# Patient Record
Sex: Female | Born: 1955 | Race: White | Hispanic: No | Marital: Single | Smoking: Never smoker
Health system: Southern US, Community
[De-identification: ages and names within clinical notes are randomized; demographics above are authoritative.]

## PROBLEM LIST (undated history)

## (undated) DIAGNOSIS — F329 Major depressive disorder, single episode, unspecified: Secondary | ICD-10-CM

## (undated) DIAGNOSIS — F32A Depression, unspecified: Secondary | ICD-10-CM

## (undated) DIAGNOSIS — F419 Anxiety disorder, unspecified: Secondary | ICD-10-CM

## (undated) DIAGNOSIS — I10 Essential (primary) hypertension: Secondary | ICD-10-CM

## (undated) DIAGNOSIS — E119 Type 2 diabetes mellitus without complications: Secondary | ICD-10-CM

## (undated) DIAGNOSIS — M1990 Unspecified osteoarthritis, unspecified site: Secondary | ICD-10-CM

## (undated) HISTORY — PX: TUBAL LIGATION: SHX77

## (undated) HISTORY — PX: NASAL SEPTUM SURGERY: SHX37

---

## 2005-12-04 ENCOUNTER — Emergency Department (HOSPITAL_COMMUNITY): Admission: EM | Admit: 2005-12-04 | Discharge: 2005-12-04 | Payer: Self-pay | Admitting: Emergency Medicine

## 2006-06-08 ENCOUNTER — Ambulatory Visit: Payer: Self-pay | Admitting: Family Medicine

## 2006-06-22 ENCOUNTER — Ambulatory Visit: Payer: Self-pay | Admitting: Family Medicine

## 2007-09-05 ENCOUNTER — Encounter: Admission: RE | Admit: 2007-09-05 | Discharge: 2007-12-04 | Payer: Self-pay

## 2008-08-27 ENCOUNTER — Ambulatory Visit: Payer: Self-pay | Admitting: Gastroenterology

## 2011-02-06 ENCOUNTER — Emergency Department (HOSPITAL_COMMUNITY)
Admission: EM | Admit: 2011-02-06 | Discharge: 2011-02-06 | Disposition: A | Discharge status: Home / Self Care | Payer: Managed Care, Other (non HMO) | Attending: Emergency Medicine | Admitting: Emergency Medicine

## 2011-02-06 ENCOUNTER — Emergency Department (HOSPITAL_COMMUNITY): Payer: Managed Care, Other (non HMO)

## 2011-02-06 DIAGNOSIS — M25569 Pain in unspecified knee: Secondary | ICD-10-CM | POA: Insufficient documentation

## 2011-02-06 DIAGNOSIS — M545 Low back pain, unspecified: Secondary | ICD-10-CM | POA: Insufficient documentation

## 2011-02-06 DIAGNOSIS — E119 Type 2 diabetes mellitus without complications: Secondary | ICD-10-CM | POA: Insufficient documentation

## 2011-02-06 DIAGNOSIS — R209 Unspecified disturbances of skin sensation: Secondary | ICD-10-CM | POA: Insufficient documentation

## 2011-02-06 DIAGNOSIS — Q762 Congenital spondylolisthesis: Secondary | ICD-10-CM | POA: Insufficient documentation

## 2011-02-06 DIAGNOSIS — R29898 Other symptoms and signs involving the musculoskeletal system: Secondary | ICD-10-CM | POA: Insufficient documentation

## 2011-10-08 ENCOUNTER — Emergency Department (HOSPITAL_COMMUNITY)
Admission: EM | Admit: 2011-10-08 | Discharge: 2011-10-09 | Disposition: A | Discharge status: Other Acute Inpatient Hospital | Payer: Self-pay | Attending: Emergency Medicine | Admitting: Emergency Medicine

## 2011-10-08 DIAGNOSIS — E119 Type 2 diabetes mellitus without complications: Secondary | ICD-10-CM | POA: Insufficient documentation

## 2011-10-08 DIAGNOSIS — R45851 Suicidal ideations: Secondary | ICD-10-CM | POA: Insufficient documentation

## 2011-10-08 DIAGNOSIS — F411 Generalized anxiety disorder: Secondary | ICD-10-CM | POA: Insufficient documentation

## 2011-10-08 LAB — RAPID URINE DRUG SCREEN, HOSP PERFORMED
Amphetamines: NOT DETECTED
Barbiturates: NOT DETECTED
Benzodiazepines: NOT DETECTED
Cocaine: NOT DETECTED
Opiates: NOT DETECTED
Tetrahydrocannabinol: NOT DETECTED

## 2011-10-08 LAB — BASIC METABOLIC PANEL
BUN: 11 mg/dL (ref 6–23)
Chloride: 96 mEq/L (ref 96–112)
Creatinine, Ser: <0.47 mg/dL — ABNORMAL LOW (ref 0.50–1.10)
Glucose, Bld: 186 mg/dL — ABNORMAL HIGH (ref 70–99)

## 2011-10-08 LAB — DIFFERENTIAL
Basophils Relative: 1 % (ref 0–1)
Eosinophils Absolute: 0 10*3/uL (ref 0.0–0.7)
Lymphs Abs: 1.4 10*3/uL (ref 0.7–4.0)
Monocytes Absolute: 0.3 10*3/uL (ref 0.1–1.0)
Monocytes Relative: 7 % (ref 3–12)

## 2011-10-08 LAB — ACETAMINOPHEN LEVEL: Acetaminophen (Tylenol), Serum: <15 ug/mL (ref 10–30)

## 2011-10-08 LAB — CBC
Hemoglobin: 13.3 g/dL (ref 12.0–15.0)
MCH: 29.6 pg (ref 26.0–34.0)
MCHC: 34.9 g/dL (ref 30.0–36.0)
MCV: 84.9 fL (ref 78.0–100.0)

## 2011-10-09 ENCOUNTER — Inpatient Hospital Stay (HOSPITAL_COMMUNITY)
Admission: AD | Admit: 2011-10-09 | Discharge: 2011-10-13 | DRG: 885 | Disposition: A | Discharge status: Home / Self Care | Payer: 59 | Source: Ambulatory Visit | Attending: Psychiatry | Admitting: Psychiatry

## 2011-10-09 DIAGNOSIS — F339 Major depressive disorder, recurrent, unspecified: Principal | ICD-10-CM

## 2011-10-09 DIAGNOSIS — Z560 Unemployment, unspecified: Secondary | ICD-10-CM

## 2011-10-09 DIAGNOSIS — F411 Generalized anxiety disorder: Secondary | ICD-10-CM

## 2011-10-09 DIAGNOSIS — M1990 Unspecified osteoarthritis, unspecified site: Secondary | ICD-10-CM

## 2011-10-09 DIAGNOSIS — I10 Essential (primary) hypertension: Secondary | ICD-10-CM

## 2011-10-09 DIAGNOSIS — E119 Type 2 diabetes mellitus without complications: Secondary | ICD-10-CM

## 2011-10-09 DIAGNOSIS — R45851 Suicidal ideations: Secondary | ICD-10-CM

## 2011-10-09 DIAGNOSIS — Z79899 Other long term (current) drug therapy: Secondary | ICD-10-CM

## 2011-10-09 LAB — GLUCOSE, CAPILLARY

## 2011-10-10 DIAGNOSIS — F339 Major depressive disorder, recurrent, unspecified: Secondary | ICD-10-CM

## 2011-10-10 DIAGNOSIS — F411 Generalized anxiety disorder: Secondary | ICD-10-CM

## 2011-10-10 LAB — GLUCOSE, CAPILLARY: Glucose-Capillary: 205 mg/dL — ABNORMAL HIGH (ref 70–99)

## 2011-10-11 LAB — GLUCOSE, CAPILLARY
Glucose-Capillary: 199 mg/dL — ABNORMAL HIGH (ref 70–99)
Glucose-Capillary: 266 mg/dL — ABNORMAL HIGH (ref 70–99)

## 2011-10-12 LAB — GLUCOSE, CAPILLARY
Glucose-Capillary: 203 mg/dL — ABNORMAL HIGH (ref 70–99)
Glucose-Capillary: 258 mg/dL — ABNORMAL HIGH (ref 70–99)

## 2011-10-12 NOTE — Assessment & Plan Note (Signed)
Jaclyn [name], [name] NO.:  [id number]  MEDICAL RECORD NO.:  [id number]  LOCATION:  0503                          FACILITY:  [facility]  PHYSICIAN:  Franchot Gallo, MD     DATE OF BIRTH:  11-09-1956  DATE OF ADMISSION:  10/09/2011 DATE OF DISCHARGE:                      PSYCHIATRIC ADMISSION ASSESSMENT   IDENTIFYING INFORMATION:  This is a voluntary admission to the services of Dr. Harvie Heck Jaclyn [name].  This is a 55 year old divorced white female. She presented to the ED at [facility].  She reported that she had started having an anxiety attack the day before.  She becomes very violent.  She tore up a phone yesterday.  She also feels like her heart is racing.  She stated, "I think of suicide a lot."  The patient took 3 tabs of her fluoxetine yesterday, but she is prescribed 60 mg a day, and 4 this morning.  Apparently, her father died on 09/24/23 from Huntington's disease, and she was laid off from her job June 28th.  She has been unable to secure any more employment and just feels cornered.  PAST PSYCHIATRIC HISTORY:  She has had no prior inpatient history.  Her primary care gives her the Prozac, and she does have an intake scheduled with [facility], but it is not until January.  SOCIAL HISTORY:  She has an associate's degree.  She has been married and divorced twice.  She has 1 daughter 78.  She just lost her employment June 25, 2011.  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  FAMILY HISTORY:  Psychiatrically, she denies.  She is still grieving her father's death on Sep 27, 2024from Huntington's.  ALCOHOL AND DRUG HISTORY:  She denies.  PRIMARY CARE PROVIDER:  Dr. Collins Scotland.  MEDICAL PROBLEMS:  She was diagnosed with type 2 diabetes about 2-3 years ago, and she also has hypertension.  CURRENTLY PRESCRIBED MEDICATIONS: 1. Metformin 500 mg p.o. daily. 2. Fluoxetine 60 mg p.o. daily. 3. Enalapril maleate 10/25 mg, 1 p.o. daily for  hypertension.  ALLERGIES:  No known drug allergies.  PHYSICAL EXAMINATION:  GENERAL:  She is a petite female who appears anxious. VITAL SIGNS/LABORATORIES:  Her vital signs showed that her temperature was afebrile, 97.3 to 98.6.  Her respirations were 16-20.  Her pulse was 69-95, and her blood pressure was 118/75 to 149/88.  She had no abnormalities of CBC.  Her B-met showed her glucose was elevated at 186, and her creatinine was a little bit low at 0.47.  She had no alcohol and no drugs in her urine.  MENTAL STATUS EXAM:  Tonight she is alert and oriented.  She is casually groomed and dressed.  Her speech was not pressured.  Her mood is anxiously depressed.  Her affect is congruent.  Her thought processes are clear, rational, goal oriented.  She realizes that it does not make any sense to be tearing up things when you cannot afford to replace them in the first place.  Concentration and memory are superficially intact. Intelligence is at least average.  She denies being suicidal or homicidal.  She denies auditory or visual hallucinations.  She is just very frustrated.  She  just feels out of control since she cannot find a job.  DIAGNOSES:  Axis I: 1. Major depressive disorder, single episode, severe, without     psychotic features. 2. Bereavement, father's death Sep 20, 2023. Axis II:  Deferred. Axis III:  Diabetes, hypertension, osteoarthritis. Axis IV:  Loss of employment and loss of father. Axis V:  GAF 30.  PLAN:  Admit for safety and stabilization.  Her medications will be adjusted as indicated, and we will have the case manager help identify community supports so her lights do not get turned off and things like this.  Estimated length of stay is 5 days.     Mickie Leonarda Salon, P.A.-C.   ______________________________ Franchot Gallo, MD    MD/MEDQ  D:  10/09/2011  T:  10/09/2011  Job:  [id number]  Electronically Signed by Jaci Lazier ADAMS P.A.-C. on 10/12/2011 11:50:54  AM Electronically Signed by Franchot Gallo MD on 10/12/2011 01:25:24 PM

## 2011-10-14 ENCOUNTER — Emergency Department (HOSPITAL_COMMUNITY)
Admission: EM | Admit: 2011-10-14 | Discharge: 2011-10-15 | Disposition: A | Discharge status: Home / Self Care | Payer: Managed Care, Other (non HMO) | Attending: Emergency Medicine | Admitting: Emergency Medicine

## 2011-10-14 DIAGNOSIS — R1031 Right lower quadrant pain: Secondary | ICD-10-CM | POA: Insufficient documentation

## 2011-10-14 DIAGNOSIS — F411 Generalized anxiety disorder: Secondary | ICD-10-CM | POA: Insufficient documentation

## 2011-10-14 DIAGNOSIS — M1990 Unspecified osteoarthritis, unspecified site: Secondary | ICD-10-CM | POA: Insufficient documentation

## 2011-10-14 DIAGNOSIS — N390 Urinary tract infection, site not specified: Secondary | ICD-10-CM | POA: Insufficient documentation

## 2011-10-14 DIAGNOSIS — K625 Hemorrhage of anus and rectum: Secondary | ICD-10-CM | POA: Insufficient documentation

## 2011-10-14 DIAGNOSIS — E119 Type 2 diabetes mellitus without complications: Secondary | ICD-10-CM | POA: Insufficient documentation

## 2011-10-14 DIAGNOSIS — R1032 Left lower quadrant pain: Secondary | ICD-10-CM | POA: Insufficient documentation

## 2011-10-15 LAB — DIFFERENTIAL
Basophils Absolute: 0 10*3/uL (ref 0.0–0.1)
Eosinophils Absolute: 0.1 10*3/uL (ref 0.0–0.7)
Eosinophils Relative: 1 % (ref 0–5)
Neutrophils Relative %: 66 % (ref 43–77)

## 2011-10-15 LAB — URINE MICROSCOPIC-ADD ON

## 2011-10-15 LAB — BASIC METABOLIC PANEL
Chloride: 95 mEq/L — ABNORMAL LOW (ref 96–112)
GFR calc Af Amer: >90 mL/min (ref >90)
Potassium: 3.4 mEq/L — ABNORMAL LOW (ref 3.5–5.1)
Sodium: 136 mEq/L (ref 135–145)

## 2011-10-15 LAB — URINALYSIS, ROUTINE W REFLEX MICROSCOPIC
Glucose, UA: 500 mg/dL — AB
Hgb urine dipstick: NEGATIVE
Protein, ur: NEGATIVE mg/dL
Specific Gravity, Urine: 1.016 (ref 1.005–1.030)
pH: 6 (ref 5.0–8.0)

## 2011-10-15 LAB — CBC
Platelets: 239 10*3/uL (ref 150–400)
RDW: 12.4 % (ref 11.5–15.5)
WBC: 7.9 10*3/uL (ref 4.0–10.5)

## 2011-10-15 LAB — OCCULT BLOOD, POC DEVICE: Fecal Occult Bld: POSITIVE

## 2011-10-15 LAB — GLUCOSE, CAPILLARY: Glucose-Capillary: 192 mg/dL — ABNORMAL HIGH (ref 70–99)

## 2011-10-15 NOTE — Discharge Summary (Signed)
  Jaclyn [name], Jaclyn [name] NO.:  [id number]  MEDICAL RECORD NO.:  [id number]  LOCATION:                                 FACILITY:  PHYSICIAN:  Jaclyn Gallo, MD     DATE OF BIRTH:  07-Mar-1956  DATE OF ADMISSION:  10/09/2011 DATE OF DISCHARGE:  10/13/2011                              DISCHARGE SUMMARY   REASON FOR ADMISSION:  This is a 55 year old female that was admitted for anxiety and thinking of suicide "a lot."  She recently lost her father and was laid off from her job this past summer.  FINAL IMPRESSION:  AXIS I.  Major depressive disorder, recurrent, generalized anxiety disorder. AXIS II.  Deferred. AXIS III.  History of diabetes, hypertension, osteoarthritis. AXIS IV.  Limited primary support, financial constraints, recent loss of father. AXIS V.  GAF at discharge is 70.  LABS:  CBC within normal limits.  Glucose elevated at 186, alcohol level negligible.  Urine drug screen negative.  SIGNIFICANT FINDINGS:  The patient was admitted to the adult milieu for safety and stabilization.  We will provide the patient with resources, monitor her blood sugars and continued her Prozac.  We started the patient on low-dose Klonopin to help her with anxiety.  She was feeling sedated on it, and we decreased the dose to 2.5 b.i.d.  We had contact with her daughter to address any safety issues and for [place] to provide information.  Her daughter had no concerns about her coming home. The patient was reporting her racing thoughts were improving.  She still endorsed problems with decreased energy, hopelessness was rated at 3 on a scale of 1-10.  Denied any suicidal or homicidal thoughts or psychotic symptoms.  She did report that her anxiety was worse over the last 3 months.  On day of discharge, the patient was seen in the interdisciplinary treatment team.  She was fully alert and cooperative, appreciative of the help she received.  She reported good sleep,  good appetite.  Her depression had resolved, rating it a 1 on a scale of 1- 10.  Denied any suicidal or homicidal thoughts or psychotic symptoms. Her anxiety was rated a 2 on a scale of 1-10, hopelessness as 0 on a scale of 1-10 and denied any racing thoughts.  HER DISCHARGE MEDICATIONS:  Included: 1. Klonopin 0.5 mg taking 1/2 a tablet twice a day as needed for     anxiety. 2. Metformin 500 mg 1 daily. 3. Quetiapine 25 mg at bedtime. 4. Enalapril/hydrochlorothiazide 10/25 one daily. 5. Fluoxetine 20 mg taking 3 daily. Her follow up appointment was with at [facility], phone #[phone]. There was a referral made to hospice in [facility], phone [phone].     Jaclyn [name], N.P.   ______________________________ Jaclyn Gallo, MD    JO/MEDQ  D:  10/13/2011  T:  10/14/2011  Job:  [id number]  Electronically Signed by Limmie PatriciaP. on 10/15/2011 09:25:39 AM Electronically Signed by Jaclyn Gallo MD on 10/15/2011 01:10:56 PM

## 2012-11-09 ENCOUNTER — Inpatient Hospital Stay (HOSPITAL_COMMUNITY)
Admission: EM | Admit: 2012-11-09 | Discharge: 2012-11-12 | DRG: 918 | Disposition: A | Discharge status: Other Acute Inpatient Hospital | Payer: MEDICAID | Attending: Internal Medicine | Admitting: Internal Medicine

## 2012-11-09 ENCOUNTER — Encounter (HOSPITAL_COMMUNITY): Payer: Self-pay | Admitting: *Deleted

## 2012-11-09 DIAGNOSIS — E876 Hypokalemia: Secondary | ICD-10-CM | POA: Diagnosis present

## 2012-11-09 DIAGNOSIS — T50901A Poisoning by unspecified drugs, medicaments and biological substances, accidental (unintentional), initial encounter: Secondary | ICD-10-CM

## 2012-11-09 DIAGNOSIS — R9431 Abnormal electrocardiogram [ECG] [EKG]: Secondary | ICD-10-CM

## 2012-11-09 DIAGNOSIS — F32A Depression, unspecified: Secondary | ICD-10-CM

## 2012-11-09 DIAGNOSIS — F3289 Other specified depressive episodes: Secondary | ICD-10-CM | POA: Diagnosis present

## 2012-11-09 DIAGNOSIS — T50911A Poisoning by multiple unspecified drugs, medicaments and biological substances, accidental (unintentional), initial encounter: Secondary | ICD-10-CM | POA: Diagnosis present

## 2012-11-09 DIAGNOSIS — R4589 Other symptoms and signs involving emotional state: Secondary | ICD-10-CM

## 2012-11-09 DIAGNOSIS — E1165 Type 2 diabetes mellitus with hyperglycemia: Secondary | ICD-10-CM

## 2012-11-09 DIAGNOSIS — G249 Dystonia, unspecified: Secondary | ICD-10-CM

## 2012-11-09 DIAGNOSIS — I10 Essential (primary) hypertension: Secondary | ICD-10-CM

## 2012-11-09 DIAGNOSIS — Z79899 Other long term (current) drug therapy: Secondary | ICD-10-CM

## 2012-11-09 DIAGNOSIS — E119 Type 2 diabetes mellitus without complications: Secondary | ICD-10-CM

## 2012-11-09 DIAGNOSIS — M129 Arthropathy, unspecified: Secondary | ICD-10-CM | POA: Diagnosis present

## 2012-11-09 DIAGNOSIS — T43502A Poisoning by unspecified antipsychotics and neuroleptics, intentional self-harm, initial encounter: Secondary | ICD-10-CM | POA: Diagnosis present

## 2012-11-09 DIAGNOSIS — T50904A Poisoning by unspecified drugs, medicaments and biological substances, undetermined, initial encounter: Secondary | ICD-10-CM

## 2012-11-09 DIAGNOSIS — Z23 Encounter for immunization: Secondary | ICD-10-CM

## 2012-11-09 DIAGNOSIS — IMO0001 Reserved for inherently not codable concepts without codable children: Secondary | ICD-10-CM | POA: Diagnosis present

## 2012-11-09 DIAGNOSIS — R259 Unspecified abnormal involuntary movements: Secondary | ICD-10-CM

## 2012-11-09 DIAGNOSIS — T43224A Poisoning by selective serotonin reuptake inhibitors, undetermined, initial encounter: Secondary | ICD-10-CM | POA: Diagnosis present

## 2012-11-09 DIAGNOSIS — F329 Major depressive disorder, single episode, unspecified: Secondary | ICD-10-CM | POA: Diagnosis present

## 2012-11-09 DIAGNOSIS — IMO0002 Reserved for concepts with insufficient information to code with codable children: Secondary | ICD-10-CM

## 2012-11-09 DIAGNOSIS — T50902A Poisoning by unspecified drugs, medicaments and biological substances, intentional self-harm, initial encounter: Secondary | ICD-10-CM

## 2012-11-09 DIAGNOSIS — G2402 Drug induced acute dystonia: Secondary | ICD-10-CM | POA: Diagnosis present

## 2012-11-09 DIAGNOSIS — T68XXXA Hypothermia, initial encounter: Secondary | ICD-10-CM | POA: Diagnosis present

## 2012-11-09 DIAGNOSIS — R410 Disorientation, unspecified: Secondary | ICD-10-CM

## 2012-11-09 DIAGNOSIS — X31XXXA Exposure to excessive natural cold, initial encounter: Secondary | ICD-10-CM | POA: Diagnosis present

## 2012-11-09 DIAGNOSIS — F19921 Other psychoactive substance use, unspecified with intoxication with delirium: Secondary | ICD-10-CM | POA: Diagnosis present

## 2012-11-09 DIAGNOSIS — T43501A Poisoning by unspecified antipsychotics and neuroleptics, accidental (unintentional), initial encounter: Principal | ICD-10-CM | POA: Diagnosis present

## 2012-11-09 HISTORY — DX: Other symptoms and signs involving emotional state: R45.89

## 2012-11-09 HISTORY — DX: Type 2 diabetes mellitus without complications: E11.9

## 2012-11-09 HISTORY — DX: Abnormal electrocardiogram (ECG) (EKG): R94.31

## 2012-11-09 HISTORY — DX: Unspecified osteoarthritis, unspecified site: M19.90

## 2012-11-09 HISTORY — DX: Essential (primary) hypertension: I10

## 2012-11-09 HISTORY — DX: Depression, unspecified: F32.A

## 2012-11-09 HISTORY — DX: Major depressive disorder, single episode, unspecified: F32.9

## 2012-11-09 LAB — RAPID URINE DRUG SCREEN, HOSP PERFORMED
Benzodiazepines: NOT DETECTED
Cocaine: NOT DETECTED
Opiates: NOT DETECTED

## 2012-11-09 LAB — BASIC METABOLIC PANEL
Chloride: 98 mEq/L (ref 96–112)
Creatinine, Ser: 0.72 mg/dL (ref 0.50–1.10)
GFR calc Af Amer: >90 mL/min (ref >90)
Potassium: 3.4 mEq/L — ABNORMAL LOW (ref 3.5–5.1)
Sodium: 137 mEq/L (ref 135–145)

## 2012-11-09 LAB — MAGNESIUM: Magnesium: 1.8 mg/dL (ref 1.5–2.5)

## 2012-11-09 LAB — CBC WITH DIFFERENTIAL/PLATELET
Basophils Absolute: 0 10*3/uL (ref 0.0–0.1)
HCT: 34 % — ABNORMAL LOW (ref 36.0–46.0)
Hemoglobin: 11.2 g/dL — ABNORMAL LOW (ref 12.0–15.0)
Lymphocytes Relative: 7 % — ABNORMAL LOW (ref 12–46)
Monocytes Absolute: 0.7 10*3/uL (ref 0.1–1.0)
Neutro Abs: 12.8 10*3/uL — ABNORMAL HIGH (ref 1.7–7.7)
RDW: 12.9 % (ref 11.5–15.5)
WBC: 14.5 10*3/uL — ABNORMAL HIGH (ref 4.0–10.5)

## 2012-11-09 LAB — URINALYSIS, ROUTINE W REFLEX MICROSCOPIC
Leukocytes, UA: NEGATIVE
Nitrite: NEGATIVE
Specific Gravity, Urine: >1.03 — ABNORMAL HIGH (ref 1.005–1.030)
pH: 5.5 (ref 5.0–8.0)

## 2012-11-09 LAB — GLUCOSE, CAPILLARY: Glucose-Capillary: 335 mg/dL — ABNORMAL HIGH (ref 70–99)

## 2012-11-09 MED ORDER — LORAZEPAM 2 MG/ML IJ SOLN
INTRAMUSCULAR | Status: AC
  Filled 2012-11-09: qty 1

## 2012-11-09 MED ORDER — SODIUM CHLORIDE 0.9 % IV SOLN
INTRAVENOUS | Status: DC
  Administered 2012-11-10: 01:00:00 via INTRAVENOUS
  Filled 2012-11-09 (×6): qty 1000

## 2012-11-09 MED ORDER — MAGNESIUM SULFATE 40 MG/ML IJ SOLN
2.0000 g | Freq: Once | INTRAMUSCULAR | Status: AC
  Administered 2012-11-10: 2 g via INTRAVENOUS
  Filled 2012-11-09: qty 50

## 2012-11-09 MED ORDER — POTASSIUM CHLORIDE IN NACL 20-0.9 MEQ/L-% IV SOLN
INTRAVENOUS | Status: AC
  Administered 2012-11-10: 1000 mL
  Filled 2012-11-09: qty 1000

## 2012-11-09 MED ORDER — LORAZEPAM 2 MG/ML IJ SOLN
1.0000 mg | Freq: Once | INTRAMUSCULAR | Status: AC
  Administered 2012-11-09: 1 mg via INTRAVENOUS

## 2012-11-09 MED ORDER — IBUPROFEN 800 MG PO TABS
800.0000 mg | ORAL_TABLET | Freq: Once | ORAL | Status: AC
  Administered 2012-11-09: 800 mg via ORAL
  Filled 2012-11-09: qty 1

## 2012-11-09 MED ORDER — SODIUM CHLORIDE 0.9 % IV SOLN
1000.0000 mL | INTRAVENOUS | Status: DC
  Administered 2012-11-09: 1000 mL via INTRAVENOUS

## 2012-11-09 MED ORDER — SODIUM CHLORIDE 0.9 % IV SOLN
1000.0000 mL | Freq: Once | INTRAVENOUS | Status: AC
  Administered 2012-11-09: 1000 mL via INTRAVENOUS

## 2012-11-09 MED ORDER — LORAZEPAM 2 MG/ML IJ SOLN
1.0000 mg | Freq: Once | INTRAMUSCULAR | Status: AC
  Administered 2012-11-09: 1 mg via INTRAVENOUS
  Filled 2012-11-09: qty 1

## 2012-11-09 MED ORDER — SODIUM CHLORIDE 0.9 % IV BOLUS (SEPSIS)
1000.0000 mL | Freq: Once | INTRAVENOUS | Status: AC
  Administered 2012-11-10: 1000 mL via INTRAVENOUS

## 2012-11-09 MED ORDER — POTASSIUM CHLORIDE 10 MEQ/100ML IV SOLN
10.0000 meq | INTRAVENOUS | Status: AC
  Administered 2012-11-09 – 2012-11-10 (×2): 10 meq via INTRAVENOUS
  Filled 2012-11-09 (×2): qty 100

## 2012-11-09 MED ORDER — LORAZEPAM 2 MG/ML IJ SOLN
2.0000 mg | Freq: Once | INTRAMUSCULAR | Status: AC
  Administered 2012-11-09: 2 mg via INTRAVENOUS

## 2012-11-09 MED ORDER — LORAZEPAM 2 MG/ML IJ SOLN: 2.0000 mg | Freq: Once | INTRAMUSCULAR | Status: DC

## 2012-11-09 NOTE — ED Notes (Signed)
Pt placed under bear hugger to increase body temp & warm IV fluids started.

## 2012-11-09 NOTE — ED Notes (Signed)
Reported pt took a handful of Prozac & Seroquel around 1600 this afternoon & then when into the woods. EMS states pt pale & cool when they got to pt. Pt warmed & color returned. Pt denies ETOH.

## 2012-11-09 NOTE — ED Notes (Signed)
Pt is restless & anxious. States something is wrong but does not know what.

## 2012-11-09 NOTE — ED Notes (Signed)
Pt remains on cardiac monitor w/ NIBP vital signs WNL. NAD noted. No needs voiced at this time. 

## 2012-11-09 NOTE — BH Assessment (Signed)
Assessment Note   Jaclyn [name] is an 56 y.o. female. ACT called to evaluate pt who presented with an intentional overdose with admitted suicidal intent.  When ACT arrived around 2215, pt was very restless and somewhat incoherent.  ACT unable to obtain information from pt.  Dr Patria Mane of [facility] came back to evaluate pt and said that she was much more coherent when she first came to ED earlier tonight. Pt's daughter, Charlynne Cousins was contacted and provided all the information on this assessment.  She reported that she lives with pt and they have been under pretty significant financial strain for quite some time now.  Tonight pt became upset about these financial issues and told Ciara that she was "done" and proceeded to take roughly 20 seroquel tablets and between 15 and 20 prozac.  Pt then left the home.  Daughter called 911 and pt was located 2 hours later in the woods and brought to [facility].  Daughter reports pt has seemed depressed for a few months lately.  Pt does go to [facility] for meds.  In august a med change was made: prozac was stopped and effexor was started due to pt feeling to drowsy during the day.  No reported use of drugs or alcohol.  UDS/BAC both negative.  Pt was hospitalized in 09/2011 at [facility] for depression/SI. Daughter reports this was first psych admit for pt.  Axis I: Major Depression, Recurrent severe Axis II: Deferred Axis III:  Past Medical History  Diagnosis Date  . Diabetes mellitus without complication   . Hypertension   . Arthritis   . Depression    Axis IV: economic problems Axis V: 21-30 behavior considerably influenced by delusions or hallucinations OR serious impairment in judgment, communication OR inability to function in almost all areas  Past Medical History:  Past Medical History  Diagnosis Date  . Diabetes mellitus without complication   . Hypertension   . Arthritis   . Depression     Past Surgical History  Procedure Date  . Tubal ligation   . Nasal  septum surgery     Family History: No family history on file.  Social History:  reports that she has never smoked. She does not have any smokeless tobacco history on file. She reports that she does not drink alcohol or use illicit drugs.  Additional Social History:  Alcohol / Drug Use Pain Medications: Daughter denies any drug or alcohol use by pt.  UDS/BAC both negative. History of alcohol / drug use?: No history of alcohol / drug abuse  CIWA: CIWA-Ar BP: 151/65 mmHg Pulse Rate: 139  COWS:    Allergies:  Allergies  Allergen Reactions  . Codeine Nausea And Vomiting    States that she cannot take in high doses  . Morphine And Related Nausea And Vomiting    States that she cannot take in high doses    Home Medications:  (Not in a hospital admission)  OB/GYN Status:  No LMP recorded. Patient is postmenopausal.  General Assessment Data Location of Assessment: [facility] ACT Assessment: Yes Living Arrangements: Children Can pt return to current living arrangement?: Yes     Risk to self Suicidal Ideation: Yes-Currently Present Suicidal Intent: Yes-Currently Present Is patient at risk for suicide?: Yes Suicidal Plan?: Yes-Currently Present Specify Current Suicidal Plan: overdose Access to Means: Yes Specify Access to Suicidal Means: own pills What has been your use of drugs/alcohol within the last 12 months?: no current use Previous Attempts/Gestures: No Intentional Self Injurious Behavior:  None Family Suicide History: No Recent stressful life event(s): Financial Problems Persecutory voices/beliefs?: No Depression: Yes Depression Symptoms:  (unable to assess at this time) Substance abuse history and/or treatment for substance abuse?: No Suicide prevention information given to non-admitted patients: Not applicable  Risk to Others Homicidal Ideation: No Thoughts of Harm to Others: No Current Homicidal Intent: No Current Homicidal Plan: No Access to Homicidal Means:  No History of harm to others?: No Assessment of Violence: None Noted Does patient have access to weapons?: No Criminal Charges Pending?: No Does patient have a court date: No  Psychosis Hallucinations: None noted Delusions: None noted  Mental Status Report Appear/Hygiene: Disheveled Eye Contact: Poor Motor Activity: Restlessness Speech: Soft;Incoherent Level of Consciousness: Restless Mood:  (unable to assess) Affect: Unable to Assess Anxiety Level: Moderate Thought Processes:  (unable to assess) Orientation: Unable to assess  Cognitive Functioning Appetite: Poor Weight Loss: 10  Weight Gain: 0  Sleep: No Change Vegetative Symptoms: None     Abuse/Neglect [facility]) Physical Abuse: Denies Verbal Abuse: Denies Sexual Abuse: Denies  Prior Inpatient Therapy Prior Inpatient Therapy: Yes Prior Therapy Dates: 09/2011 Prior Therapy Facilty/Provider(s): [facility] Reason for Treatment: psych  Prior Outpatient Therapy Prior Outpatient Therapy: Yes Prior Therapy Dates: current Prior Therapy Facilty/Provider(s): [facility] Reason for Treatment: meds/psych          Abuse/Neglect Assessment (Assessment to be complete while patient is alone) Physical Abuse: Denies Verbal Abuse: Denies Sexual Abuse: Denies Values / Beliefs Cultural Requests During Hospitalization: None Spiritual Requests During Hospitalization: None   Advance Directives (For Healthcare) Advance Directive: Patient does not have advance directive    Additional Information 1:1 In Past 12 Months?: No Does patient have medical clearance?: No     Disposition: Discussed pt with Dr Patria Mane of [facility].  Pt mental status has deteriorated since she first presented in the ED and Dr Patria Mane is going to further evaluate pt for medical clearance at this time.  Pt will need psych admit once clear medically.    On Site Evaluation by:   Reviewed with Physician:     Lorri Frederick 11/09/2012 10:53 PM

## 2012-11-09 NOTE — ED Notes (Signed)
Pt states she has been having a hard time at home. States she is unable to work, and may only get 1 meal a day. Pt states she is in pain from chronic conditions. States she thinks she did take medication in an attempt to harm self.

## 2012-11-09 NOTE — ED Notes (Signed)
Pt complaining of her back hurting. EDP notified.

## 2012-11-09 NOTE — ED Provider Notes (Signed)
History   This chart was scribed for Lyanne Co, MD by Sofie Rower, ED Scribe. The patient was seen in room [facility] and the patient's care was started at 7:43PM.     CSN: [id number]  Arrival date & time 11/09/12  4098   First MD Initiated Contact with Patient 11/09/12 1943      Chief Complaint  Patient presents with  . Drug Overdose  . Back Pain     The history is provided by the patient.    Jaclyn [name] is a 56 y.o. female , with a hx of anxiety and depression, who presents to the Emergency Department complaining of sudden, progressively worsening, suicidal ideation, onset today (11/09/12). The pt reports she has been going through some tough times lately and was trying to killer herself earlier this evening (11/09/12), by taking a handful of Prozac and Seroquel at 4:00PM. The pt has been placed within a behavioral health facility in the past, with regards to her anxiety related destructive behaviors.  The pt denies drinking any alcohol this evening.   The pt does not smoke or drink alcohol.   PCP is Dr. Collins Scotland.    Past Medical History  Diagnosis Date  . Diabetes mellitus without complication   . Hypertension   . Arthritis   . Depression     Past Surgical History  Procedure Date  . Tubal ligation   . Nasal septum surgery     No family history on file.  History  Substance Use Topics  . Smoking status: Never Smoker   . Smokeless tobacco: Not on file  . Alcohol Use: No    OB History    Grav Para Term Preterm Abortions TAB SAB Ect Mult Living                  Review of Systems  All other systems reviewed and are negative.    Allergies  Codeine and Morphine and related  Home Medications   Current Outpatient Rx  Name  Route  Sig  Dispense  Refill  . ENALAPRIL MALEATE 10 MG PO TABS   Oral   Take 10 mg by mouth daily.         Marland Kitchen FLUOXETINE HCL 20 MG PO CAPS   Oral   Take 20 mg by mouth daily.         . MELOXICAM 15 MG PO TABS   Oral  Take 15 mg by mouth daily. dosage not on bottle         . METFORMIN HCL 1000 MG PO TABS   Oral   Take 1,000 mg by mouth 2 (two) times daily with a meal.         . QUETIAPINE FUMARATE 25 MG PO TABS   Oral   Take 25 mg by mouth at bedtime.         . VENLAFAXINE HCL ER 75 MG PO CP24   Oral   Take 75 mg by mouth daily.           BP 109/67  Pulse 104  Temp 91.7 F (33.2 C) (Rectal)  Resp 19  Ht 5\' 3"  (1.6 m)  Wt 100 lb (45.36 kg)  BMI 17.71 kg/m2  SpO2 100%  Physical Exam  Nursing note and vitals reviewed. Constitutional: She is oriented to person, place, and time. She appears well-developed and well-nourished.  HENT:  Head: Normocephalic.  Eyes: EOM are normal.  Neck: Normal range of motion.  Pulmonary/Chest: Effort normal.  Abdominal: She exhibits no distension.  Musculoskeletal: Normal range of motion.  Neurological: She is alert and oriented to person, place, and time.    ED Course  Procedures (including critical care time)  DIAGNOSTIC STUDIES: Oxygen Saturation is 100% on room air, normal by my interpretation.    COORDINATION OF CARE:  8:19 PM- Treatment plan discussed with patient. Pt agrees with treatment.   9:06PM- Phone consultation with ACT team. Tammy Sours will becoming to evaluate patient.   10:27PM- Recheck. Pt is becoming more altered and tachycardic. Will contact Poison control at this time.   10:33PM- Phone consultation with Poison control. Advised that the symptoms are associated with overdose of Seroquel. Pt to be medically admitted.    10:53PM- Phone consultation with Dr. Vedia Coffer, Hospitalist. Pt hx and condition as well as hospital admission discussed. Dr. Orvan Falconer agrees to admit patient.   CRITICAL CARE Performed by: Lyanne Co Total critical care time: 32 Critical care time was exclusive of separately billable procedures and treating other patients. Critical care was necessary to treat or prevent imminent or life-threatening  deterioration. Critical care was time spent personally by me on the following activities: development of treatment plan with patient and/or surrogate as well as nursing, discussions with consultants, evaluation of patient's response to treatment, examination of patient, obtaining history from patient or surrogate, ordering and performing treatments and interventions, ordering and review of laboratory studies, ordering and review of radiographic studies, pulse oximetry and re-evaluation of patient's condition.   Results for orders placed during the hospital encounter of 11/09/12  BASIC METABOLIC PANEL      Component Value Range   Sodium 137  135 - 145 mEq/L   Potassium 3.4 (*) 3.5 - 5.1 mEq/L   Chloride 98  96 - 112 mEq/L   CO2 22  19 - 32 mEq/L   Glucose, Bld 276 (*) 70 - 99 mg/dL   BUN 24 (*) 6 - 23 mg/dL   Creatinine, Ser 9.52  0.50 - 1.10 mg/dL   Calcium 9.4  8.4 - 84.1 mg/dL   GFR calc non Af Amer >90  >90 mL/min   GFR calc Af Amer >90  >90 mL/min  CBC WITH DIFFERENTIAL      Component Value Range   WBC 14.5 (*) 4.0 - 10.5 K/uL   RBC 3.91  3.87 - 5.11 MIL/uL   Hemoglobin 11.2 (*) 12.0 - 15.0 g/dL   HCT 32.4 (*) 40.1 - 02.7 %   MCV 87.0  78.0 - 100.0 fL   MCH 28.6  26.0 - 34.0 pg   MCHC 32.9  30.0 - 36.0 g/dL   RDW 25.3  66.4 - 40.3 %   Platelets 395  150 - 400 K/uL   Neutrophils Relative 88 (*) 43 - 77 %   Neutro Abs 12.8 (*) 1.7 - 7.7 K/uL   Lymphocytes Relative 7 (*) 12 - 46 %   Lymphs Abs 1.1  0.7 - 4.0 K/uL   Monocytes Relative 5  3 - 12 %   Monocytes Absolute 0.7  0.1 - 1.0 K/uL   Eosinophils Relative 0  0 - 5 %   Eosinophils Absolute 0.0  0.0 - 0.7 K/uL   Basophils Relative 0  0 - 1 %   Basophils Absolute 0.0  0.0 - 0.1 K/uL  URINALYSIS, ROUTINE W REFLEX MICROSCOPIC      Component Value Range   Color, Urine YELLOW  YELLOW   APPearance CLEAR  CLEAR   Specific Gravity, Urine >1.030 (*)  1.005 - 1.030   pH 5.5  5.0 - 8.0   Glucose, UA 250 (*) NEGATIVE mg/dL   Hgb  urine dipstick NEGATIVE  NEGATIVE   Bilirubin Urine SMALL (*) NEGATIVE   Ketones, ur 40 (*) NEGATIVE mg/dL   Protein, ur NEGATIVE  NEGATIVE mg/dL   Urobilinogen, UA 0.2  0.0 - 1.0 mg/dL   Nitrite NEGATIVE  NEGATIVE   Leukocytes, UA NEGATIVE  NEGATIVE  URINE RAPID DRUG SCREEN (HOSP PERFORMED)      Component Value Range   Opiates NONE DETECTED  NONE DETECTED   Cocaine NONE DETECTED  NONE DETECTED   Benzodiazepines NONE DETECTED  NONE DETECTED   Amphetamines NONE DETECTED  NONE DETECTED   Tetrahydrocannabinol NONE DETECTED  NONE DETECTED   Barbiturates NONE DETECTED  NONE DETECTED  ETHANOL      Component Value Range   Alcohol, Ethyl (B) <11  0 - 11 mg/dL  SALICYLATE LEVEL      Component Value Range   Salicylate Lvl <2.0 (*) 2.8 - 20.0 mg/dL  ACETAMINOPHEN LEVEL      Component Value Range   Acetaminophen (Tylenol), Serum <15.0  10 - 30 ug/mL  GLUCOSE, CAPILLARY      Component Value Range   Glucose-Capillary 335 (*) 70 - 99 mg/dL     Date: 40/98/1191  Rate: 137  Rhythm: sinus tachycardia  QRS Axis: normal  Intervals: QTc 570  ST/T Wave abnormalities: normal  Conduction Disutrbances: none  Narrative Interpretation:   Old EKG Reviewed: changed from prior ecg      No results found.   1. Intentional drug overdose       MDM  10:48 PM The patient was to be seen and evaluated by the behavior health team.  However at this time the patient is becoming more agitated now she is tachycardic up in the 130s 140s.  I discussed her case with the [facility] and they recommend medical admission to a telemetry bed for this patient.  He reports that the majority of all of her Effexor secondary to cerebral itself.  They recommended treatment with IV fluids and benzodiazepines.  EKG pending.  10:58 PM QTC is 570.  Poison control's recommending replacement of her potassium at this point and to check her magnesium level.  I personally performed the services described in this  documentation, which was scribed in my presence. The recorded information has been reviewed and is accurate.      Lyanne Co, MD 11/09/12 [phone]

## 2012-11-09 NOTE — ED Notes (Signed)
Patient is not alert at this time. Screaming out, flaying her arms and legs and pulling covers off.

## 2012-11-10 DIAGNOSIS — T50905A Adverse effect of unspecified drugs, medicaments and biological substances, initial encounter: Secondary | ICD-10-CM | POA: Diagnosis present

## 2012-11-10 DIAGNOSIS — R410 Disorientation, unspecified: Secondary | ICD-10-CM | POA: Diagnosis present

## 2012-11-10 DIAGNOSIS — F329 Major depressive disorder, single episode, unspecified: Secondary | ICD-10-CM

## 2012-11-10 DIAGNOSIS — R9431 Abnormal electrocardiogram [ECG] [EKG]: Secondary | ICD-10-CM

## 2012-11-10 DIAGNOSIS — IMO0001 Reserved for inherently not codable concepts without codable children: Secondary | ICD-10-CM

## 2012-11-10 HISTORY — DX: Disorientation, unspecified: R41.0

## 2012-11-10 LAB — COMPREHENSIVE METABOLIC PANEL
Albumin: 2.8 g/dL — ABNORMAL LOW (ref 3.5–5.2)
Albumin: 3 g/dL — ABNORMAL LOW (ref 3.5–5.2)
BUN: 15 mg/dL (ref 6–23)
BUN: 10 mg/dL (ref 6–23)
Calcium: 7.9 mg/dL — ABNORMAL LOW (ref 8.4–10.5)
Chloride: 108 mEq/L (ref 96–112)
Creatinine, Ser: 0.55 mg/dL (ref 0.50–1.10)
GFR calc Af Amer: >90 mL/min (ref >90)
Glucose, Bld: 131 mg/dL — ABNORMAL HIGH (ref 70–99)
Potassium: 4.2 mEq/L (ref 3.5–5.1)
Sodium: 138 mEq/L (ref 135–145)
Total Bilirubin: 0.2 mg/dL — ABNORMAL LOW (ref 0.3–1.2)
Total Protein: 5.6 g/dL — ABNORMAL LOW (ref 6.0–8.3)
Total Protein: 5.8 g/dL — ABNORMAL LOW (ref 6.0–8.3)

## 2012-11-10 LAB — GLUCOSE, CAPILLARY
Glucose-Capillary: 90 mg/dL (ref 70–99)
Glucose-Capillary: 71 mg/dL (ref 70–99)
Glucose-Capillary: 117 mg/dL — ABNORMAL HIGH (ref 70–99)

## 2012-11-10 LAB — CBC
HCT: 26.4 % — ABNORMAL LOW (ref 36.0–46.0)
HCT: 29.9 % — ABNORMAL LOW (ref 36.0–46.0)
MCHC: 33 g/dL (ref 30.0–36.0)
MCHC: 32.4 g/dL (ref 30.0–36.0)
MCV: 86.3 fL (ref 78.0–100.0)
MCV: 86.9 fL (ref 78.0–100.0)
Platelets: 336 10*3/uL (ref 150–400)
RDW: 13.2 % (ref 11.5–15.5)
RDW: 13.2 % (ref 11.5–15.5)
WBC: 8.9 10*3/uL (ref 4.0–10.5)

## 2012-11-10 LAB — APTT: aPTT: 27 seconds (ref 24–37)

## 2012-11-10 LAB — HEMOGLOBIN A1C
Hgb A1c MFr Bld: 7.6 % — ABNORMAL HIGH (ref <5.7)
Mean Plasma Glucose: 171 mg/dL — ABNORMAL HIGH (ref <117)

## 2012-11-10 LAB — MRSA PCR SCREENING: MRSA by PCR: NEGATIVE

## 2012-11-10 LAB — PROTIME-INR: INR: 1.27 (ref 0.00–1.49)

## 2012-11-10 MED ORDER — METOPROLOL TARTRATE 1 MG/ML IV SOLN: 5.0000 mg | INTRAVENOUS | Status: DC | PRN

## 2012-11-10 MED ORDER — ONDANSETRON HCL 4 MG/2ML IJ SOLN: 4.0000 mg | Freq: Four times a day (QID) | INTRAMUSCULAR | Status: DC | PRN

## 2012-11-10 MED ORDER — LORAZEPAM 2 MG/ML IJ SOLN
1.0000 mg | INTRAMUSCULAR | Status: DC | PRN
  Administered 2012-11-10 – 2012-11-12 (×2): 1 mg via INTRAVENOUS
  Filled 2012-11-10 (×2): qty 1

## 2012-11-10 MED ORDER — ACETAMINOPHEN 325 MG PO TABS
650.0000 mg | ORAL_TABLET | ORAL | Status: DC | PRN
  Administered 2012-11-11 – 2012-11-12 (×2): 650 mg via ORAL
  Filled 2012-11-10 (×2): qty 2

## 2012-11-10 MED ORDER — POTASSIUM CHLORIDE IN NACL 20-0.9 MEQ/L-% IV SOLN
INTRAVENOUS | Status: DC
  Administered 2012-11-10: 08:00:00 via INTRAVENOUS
  Administered 2012-11-10: 1000 mL via INTRAVENOUS

## 2012-11-10 MED ORDER — INSULIN ASPART 100 UNIT/ML ~~LOC~~ SOLN
0.0000 [IU] | SUBCUTANEOUS | Status: DC
  Administered 2012-11-10: 1 [IU] via SUBCUTANEOUS
  Administered 2012-11-11 (×3): 2 [IU] via SUBCUTANEOUS
  Administered 2012-11-11 (×2): 1 [IU] via SUBCUTANEOUS
  Administered 2012-11-12: 2 [IU] via SUBCUTANEOUS
  Administered 2012-11-12: 1 [IU] via SUBCUTANEOUS

## 2012-11-10 MED ORDER — SODIUM CHLORIDE 0.9 % IJ SOLN
3.0000 mL | Freq: Two times a day (BID) | INTRAMUSCULAR | Status: DC
  Administered 2012-11-10 – 2012-11-11 (×2): 3 mL via INTRAVENOUS

## 2012-11-10 MED ORDER — ONDANSETRON HCL 4 MG PO TABS: 4.0000 mg | ORAL_TABLET | Freq: Four times a day (QID) | ORAL | Status: DC | PRN

## 2012-11-10 MED ORDER — CHLORHEXIDINE GLUCONATE 0.12 % MT SOLN
15.0000 mL | Freq: Two times a day (BID) | OROMUCOSAL | Status: DC
  Administered 2012-11-10 – 2012-11-12 (×4): 15 mL via OROMUCOSAL
  Filled 2012-11-10 (×4): qty 15

## 2012-11-10 MED ORDER — BISACODYL 10 MG RE SUPP: 10.0000 mg | Freq: Every day | RECTAL | Status: DC | PRN

## 2012-11-10 MED ORDER — ENOXAPARIN SODIUM 40 MG/0.4ML ~~LOC~~ SOLN
40.0000 mg | SUBCUTANEOUS | Status: DC
  Administered 2012-11-10 – 2012-11-12 (×3): 40 mg via SUBCUTANEOUS
  Filled 2012-11-10 (×4): qty 0.4

## 2012-11-10 MED ORDER — FLEET ENEMA 7-19 GM/118ML RE ENEM: 1.0000 | ENEMA | Freq: Once | RECTAL | Status: AC | PRN

## 2012-11-10 MED ORDER — BIOTENE DRY MOUTH MT LIQD
15.0000 mL | Freq: Two times a day (BID) | OROMUCOSAL | Status: DC
  Administered 2012-11-10 – 2012-11-11 (×3): 15 mL via OROMUCOSAL

## 2012-11-10 MED ORDER — ACETAMINOPHEN 650 MG RE SUPP: 650.0000 mg | RECTAL | Status: DC | PRN

## 2012-11-10 MED ORDER — PANTOPRAZOLE SODIUM 40 MG IV SOLR
40.0000 mg | Freq: Every day | INTRAVENOUS | Status: DC
  Administered 2012-11-10 (×2): 40 mg via INTRAVENOUS
  Filled 2012-11-10 (×2): qty 40

## 2012-11-10 MED ORDER — INFLUENZA VIRUS VACC SPLIT PF IM SUSP
0.5000 mL | INTRAMUSCULAR | Status: AC
  Administered 2012-11-11: 0.5 mL via INTRAMUSCULAR
  Filled 2012-11-10: qty 0.5

## 2012-11-10 MED ORDER — PNEUMOCOCCAL VAC POLYVALENT 25 MCG/0.5ML IJ INJ
0.5000 mL | INJECTION | INTRAMUSCULAR | Status: AC
  Administered 2012-11-11: 0.5 mL via INTRAMUSCULAR
  Filled 2012-11-10: qty 0.5

## 2012-11-10 NOTE — Progress Notes (Signed)
Patty called from Poison Control to check on patient status

## 2012-11-10 NOTE — Progress Notes (Signed)
     Subjective: This lady was admitted yesterday with polydrug overdose, mainly Seroquel and Prozac. She has become largely unresponsive with prolonged QTC intervals and sinus tachycardia. Fortunately, her this morning her heart rate is in the 80s and QTC is not as prolonged .She remains largely unresponsive.           Physical Exam: Blood pressure 127/77, pulse 80, temperature 97.6 F (36.4 C), temperature source Axillary, resp. rate 12, height 5\' 3"  (1.6 m), weight 50.3 kg (110 lb 14.3 oz), SpO2 95.00%. She is responsive to painful stimuli and did open the eyes for me. She moves all her limbs. She has hyperreflexia in her legs. Both plantars are upgoing. Heart sounds are present and normal. Lung fields are clear. Abdomen soft without any masses. There is no obvious tenderness.   Investigations:  Recent Results (from the past 240 hour(s))  MRSA PCR SCREENING     Status: Normal   Collection Time   11/10/12  2:41 AM      Component Value Range Status Comment   MRSA by PCR NEGATIVE  NEGATIVE Final      Basic Metabolic Panel:  Basename 11/10/12 0450 11/09/12 2243 11/09/12 2051  NA 138 -- 137  K 4.2 -- 3.4*  CL 109 -- 98  CO2 22 -- 22  GLUCOSE 131* -- 276*  BUN 15 -- 24*  CREATININE 0.49* -- 0.72  CALCIUM 7.9* -- 9.4  MG 2.3 1.8 --  PHOS -- -- --   Liver Function Tests:  [facility] 11/10/12 0450  AST 10  ALT <5  ALKPHOS 47  BILITOT 0.2*  PROT 5.6*  ALBUMIN 2.8*     CBC:  Basename 11/10/12 0450 11/09/12 2051  WBC 8.9 14.5*  NEUTROABS -- 12.8*  HGB 8.7* 11.2*  HCT 26.4* 34.0*  MCV 86.3 87.0  PLT 336 395        Medications: I have reviewed the patient's current medications.  Impression: 1. Multiple drug overdose, intentional suicide attempt. Seroquel and Prozac. 2. Altered mental status secondary to #1. 3. Type 2 diabetes mellitus. 4. Hypertension. 5. Anemia, probably dilutional. No evidence of bleeding.     Plan: 1. Reduce IV fluids. Repeat  CBC and complete metabolic panel. 2. Continue supportive measures. 3. If her mental status does not improve in the next 24 hours or so, consider MRI brain scan.     LOS: 1 day   Wilson Singer Pager [phone]  11/10/2012, 7:27 AM

## 2012-11-10 NOTE — Progress Notes (Signed)
UR Chart Review Completed  

## 2012-11-10 NOTE — ED Notes (Signed)
Patient is flopping all over bed. Picking at things in the air and moaning out as if in pain. Not able to verbalize when you call her name.

## 2012-11-10 NOTE — Plan of Care (Signed)
Problem: Consults Goal: General Medical Patient Education See Patient Education Module for specific education. Outcome: Progressing HR has lowered, no longer ST Goal: Skin Care Protocol Initiated - if Braden Score 18 or less If consults are not indicated, leave blank or document N/A Outcome: Progressing Will maintain good skin preventive care Goal: Nutrition Consult-if indicated Outcome: Progressing Will monitor labs and patient arousal for reinstatement of dietary needs

## 2012-11-10 NOTE — H&P (Signed)
[facility] History and Physical  Jaclyn [name]  WUJ:[id number]  DOB: 07-May-1956   DOA: 11/09/2012   PCP:   Herb Grays, MD   Chief Complaint:  Acute Confusion  HPI: Jaclyn [name] is an 56 y.o. female.   Caucasian lady with a history of diabetes hypertension and depression maintained on Prozac and quetiapine, and the past history of suicidal attempts, was brought in by family after she was found out in the woods. Patient was hypothermic with a temperature of 91.7 on arrival to the ER, but was alert and oriented and able to give a history of having overdosed on Seroquel and Prozac, exact amount of overdose unknown may be 15 x 20 mg tablets Prozac, and 20x25 mg Seroquel. The overdose occurred at about 4 PM and patient arrived at the emergency room about 7:30 PM.  Patient was resuscitated with warm IV fluids and was evaluated by the act team, then suddenly about 2-1/2 hours after arrival in the emergency room patient suddenly decompensated, became confused, tachycardic into the high 130s, and began thrashing around in this stretcher. Poison control was contacted and they felt this was manifestations of Seroquel toxicity, and recommend medical admission for symptomatic management.  Because of the patient's delirious state no further personal history can be obtained.  Rewiew of Systems:  Unable to obtain.   Past Medical History  Diagnosis Date  . Diabetes mellitus without complication   . Hypertension   . Arthritis   . Depression     Past Surgical History  Procedure Date  . Tubal ligation   . Nasal septum surgery     Medications:  HOME MEDS: Prior to Admission medications   Medication Sig Start Date End Date Taking? Authorizing Provider  enalapril (VASOTEC) 10 MG tablet Take 10 mg by mouth daily.   Yes Historical Provider, MD  FLUoxetine (PROZAC) 20 MG capsule Take 20 mg by mouth daily.   Yes Historical Provider, MD  meloxicam (MOBIC) 15 MG tablet Take 15 mg by mouth daily.  dosage not on bottle   Yes Historical Provider, MD  metFORMIN (GLUCOPHAGE) 1000 MG tablet Take 1,000 mg by mouth 2 (two) times daily with a meal.   Yes Historical Provider, MD  QUEtiapine (SEROQUEL) 25 MG tablet Take 25 mg by mouth at bedtime.   Yes Historical Provider, MD  venlafaxine XR (EFFEXOR-XR) 75 MG 24 hr capsule Take 75 mg by mouth daily.   Yes Historical Provider, MD     Allergies:  Allergies  Allergen Reactions  . Codeine Nausea And Vomiting    States that she cannot take in high doses  . Morphine And Related Nausea And Vomiting    States that she cannot take in high doses    Social History:   reports that she has never smoked. She does not have any smokeless tobacco history on file. She reports that she does not drink alcohol or use illicit drugs. Unable to confirm  Family History: No family history on file. Unable to obtain  Physical Exam: Filed Vitals:   11/09/12 2100 11/09/12 2211 11/09/12 2219 11/09/12 2332  BP: 120/61 133/107 151/65 169/83  Pulse: 103 124 139 129  Temp:  97.6 F (36.4 C)    TempSrc:  Oral    Resp: 15 19 22 15   Height:      Weight:      SpO2: 98% 98% 98% 97%   Blood pressure 169/83, pulse 129, temperature 97.6 F (36.4 C), temperature source Oral, resp. rate 15, height  5\' 3"  (1.6 m), weight 45.36 kg (100 lb), SpO2 97.00%.  GEN:  Delirious, small framed, middle-aged Caucasian lady lying in the stretcher; episodic choreoathetoid movements; none cooperative with exam;  PSYCH:  alert and oriented x0; responds purposefully to painful. HEENT: Mucous membranes pink, dry, and anicteric; PERRLA; EOM intact; no cervical lymphadenopathy nor thyromegaly or carotid bruit; no JVD; Breasts:: Not examined CHEST WALL: No tenderness CHEST: Normal respiration, clear to auscultation bilaterally HEART: Tachycardic regular rhythm; no murmurs rubs or gallops BACK: No kyphosis or scoliosis; no CVA tenderness ABDOMEN:  soft non-tender; no masses, no  organomegaly, ns; no pannus; no intertriginous candida. Rectal Exam: Not done EXTREMITIES: ; age-appropriate arthropathy of the hands and knees; no edema; no ulcerations. Genitalia: not examined PULSES: 2+ and symmetric SKIN: Normal hydration no rash or ulceration CNS: Cranial nerves 2-12 grossly intact no focal lateralizing neurologic deficit; moves all limbs   Labs on Admission:  Basic Metabolic Panel:  Lab 11/09/12 1610 11/09/12 2051  NA -- 137  K -- 3.4*  CL -- 98  CO2 -- 22  GLUCOSE -- 276*  BUN -- 24*  CREATININE -- 0.72  CALCIUM -- 9.4  MG 1.8 --  PHOS -- --   Liver Function Tests: No results found for this basename: AST:5,ALT:5,ALKPHOS:5,BILITOT:5,PROT:5,ALBUMIN:5 in the last 168 hours No results found for this basename: LIPASE:5,AMYLASE:5 in the last 168 hours No results found for this basename: AMMONIA:5 in the last 168 hours CBC:  Lab 11/09/12 2051  WBC 14.5*  NEUTROABS 12.8*  HGB 11.2*  HCT 34.0*  MCV 87.0  PLT 395   Cardiac Enzymes: No results found for this basename: CKTOTAL:5,CKMB:5,CKMBINDEX:5,TROPONINI:5 in the last 168 hours BNP: No components found with this basename: POCBNP:5 D-dimer: No components found with this basename: D-DIMER:5 CBG:  Lab 11/09/12 1952  GLUCAP 335*    Radiological Exams on Admission: No results found.  EKG: Independently reviewed. Sinus tachycardia with long QT   Assessment/Plan Present on Admission:  . Drug overdose, multiple drugs Delirium due to drug overdose   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 . Suicidal behavior . Diabetes type 2, uncontrolled  . Abnormal EKG Hypokalemia  . Marland Kitchen Hypertension . Depression   PLAN: We'll admit this lady to the intensive care unit for vigorous hydration and repletion of potassium; Although her magnesium is normal we'll go ahead and give her a bolus of magnesium to protect her heart in the presence of a prolonged QT Will give benzodiazepines for agitation and movement  disorder, and given Lopressor to help with her tachycardia and hypotension.  Will not give anticholinergic drugs for her movement disorder since she is or markedly tachycardic  Sliding scale insulin for diabetes  Other supportive care as necessary intensive care  Other plans as per orders.  Code Status: Full code  Disposition Plan: Reconsult behavioral health 1 patient becomes stable again; however recognize the patient's situation his critical  Critical care time: 60 minutes.  Monzerat Handler Nocturnist [facility] Pager [phone]   11/10/2012, 12:37 AM

## 2012-11-10 NOTE — Clinical Social Work Psychosocial (Signed)
    Clinical Social Work Department BRIEF PSYCHOSOCIAL ASSESSMENT 11/10/2012  Patient:  Jaclyn [name], Jaclyn [name]     Account Number:  [id number]     Admit date:  11/09/2012  Clinical Social Worker:  Santa Genera, CLINICAL SOCIAL WORKER  Date/Time:  11/10/2012 12:00 N  Referred by:  Physician  Date Referred:  11/10/2012 Referred for  Behavioral Health Issues   Other Referral:   Interview type:  Family Other interview type:    PSYCHOSOCIAL DATA Living Status:  FAMILY Admitted from facility:   Level of care:   Primary support name:  [employer] Primary support relationship to patient:  CHILD, ADULT Degree of support available:   Significant  CURRENT CONCERNS Current Concerns  Behavioral Health Issues   Other Concerns:    SOCIAL WORK ASSESSMENT / PLAN CSW attempted to meet w patient, patient groggy and sleepy. Patient gave permission to speak w daughter, Jaclyn [name]. CSW interviewed daughter, who currently lives w patient. Patient laid off in June 2012 from [employer] where she had worked for 7 years.  Collected unemployment until June 2013 when benefits expired.  Patient has not been able to find another job.  Daughter, who lives w patient, is also unemployed.  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  Patient's father died from Huntingtons disease 2011-08-22, today is his birthday.  Daughter feels mother was overwhelmed by financial stress, inability to find job, and continued struggle w grief over loss of father.    Daughter has contacted her paternal step grandmother who lives in [place].  Garndmother is willing for daughter and patient to move in w her so they will not have to pay for utilities or food.  Daughter hopes this will relieve some stress on patient.  Both mother and daughter have been looking for work and have accessed various community supports and resources.      Daughter indicated that mother cannot be left alone. Daughter concerned that mother has been persistently struggling w suicidal ideation due to financial and personal stress.  Mother has been followed by [facility] for medications management since her discharge from [facility] in October 2012.    Encouraged daughter to continue to access community resources and family and church support.    CSW will sign off anticipating that ACT team will be consulted for patient discharge disposition when medically cleared.   Assessment/plan status:  Referral to [facility] Other assessment/ plan:   Information/referral to community resources:   [employer]    PATIENT'S/FAMILY'S RESPONSE TO PLAN OF CARE: Adult nurse.   Santa Genera, LCSW Clinical Social Worker [phone])

## 2012-11-10 NOTE — ED Notes (Signed)
Patient seems to be settled down and is asleep and snoring at this time.

## 2012-11-11 ENCOUNTER — Encounter (HOSPITAL_COMMUNITY): Payer: Self-pay | Admitting: *Deleted

## 2012-11-11 LAB — COMPREHENSIVE METABOLIC PANEL
ALT: 6 U/L (ref 0–35)
Alkaline Phosphatase: 53 U/L (ref 39–117)
CO2: 20 mEq/L (ref 19–32)
Calcium: 8.4 mg/dL (ref 8.4–10.5)
Chloride: 105 mEq/L (ref 96–112)
GFR calc Af Amer: >90 mL/min (ref >90)
GFR calc non Af Amer: >90 mL/min (ref >90)
Glucose, Bld: 139 mg/dL — ABNORMAL HIGH (ref 70–99)
Sodium: 135 mEq/L (ref 135–145)
Total Bilirubin: 0.2 mg/dL — ABNORMAL LOW (ref 0.3–1.2)

## 2012-11-11 LAB — CBC
Hemoglobin: 9.7 g/dL — ABNORMAL LOW (ref 12.0–15.0)
MCHC: 33 g/dL (ref 30.0–36.0)
Platelets: 378 10*3/uL (ref 150–400)
RDW: 13.4 % (ref 11.5–15.5)

## 2012-11-11 LAB — GLUCOSE, CAPILLARY: Glucose-Capillary: 160 mg/dL — ABNORMAL HIGH (ref 70–99)

## 2012-11-11 MED ORDER — PANTOPRAZOLE SODIUM 40 MG PO TBEC
40.0000 mg | DELAYED_RELEASE_TABLET | Freq: Every day | ORAL | Status: DC
  Administered 2012-11-11 – 2012-11-12 (×2): 40 mg via ORAL
  Filled 2012-11-11 (×2): qty 1

## 2012-11-11 MED ORDER — ENALAPRIL MALEATE 5 MG PO TABS
10.0000 mg | ORAL_TABLET | Freq: Every day | ORAL | Status: DC
  Administered 2012-11-11 – 2012-11-12 (×2): 10 mg via ORAL
  Filled 2012-11-11 (×2): qty 2

## 2012-11-11 NOTE — Progress Notes (Signed)
Foley Catheter removed during bathtime this am as patient is ambulatory and able to perform ADL's without assist

## 2012-11-11 NOTE — Progress Notes (Signed)
     Subjective: This lady is much improved today, she is alert and orientated having breakfast. She is back to her usual self.          Physical Exam: Blood pressure 166/76, pulse 90, temperature 98.7 F (37.1 C), temperature source Oral, resp. rate 17, height 5\' 3"  (1.6 m), weight 51.1 kg (112 lb 10.5 oz), SpO2 98.00%. Alert and orientated. Looks depressed. Heart sounds are present and normal. Lung fields are clear. No focal neurological signs.   Investigations:  Recent Results (from the past 240 hour(s))  MRSA PCR SCREENING     Status: Normal   Collection Time   11/10/12  2:41 AM      Component Value Range Status Comment   MRSA by PCR NEGATIVE  NEGATIVE Final      Basic Metabolic Panel:  Basename 11/11/12 0436 11/10/12 1231 11/10/12 0450 11/09/12 2243  NA 135 140 -- --  K 3.7 3.7 -- --  CL 105 108 -- --  CO2 20 22 -- --  GLUCOSE 139* 88 -- --  BUN 5* 10 -- --  CREATININE 0.52 0.55 -- --  CALCIUM 8.4 8.4 -- --  MG -- -- 2.3 1.8  PHOS -- -- -- --   Liver Function Tests:  [facility] 11/11/12 0436 11/10/12 1231  AST 11 11  ALT 6 6  ALKPHOS 53 52  BILITOT 0.2* 0.2*  PROT 5.9* 5.8*  ALBUMIN 3.0* 3.0*     CBC:  Basename 11/11/12 0436 11/10/12 1231 11/09/12 2051  WBC 5.9 7.6 --  NEUTROABS -- -- 12.8*  HGB 9.7* 9.7* --  HCT 29.4* 29.9* --  MCV 86.2 86.9 --  PLT 378 357 --        Medications: I have reviewed the patient's current medications.  Impression: 1. Multiple drug overdose, intentional suicide attempt. Seroquel and Prozac. 2. Altered mental status secondary to #1. Improved now. 3. Type 2 diabetes mellitus. 4. Hypertension.      Plan: 1. Patient is now medically stable. Act team to evaluate. Disposition will depend on their recommendations.     LOS: 2 days   Wilson Singer Pager [phone]  11/11/2012, 7:29 AM

## 2012-11-11 NOTE — Progress Notes (Signed)
The patient is receiving Protonix by the intravenous route.  Based on criteria approved by the Pharmacy and Therapeutics Committee and the Medical Executive Committee, the medication is being converted to the equivalent oral dose form.  These criteria include: -No Active GI bleeding -Able to tolerate diet of full liquids (or better) or tube feeding OR able to tolerate other medications by the oral or enteral route  If you have any questions about this conversion, please contact the Pharmacy Department (ext [phone]).  Thank you.  Jaclyn [name], [facility] 11/11/2012 10:01 AM

## 2012-11-11 NOTE — Progress Notes (Signed)
TELEPSYC NOTIFIED AND FAXED CONSULT D/T ILLNESS OF ACT TEAM MEMBER. AWAITING CONTACT FROM [facility].

## 2012-11-12 DIAGNOSIS — I10 Essential (primary) hypertension: Secondary | ICD-10-CM

## 2012-11-12 DIAGNOSIS — E119 Type 2 diabetes mellitus without complications: Secondary | ICD-10-CM

## 2012-11-12 DIAGNOSIS — F489 Nonpsychotic mental disorder, unspecified: Secondary | ICD-10-CM

## 2012-11-12 LAB — GLUCOSE, CAPILLARY
Glucose-Capillary: 139 mg/dL — ABNORMAL HIGH (ref 70–99)
Glucose-Capillary: 150 mg/dL — ABNORMAL HIGH (ref 70–99)
Glucose-Capillary: 154 mg/dL — ABNORMAL HIGH (ref 70–99)

## 2012-11-12 MED ORDER — METFORMIN HCL 500 MG PO TABS
1000.0000 mg | ORAL_TABLET | Freq: Two times a day (BID) | ORAL | Status: DC
  Filled 2012-11-12: qty 2

## 2012-11-12 MED ORDER — BUPROPION HCL 75 MG PO TABS
75.0000 mg | ORAL_TABLET | Freq: Every day | ORAL | Status: DC
  Administered 2012-11-12: 75 mg via ORAL
  Filled 2012-11-12 (×3): qty 1

## 2012-11-12 MED ORDER — MELOXICAM 7.5 MG PO TABS
15.0000 mg | ORAL_TABLET | Freq: Every day | ORAL | Status: DC
  Administered 2012-11-12: 15 mg via ORAL
  Filled 2012-11-12 (×3): qty 1

## 2012-11-12 MED ORDER — LORAZEPAM 1 MG PO TABS
1.0000 mg | ORAL_TABLET | Freq: Four times a day (QID) | ORAL | Status: DC | PRN
  Administered 2012-11-12: 1 mg via ORAL
  Filled 2012-11-12: qty 1

## 2012-11-12 MED ORDER — DOCUSATE SODIUM 100 MG PO CAPS
100.0000 mg | ORAL_CAPSULE | Freq: Two times a day (BID) | ORAL | Status: DC
  Administered 2012-11-12: 100 mg via ORAL
  Filled 2012-11-12: qty 1

## 2012-11-12 NOTE — Discharge Summary (Signed)
Physician Discharge Summary  Patient ID: Jaclyn [name] MRN: [id number] DOB/AGE: 54-Mar-1957 54 y.o.  Admit date: 11/09/2012 Discharge date: 11/12/2012  Discharge Diagnoses:  Principal Problem:  *Drug overdose, multiple drugs Active Problems:  Suicidal behavior  Depression  DM type 2 (diabetes mellitus, type 2)  Hypertension  Abnormal EKG  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  Delirium, drug-induced     Medication List     As of 11/12/2012  1:52 PM    ASK your doctor about these medications         enalapril 10 MG tablet   Commonly known as: VASOTEC   Take 10 mg by mouth daily.      FLUoxetine 20 MG capsule   Commonly known as: PROZAC   Take 20 mg by mouth daily.      meloxicam 15 MG tablet   Commonly known as: MOBIC   Take 15 mg by mouth daily. dosage not on bottle      metFORMIN 1000 MG tablet   Commonly known as: GLUCOPHAGE   Take 1,000 mg by mouth 2 (two) times daily with a meal.      QUEtiapine 25 MG tablet   Commonly known as: SEROQUEL   Take 25 mg by mouth at bedtime.      venlafaxine XR 75 MG 24 hr capsule   Commonly known as: EFFEXOR-XR   Take 75 mg by mouth daily.         Disposition: inpatient psychiatric facility  Discharged Condition:  Medically stable  Consults:  telepsychiatry.  ACT team  Labs:    Sodium     138 135     Potassium     4.2 3.7     Chloride     109 105     CO2     22 20     Mean Plasma Glucose     171      BUN     15 5     Creatinine, Ser     0.49 0.52     Calcium     7.9 8.4     GFR calc non Af Amer     >90 >90     GFR calc Af Amer     >90 >90     Glucose, Bld     131 139     Magnesium     2.3      Alkaline Phosphatase     47 53     Albumin     2.8 3.0     AST     10 11     ALT     <5 6     Total Protein     5.6 5.9     Total Bilirubin     0.2 0.2     CARDIAC PROFILE   Total CK     141      CBC   WBC     8.9 5.9     RBC     3.06 3.41     Hemoglobin     8.7 9.7     HCT     26.4 29.4     MCV     86.3 86.2     MCH      28.4 28.4     MCHC     33.0 33.0     RDW     13.2 13.4     Platelets     336 378  DIFFERENTIAL   Neutrophils Relative     88      Lymphocytes Relative     7      Monocytes Relative     5      Eosinophils Relative     0      Basophils Relative     0      Neutro Abs     12.8      Lymphs Abs     1.1      Monocytes Absolute     0.7      Eosinophils Absolute     0.0      Basophils Absolute     0.0      PROTIME W/ INR   Prothrombin Time     15.6      INR     1.27      PTT   aPTT     27      OTHER DRUGS   Salicylate Lvl     <2.0      Acetaminophen (Tylenol), Serum     <15.0      DIABETES   Hemoglobin A1C     7.6      Glucose, Bld     131 139     URINALYSIS   Color, Urine     YELLOW      APPearance     CLEAR      Specific Gravity, Urine     >1.030      pH     5.5      Glucose, UA     250      Bilirubin Urine     SMALL      Ketones, ur     40      Protein, ur     NEGATIVE      Urobilinogen, UA     0.2      Nitrite     NEGATIVE      Leukocytes, UA     NEGATIVE      Hgb urine dipstick     NEGATIVE      TOX, BLOOD   Alcohol, Ethyl (B)     <11      TOX, URINE   Amphetamines     NONE DETECTED      Barbiturates     NONE DETECTED      Benzodiazepines     NONE DETECTED      Opiates     NONE DETECTED      Cocaine     NONE DETECTED      Tetrahydrocannabinol     NONE DETECTED   Procedures:  none  EKG: sinus tachycardia with prolonged QT interval  Full Code   Hospital Course: See H&P for complete admission details. The patient is a 56 year old white female with history of depression and previous suicide attempt. Family brought her to the emergency room. She took an overdose of Prozac and Seroquel. She was found in the woods. She was hypothermic with a temperature of 91.7 on arrival to the emergency room. Initially in the emergency room, she was alert and oriented. However after resuscitation, she became confused with dystonia and tachycardia. Poison control was contacted.  Medical admission was recommended. Patient took probably 15 tablets of 20 mg of Prozac. Probably about 20 tablets of Seroquel 25 mg. She was admitted to step down and monitored on telemetry. Her potassium initially was low and this was repleted. Her Seroquel and  Prozac were stopped. She was given benzodiazepines as needed. Her temperature normalized and her delirium cleared. She was evaluated remotely by psychiatry who recommended starting well. Trend. She was started on 75 mg today. They also recommend continued psychiatric care in an inpatient setting. She has been stabilized from a medical perspective. Her QT interval has normalized. Her tachycardia has resolved. Total time on the day of discharge greater than 30 minutes.  Discharge Exam: See progress note  Signed: Nethra [name] L 11/12/2012, 1:52 PM

## 2012-11-12 NOTE — Progress Notes (Addendum)
PT ALERT AND ORIENTED. BLUNT AFFECT. VSS. LT ARM NSL PATENT AND INTACT. PT HAS BEEN SEEN BY ACT TEAM MEMBER FELICA. WHO IS TRYING TO ARRANGE BEHAVIOR HEALTH  FACILITY TRANSFER SINCE PT IS MEDICALLY CLEAR NOW. PT BEING TRANSFERRED TO ROOM 322. TRANSFER REPORT GIVEN TO NADINE RN ON 300. NO SELF HARM ACTIVITY OBSEVERED IN THE PAST 48 HRS. PT HAS FAMILY SUPPORT AT THIS TIME.

## 2012-11-12 NOTE — BH Assessment (Addendum)
Assessment Note   Jaclyn [name] is an 56 y.o. female. Patient is alert and oriented; pleasant and cooperative. She has had multiple problems which lead her to attempting to take her life. She reports that she lost her job. She was able to draw unemployment until that ran out. 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 They report that their food stamps were cut off due to not qualifying under the changed federal rules. So, they were only eating one meal a day in an effort to conserve their resources. They have no income at this time. They are not sure how they are going to be able to live. She is tearful as she explains how they have cut back on everything and really don't have much except a place to live. Daughter is exploring the possibility of living with relatives when patient is d/c from inpatient program to help until they get back on their feet. Patient reports she goes to [facility], but they only see a PA-C and they just give her medications. She reports she really needs to talk to a therapist and deal with some of the frustrations and issues, but that isn't offered. She states that she is having frequent panic attacks, that sometimes, when she walks into [facility], she can barely get through the door because she starts having a panic attack. She continues to feel hopeless and helpless; overwhelmed with limited local resources to help her through this difficult time. She also continues to grieve from the loss of her father, he died one year ago.   Patient was seen by telepsychiatry on 11/11/12 and it was recommended that she be referred to an inpatient program. Patient will be referred back to [facility] and [facility].  Patient will also be given information on [facility] and [facility] for possible providers so she may have access to a therapist.  Also recommended they return to [facility]. DSS  and re-apply for emergency food stamps since their situation has changed since July and also ask to speak to the Navigator for possible information on access health insurance. Patient was agreeable to the plan.   Axis I: Major Depression, Recurrent severe; Panic Disorder without a Agoraphobia Axis II: Deferred Axis III: Serious drug overdose; hx of DM, HTN and Arthritis Axis IV: Significant financial and social stressors Axis V: GAF 18; Locus is 34  Past Medical History:  Past Medical History  Diagnosis Date  . Diabetes mellitus without complication   . Hypertension   . Arthritis   . Depression     Past Surgical History  Procedure Date  . Tubal ligation   . Nasal septum surgery     Family History: History reviewed. No pertinent family history.  Social History:  reports that she has never smoked. She does not have any smokeless tobacco history on file. She reports that she does not drink alcohol or use illicit drugs.  Additional Social History:  Alcohol / Drug Use Pain Medications: Daughter denies any drug or alcohol use by pt.  UDS/BAC both negative. History of alcohol / drug use?: No history of alcohol / drug abuse  CIWA: CIWA-Ar BP: 160/78 mmHg Pulse Rate: 99  COWS:    Allergies:  Allergies  Allergen Reactions  . Codeine Nausea And Vomiting    States that she cannot take in high doses  . Morphine And Related  Nausea And Vomiting    States that she cannot take in high doses    Home Medications:  Medications Prior to Admission  Medication Sig Dispense Refill  . enalapril (VASOTEC) 10 MG tablet Take 10 mg by mouth daily.      Marland Kitchen FLUoxetine (PROZAC) 20 MG capsule Take 20 mg by mouth daily.      . meloxicam (MOBIC) 15 MG tablet Take 15 mg by mouth daily. dosage not on bottle      . metFORMIN (GLUCOPHAGE) 1000 MG tablet Take 1,000 mg by mouth 2 (two) times daily with a meal.      . QUEtiapine (SEROQUEL) 25 MG tablet Take 25 mg by mouth at bedtime.      Marland Kitchen venlafaxine XR  (EFFEXOR-XR) 75 MG 24 hr capsule Take 75 mg by mouth daily.        OB/GYN Status:  No LMP recorded. Patient is postmenopausal.  General Assessment Data Location of Assessment: [facility] ACT Assessment: Yes (ICU Bed 4) Living Arrangements: Children Can pt return to current living arrangement?: Yes Admission Status: Involuntary Is patient capable of signing voluntary admission?: Yes Transfer from: Acute Hospital Referral Source: Medical Floor Inpatient     Risk to self Suicidal Ideation: Yes-Currently Present Suicidal Intent: Yes-Currently Present Is patient at risk for suicide?: Yes Suicidal Plan?: Yes-Currently Present Specify Current Suicidal Plan: patient attempted overdose Access to Means: Yes Specify Access to Suicidal Means: took overdose of seroquel and prozac What has been your use of drugs/alcohol within the last 12 months?: denies Previous Attempts/Gestures: No Intentional Self Injurious Behavior: None Family Suicide History: Unknown Recent stressful life event(s): Financial Problems Persecutory voices/beliefs?: No Depression: Yes Depression Symptoms: Isolating;Loss of interest in usual pleasures;Feeling worthless/self pity Substance abuse history and/or treatment for substance abuse?: No Suicide prevention information given to non-admitted patients: Not applicable  Risk to Others Homicidal Ideation: No Thoughts of Harm to Others: No Current Homicidal Intent: No Current Homicidal Plan: No Access to Homicidal Means: No History of harm to others?: No Assessment of Violence: None Noted Does patient have access to weapons?: No Criminal Charges Pending?: No Does patient have a court date: No  Psychosis Hallucinations: None noted Delusions: None noted  Mental Status Report Appear/Hygiene: Disheveled Eye Contact: Poor Motor Activity: Freedom of movement;Restlessness Speech: Soft Level of Consciousness: Restless Mood: Anxious Affect:  Anxious;Depressed;Preoccupied Anxiety Level: Panic Attacks Panic attack frequency:  (2 x per day) Most recent panic attack:  (5 days ago) Thought Processes: Coherent Judgement: Unimpaired Orientation: Person;Place;Time Obsessive Compulsive Thoughts/Behaviors: None  Cognitive Functioning Concentration: Decreased Memory: Recent Impaired;Remote Impaired IQ: Average Insight: Fair Impulse Control: Poor Appetite: Poor Weight Loss:  (10) Weight Gain: 0  Sleep: No Change Total Hours of Sleep:  (4) Vegetative Symptoms: None  ADLScreening [facility] Assessment Services) Patient's cognitive ability adequate to safely complete daily activities?: Yes Patient able to express need for assistance with ADLs?: Yes Independently performs ADLs?: Yes (appropriate for developmental age)  Abuse/Neglect [facility]) Physical Abuse: Denies Verbal Abuse: Denies Sexual Abuse: Denies  Prior Inpatient Therapy Prior Inpatient Therapy: Yes Prior Therapy Dates:  (09/2011) Prior Therapy Facilty/Provider(s):  ([facility]) Reason for Treatment:  (depression, si)  Prior Outpatient Therapy Prior Outpatient Therapy: Yes Prior Therapy Dates: current Prior Therapy Facilty/Provider(s): [facility] Reason for Treatment: meds only  ADL Screening (condition at time of admission) Patient's cognitive ability adequate to safely complete daily activities?: Yes Patient able to express need for assistance with ADLs?: Yes Independently performs ADLs?: Yes (appropriate for developmental age)  Communication:  (moans out only) Is this a change from baseline?: Change from baseline, expected to last <3 days Dressing (OT): Dependent Is this a change from baseline?: Change from baseline, expected to last <3days Grooming: Dependent Is this a change from baseline?: Change from baseline, expected to last <3 days Feeding: Dependent Is this a change from baseline?: Change from baseline, expected to last <3 days Bathing: Dependent Is this a  change from baseline?: Change from baseline, expected to last <3 days Toileting: Needs assistance Is this a change from baseline?: Change from baseline, expected to last <3 days In/Out Bed: Dependent Is this a change from baseline?: Change from baseline, expected to last <3 days Walks in Home: Independent Weakness of Legs: Both Weakness of Arms/Hands: Both  Home Assistive Devices/Equipment Home Assistive Devices/Equipment: None  Therapy Consults (therapy consults require a physician order) PT Evaluation Needed: No OT Evalulation Needed: No SLP Evaluation Needed: No Abuse/Neglect Assessment (Assessment to be complete while patient is alone) Physical Abuse: Denies Verbal Abuse: Denies Sexual Abuse: Denies Exploitation of patient/patient's resources: Denies Self-Neglect: Denies Values / Beliefs Cultural Requests During Hospitalization: None Spiritual Requests During Hospitalization: None Consults Spiritual Care Consult Needed: Yes (Comment) Social Work Consult Needed: Yes (Comment) Advance Directives (For Healthcare) Advance Directive: Patient does not have advance directive Pre-existing out of facility DNR order (yellow form or pink MOST form): No Nutrition Screen- [facility] Adult/[facility] Patient's home diet: Regular Have you recently lost weight without trying?: No Have you been eating poorly because of a decreased appetite?: No Malnutrition Screening Tool Score: 0   Additional Information 1:1 In Past 12 Months?: No CIRT Risk: No Elopement Risk: No Does patient have medical clearance?: Yes     Disposition:  Disposition Disposition of Patient: Inpatient treatment program Type of inpatient treatment program: Adult  On Site Evaluation by:  Dr. Lendell Caprice Reviewed with Physician:  Dr. Vivi Martens, [facility] H 11/12/2012 11:54 AM  11/12/2012 1:25 PM  Patient accepted by Dr. Mercy Riding, at [facility]. Authorization received by Sunny Schlein at [facility]; for three days,  authorization [id number].  Shon Baton, MSW, LCSW, LCASA, CSW-G

## 2012-11-12 NOTE — Progress Notes (Signed)
Chart reviewed. Including tele-psychiatry evaluation from 11/11/2012  Subjective: C/o knee pain. Feels anxious  Objective: Vital signs in last 24 hours: Filed Vitals:   11/12/12 0400 11/12/12 0500 11/12/12 0600 11/12/12 0700  BP: 163/97 147/70 131/90 157/83  Pulse: 83 84 85 91  Temp: 98.4 F (36.9 C)   99.8 F (37.7 C)  TempSrc: Oral     Resp: 23 13 16 22   Height:      Weight:  49.2 kg (108 lb 7.5 oz)    SpO2: 96% 96% 94% 95%   Weight change: -1.9 kg (-4 lb 3 oz)  Intake/Output Summary (Last 24 hours) at 11/12/12 0825 Last data filed at 11/12/12 0500  Gross per 24 hour  Intake   1083 ml  Output      0 ml  Net   1083 ml   Gen.: Alert. Cooperative. Answers questions. Looks to her daughter frequently prior to answering questions. Lungs clear to auscultation bilaterally without wheeze rhonchi or rales Cardiovascular regular rate rhythm without murmurs gallops rubs Abdomen soft nontender nondistended Extremities no clubbing cyanosis or edema. No deformities. Full range of motion. No tenderness about the knees. Psychiatric: Affect is sad. Voice quiet.  Lab Results: Basic Metabolic Panel:  Lab 11/11/12 7829 11/10/12 1231 11/10/12 0450 11/09/12 2243  NA 135 140 -- --  K 3.7 3.7 -- --  CL 105 108 -- --  CO2 20 22 -- --  GLUCOSE 139* 88 -- --  BUN 5* 10 -- --  CREATININE 0.52 0.55 -- --  CALCIUM 8.4 8.4 -- --  MG -- -- 2.3 1.8  PHOS -- -- -- --   Liver Function Tests:  Lab 11/11/12 0436 11/10/12 1231  AST 11 11  ALT 6 6  ALKPHOS 53 52  BILITOT 0.2* 0.2*  PROT 5.9* 5.8*  ALBUMIN 3.0* 3.0*   No results found for this basename: LIPASE:2,AMYLASE:2 in the last 168 hours No results found for this basename: AMMONIA:2 in the last 168 hours CBC:  Lab 11/11/12 0436 11/10/12 1231 11/09/12 2051  WBC 5.9 7.6 --  NEUTROABS -- -- 12.8*  HGB 9.7* 9.7* --  HCT 29.4* 29.9* --  MCV 86.2 86.9 --  PLT 378 357 --   Cardiac Enzymes:  Lab 11/10/12 0450  CKTOTAL 141  CKMB --   CKMBINDEX --  TROPONINI --   BNP: No results found for this basename: PROBNP:3 in the last 168 hours D-Dimer: No results found for this basename: DDIMER:2 in the last 168 hours CBG:  Lab 11/12/12 0439 11/12/12 0003 11/11/12 1959 11/11/12 1615 11/11/12 1151 11/11/12 0722  GLUCAP 150* 154* 139* 185* 91 151*   Hemoglobin A1C:  Lab 11/10/12 0238  HGBA1C 7.6*   Fasting Lipid Panel: No results found for this basename: CHOL,HDL,LDLCALC,TRIG,CHOLHDL,LDLDIRECT in the last 562 hours Thyroid Function Tests: No results found for this basename: TSH,T4TOTAL,FREET4,T3FREE,THYROIDAB in the last 168 hours Coagulation:  Lab 11/10/12 0450  LABPROT 15.6*  INR 1.27   Anemia Panel: No results found for this basename: VITAMINB12,FOLATE,FERRITIN,TIBC,IRON,RETICCTPCT in the last 168 hours Urine Drug Screen: Drugs of Abuse     Component Value Date/Time   LABOPIA NONE DETECTED 11/09/2012 2051   COCAINSCRNUR NONE DETECTED 11/09/2012 2051   LABBENZ NONE DETECTED 11/09/2012 2051   AMPHETMU NONE DETECTED 11/09/2012 2051   THCU NONE DETECTED 11/09/2012 2051   LABBARB NONE DETECTED 11/09/2012 2051    Alcohol Level:  Lab 11/09/12 2051  ETH <11   Urinalysis:  Lab 11/09/12 2051  COLORURINE YELLOW  LABSPEC >1.030*  PHURINE 5.5  GLUCOSEU 250*  HGBUR NEGATIVE  BILIRUBINUR SMALL*  KETONESUR 40*  PROTEINUR NEGATIVE  UROBILINOGEN 0.2  NITRITE NEGATIVE  LEUKOCYTESUR NEGATIVE    Micro Results: Recent Results (from the past 240 hour(s))  MRSA PCR SCREENING     Status: Normal   Collection Time   11/10/12  2:41 AM      Component Value Range Status Comment   MRSA by PCR NEGATIVE  NEGATIVE Final    Scheduled Meds:   . antiseptic oral rinse  15 mL Mouth Rinse q12n4p  . chlorhexidine  15 mL Mouth Rinse BID  . enalapril  10 mg Oral Daily  . enoxaparin (LOVENOX) injection  40 mg Subcutaneous Q24H  . [COMPLETED] influenza  inactive virus vaccine  0.5 mL Intramuscular Tomorrow-1000  . insulin  aspart  0-9 Units Subcutaneous Q4H  . pantoprazole  40 mg Oral Daily  . [COMPLETED] pneumococcal 23 valent vaccine  0.5 mL Intramuscular Tomorrow-1000  . sodium chloride  3 mL Intravenous Q12H  . [DISCONTINUED] pantoprazole (PROTONIX) IV  40 mg Intravenous QHS   Continuous Infusions:  PRN Meds:.acetaminophen, acetaminophen, bisacodyl, LORazepam, metoprolol, ondansetron (ZOFRAN) IV, ondansetron Assessment/Plan: Principal Problem:  *Drug overdose, multiple drugs Active Problems:  Suicidal behavior  Depression  Diabetes type 2, uncontrolled  Hypertension  Abnormal EKG  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  Delirium, drug-induced  Discontinue telemetry. According to psychiatry note, still with suicidal ideation. Await transfer to psych unit. Patient is medically stable. Continue one-to-one suicide precautions. May increase activity. Start Wellbutrin 75 mg every morning for now per psychiatry recommendations. Transfer to MedSurg. Resume metformin. Resume Mobic. Continue Ativan as needed.   LOS: 3 days   Jaclyn [name] L 11/12/2012, 8:25 AM

## 2012-11-12 NOTE — Progress Notes (Signed)
Report called to Lea,RN at [facility]. Patient transported by [facility]. Patient in stable condition at time of transfer.

## 2012-12-23 ENCOUNTER — Emergency Department (HOSPITAL_COMMUNITY)
Admission: EM | Admit: 2012-12-23 | Discharge: 2012-12-23 | Disposition: A | Discharge status: Home / Self Care | Payer: MEDICAID | Attending: Emergency Medicine | Admitting: Emergency Medicine

## 2012-12-23 ENCOUNTER — Encounter (HOSPITAL_COMMUNITY): Payer: Self-pay | Admitting: *Deleted

## 2012-12-23 DIAGNOSIS — Z79899 Other long term (current) drug therapy: Secondary | ICD-10-CM | POA: Insufficient documentation

## 2012-12-23 DIAGNOSIS — I10 Essential (primary) hypertension: Secondary | ICD-10-CM | POA: Insufficient documentation

## 2012-12-23 DIAGNOSIS — F329 Major depressive disorder, single episode, unspecified: Secondary | ICD-10-CM | POA: Insufficient documentation

## 2012-12-23 DIAGNOSIS — R45851 Suicidal ideations: Secondary | ICD-10-CM | POA: Insufficient documentation

## 2012-12-23 DIAGNOSIS — E119 Type 2 diabetes mellitus without complications: Secondary | ICD-10-CM | POA: Insufficient documentation

## 2012-12-23 DIAGNOSIS — F3289 Other specified depressive episodes: Secondary | ICD-10-CM | POA: Insufficient documentation

## 2012-12-23 LAB — RAPID URINE DRUG SCREEN, HOSP PERFORMED
Amphetamines: NOT DETECTED
Opiates: NOT DETECTED

## 2012-12-23 LAB — URINE MICROSCOPIC-ADD ON: Urine-Other: NONE SEEN

## 2012-12-23 LAB — CBC WITH DIFFERENTIAL/PLATELET
Eosinophils Absolute: 0.1 10*3/uL (ref 0.0–0.7)
Eosinophils Relative: 1 % (ref 0–5)
HCT: 36.6 % (ref 36.0–46.0)
Hemoglobin: 12.1 g/dL (ref 12.0–15.0)
Lymphs Abs: 2 10*3/uL (ref 0.7–4.0)
MCH: 28.5 pg (ref 26.0–34.0)
MCV: 86.3 fL (ref 78.0–100.0)
Monocytes Absolute: 0.3 10*3/uL (ref 0.1–1.0)
Monocytes Relative: 6 % (ref 3–12)
Neutrophils Relative %: 60 % (ref 43–77)
RBC: 4.24 MIL/uL (ref 3.87–5.11)

## 2012-12-23 LAB — COMPREHENSIVE METABOLIC PANEL
Alkaline Phosphatase: 53 U/L (ref 39–117)
BUN: 17 mg/dL (ref 6–23)
Creatinine, Ser: 0.69 mg/dL (ref 0.50–1.10)
GFR calc Af Amer: >90 mL/min (ref >90)
Glucose, Bld: 222 mg/dL — ABNORMAL HIGH (ref 70–99)
Potassium: 3.6 mEq/L (ref 3.5–5.1)
Total Bilirubin: 0.2 mg/dL — ABNORMAL LOW (ref 0.3–1.2)
Total Protein: 7.4 g/dL (ref 6.0–8.3)

## 2012-12-23 LAB — URINALYSIS, ROUTINE W REFLEX MICROSCOPIC
Glucose, UA: >1000 mg/dL — AB
Hgb urine dipstick: NEGATIVE
Specific Gravity, Urine: 1.025 (ref 1.005–1.030)

## 2012-12-23 LAB — ETHANOL: Alcohol, Ethyl (B): <11 mg/dL (ref 0–11)

## 2012-12-23 MED ORDER — CITALOPRAM HYDROBROMIDE 40 MG PO TABS: 40.0000 mg | ORAL_TABLET | Freq: Every day | ORAL | Status: DC

## 2012-12-23 MED ORDER — LORAZEPAM 1 MG PO TABS
1.0000 mg | ORAL_TABLET | Freq: Once | ORAL | Status: AC
  Administered 2012-12-23: 1 mg via ORAL
  Filled 2012-12-23: qty 1

## 2012-12-23 MED ORDER — BUSPIRONE HCL 5 MG PO TABS: 10.0000 mg | ORAL_TABLET | Freq: Three times a day (TID) | ORAL | Status: DC

## 2012-12-23 MED ORDER — HYDROXYZINE HCL 25 MG PO TABS: 50.0000 mg | ORAL_TABLET | Freq: Three times a day (TID) | ORAL | Status: DC | PRN

## 2012-12-23 NOTE — ED Notes (Addendum)
Pt has been off medication since November. PT states she is suicidal. Pt has Rx of Buspar, Celexa, and Vistaril. No plan in place to harm herself at this time. Never had Rx filled from [facility] because she was unable to afford it and can't get into [facility] until Jan 6.

## 2012-12-23 NOTE — ED Provider Notes (Signed)
History     CSN: [id number]  Arrival date & time 12/23/12  1325   First MD Initiated Contact with Patient 12/23/12 1335      Chief Complaint  Patient presents with  . V70.1    (Consider location/radiation/quality/duration/timing/severity/associated sxs/prior treatment) HPI  Past Medical History  Diagnosis Date  . Diabetes mellitus without complication   . Hypertension   . Arthritis   . Depression     Past Surgical History  Procedure Date  . Tubal ligation   . Nasal septum surgery     No family history on file.  History  Substance Use Topics  . Smoking status: Never Smoker   . Smokeless tobacco: Not on file  . Alcohol Use: No    OB History    Grav Para Term Preterm Abortions TAB SAB Ect Mult Living                  Review of Systems  Allergies  Codeine and Morphine and related  Home Medications   Current Outpatient Rx  Name  Route  Sig  Dispense  Refill  . BUSPIRONE HCL 10 MG PO TABS   Oral   Take 10 mg by mouth 3 (three) times daily.         Marland Kitchen CITALOPRAM HYDROBROMIDE 40 MG PO TABS   Oral   Take 40 mg by mouth daily.         . ENALAPRIL MALEATE 10 MG PO TABS   Oral   Take 10 mg by mouth daily.         Marland Kitchen HYDROXYZINE HCL 50 MG PO TABS   Oral   Take 50 mg by mouth 3 (three) times daily as needed. For anxiety         . MELOXICAM 15 MG PO TABS   Oral   Take 15 mg by mouth daily.         Marland Kitchen METFORMIN HCL 500 MG PO TABS   Oral   Take 500 mg by mouth 2 (two) times daily with a meal.         . BUSPIRONE HCL 5 MG PO TABS   Oral   Take 2 tablets (10 mg total) by mouth 3 (three) times daily.   42 tablet   1   . CITALOPRAM HYDROBROMIDE 40 MG PO TABS   Oral   Take 1 tablet (40 mg total) by mouth daily.   14 tablet   1   . HYDROXYZINE HCL 25 MG PO TABS   Oral   Take 2 tablets (50 mg total) by mouth 3 (three) times daily as needed for anxiety.   20 tablet   1     BP 217/110  Pulse 88  Temp 97.9 F (36.6 C) (Oral)  Resp  18  Ht 5\' 5"  (1.651 m)  Wt 107 lb 6 oz (48.705 kg)  BMI 17.87 kg/m2  SpO2 97%  Physical Exam  ED Course  Procedures (including critical care time)  Labs Reviewed  COMPREHENSIVE METABOLIC PANEL - Abnormal; Notable for the following:    Glucose, Bld 222 (*)     Total Bilirubin 0.2 (*)     All other components within normal limits  URINALYSIS, ROUTINE W REFLEX MICROSCOPIC - Abnormal; Notable for the following:    Glucose, UA >1000 (*)     Ketones, ur TRACE (*)     All other components within normal limits  CBC WITH DIFFERENTIAL  URINE RAPID DRUG SCREEN (HOSP PERFORMED)  ETHANOL  URINE MICROSCOPIC-ADD ON   No results found.   1. Depression       MDM  Reassessment by behavioral health. Patient was able to contract for safety.  Rx for BuSpar 10 mg 3 times a day #42/1;  Celexa 40 mg daily #14/1;  Vistaril 50 mg 3 times a day when necessary anxiety #20/1.   Will followup with mental health        Donnetta Hutching, MD 12/23/12 2010

## 2012-12-23 NOTE — ED Provider Notes (Signed)
History     CSN: [id number]  Arrival date & time 12/23/12  1325   First MD Initiated Contact with Patient 12/23/12 1335      Chief Complaint  Patient presents with  . V70.1    (Consider location/radiation/quality/duration/timing/severity/associated sxs/prior treatment) HPI Comments: Patient with history of depression, recent hospitalization at [facility] following a suicide attempt by overdose.  She presents today with her daughter for eval of emotional lability, suicidal ideation.  She says that she was to be on medication upon discharge from [facility] however was unable to fill this due financial restraints.  She denies any substance abuse, including alcohol or drugs.    The history is provided by the patient.    Past Medical History  Diagnosis Date  . Diabetes mellitus without complication   . Hypertension   . Arthritis   . Depression     Past Surgical History  Procedure Date  . Tubal ligation   . Nasal septum surgery     No family history on file.  History  Substance Use Topics  . Smoking status: Never Smoker   . Smokeless tobacco: Not on file  . Alcohol Use: No    OB History    Grav Para Term Preterm Abortions TAB SAB Ect Mult Living                  Review of Systems  All other systems reviewed and are negative.    Allergies  Codeine and Morphine and related  Home Medications  No current outpatient prescriptions on file.  BP 217/110  Pulse 88  Temp 97.9 F (36.6 C) (Oral)  Resp 18  Ht 5\' 5"  (1.651 m)  Wt 107 lb 6 oz (48.705 kg)  BMI 17.87 kg/m2  SpO2 97%  Physical Exam  Nursing note and vitals reviewed. Constitutional: She is oriented to person, place, and time. She appears well-developed and well-nourished. No distress.  HENT:  Head: Normocephalic and atraumatic.  Eyes: EOM are normal. Pupils are equal, round, and reactive to light.  Neck: Normal range of motion. Neck supple.  Cardiovascular: Normal rate and regular rhythm.     Pulmonary/Chest: Effort normal and breath sounds normal.  Abdominal: Soft. Bowel sounds are normal.  Musculoskeletal: Normal range of motion. She exhibits no edema.  Neurological: She is alert and oriented to person, place, and time.  Skin: Skin is warm and dry. She is not diaphoretic.    ED Course  Procedures (including critical care time)   Labs Reviewed  CBC WITH DIFFERENTIAL  COMPREHENSIVE METABOLIC PANEL  URINE RAPID DRUG SCREEN (HOSP PERFORMED)  URINALYSIS, ROUTINE W REFLEX MICROSCOPIC  ETHANOL   No results found.   No diagnosis found.    MDM  The patient presents here for depression with suicidal thoughts, and emotional lability.  She has not been on any meds since she left [facility].  I have consulted the Act team who come to speak with the patient.          Geoffery Lyons, MD 12/23/12 [phone]

## 2013-06-09 ENCOUNTER — Emergency Department (HOSPITAL_COMMUNITY)
Admission: EM | Admit: 2013-06-09 | Discharge: 2013-06-10 | Disposition: A | Discharge status: Home / Self Care | Payer: MEDICAID | Attending: Emergency Medicine | Admitting: Emergency Medicine

## 2013-06-09 ENCOUNTER — Encounter (HOSPITAL_COMMUNITY): Payer: Self-pay | Admitting: Emergency Medicine

## 2013-06-09 DIAGNOSIS — F411 Generalized anxiety disorder: Secondary | ICD-10-CM | POA: Insufficient documentation

## 2013-06-09 DIAGNOSIS — IMO0002 Reserved for concepts with insufficient information to code with codable children: Secondary | ICD-10-CM | POA: Insufficient documentation

## 2013-06-09 DIAGNOSIS — F329 Major depressive disorder, single episode, unspecified: Secondary | ICD-10-CM | POA: Insufficient documentation

## 2013-06-09 DIAGNOSIS — Z79899 Other long term (current) drug therapy: Secondary | ICD-10-CM | POA: Insufficient documentation

## 2013-06-09 DIAGNOSIS — M129 Arthropathy, unspecified: Secondary | ICD-10-CM | POA: Insufficient documentation

## 2013-06-09 DIAGNOSIS — R45851 Suicidal ideations: Secondary | ICD-10-CM | POA: Insufficient documentation

## 2013-06-09 DIAGNOSIS — Z3202 Encounter for pregnancy test, result negative: Secondary | ICD-10-CM | POA: Insufficient documentation

## 2013-06-09 DIAGNOSIS — I10 Essential (primary) hypertension: Secondary | ICD-10-CM | POA: Insufficient documentation

## 2013-06-09 DIAGNOSIS — F3289 Other specified depressive episodes: Secondary | ICD-10-CM | POA: Insufficient documentation

## 2013-06-09 DIAGNOSIS — E1169 Type 2 diabetes mellitus with other specified complication: Secondary | ICD-10-CM | POA: Insufficient documentation

## 2013-06-09 LAB — COMPREHENSIVE METABOLIC PANEL
AST: 12 U/L (ref 0–37)
Albumin: 4.1 g/dL (ref 3.5–5.2)
Alkaline Phosphatase: 69 U/L (ref 39–117)
Chloride: 97 mEq/L (ref 96–112)
Potassium: 3.9 mEq/L (ref 3.5–5.1)
Sodium: 135 mEq/L (ref 135–145)
Total Bilirubin: 0.2 mg/dL — ABNORMAL LOW (ref 0.3–1.2)
Total Protein: 7.3 g/dL (ref 6.0–8.3)

## 2013-06-09 LAB — RAPID URINE DRUG SCREEN, HOSP PERFORMED
Amphetamines: NOT DETECTED
Barbiturates: NOT DETECTED
Benzodiazepines: NOT DETECTED
Cocaine: NOT DETECTED

## 2013-06-09 LAB — POCT PREGNANCY, URINE: Preg Test, Ur: NEGATIVE

## 2013-06-09 LAB — CBC
MCHC: 34.1 g/dL (ref 30.0–36.0)
Platelets: 247 10*3/uL (ref 150–400)
RDW: 12.4 % (ref 11.5–15.5)
WBC: 6.4 10*3/uL (ref 4.0–10.5)

## 2013-06-09 LAB — ACETAMINOPHEN LEVEL: Acetaminophen (Tylenol), Serum: <15 ug/mL (ref 10–30)

## 2013-06-09 MED ORDER — HYDROXYZINE HCL 25 MG PO TABS
100.0000 mg | ORAL_TABLET | Freq: Two times a day (BID) | ORAL | Status: DC
  Administered 2013-06-09: 100 mg via ORAL
  Filled 2013-06-09: qty 4

## 2013-06-09 MED ORDER — ACETAMINOPHEN 325 MG PO TABS: 650.0000 mg | ORAL_TABLET | ORAL | Status: DC | PRN

## 2013-06-09 MED ORDER — NICOTINE 21 MG/24HR TD PT24
21.0000 mg | MEDICATED_PATCH | Freq: Every day | TRANSDERMAL | Status: DC
  Filled 2013-06-09: qty 1

## 2013-06-09 MED ORDER — ENALAPRIL MALEATE 10 MG PO TABS
10.0000 mg | ORAL_TABLET | Freq: Every day | ORAL | Status: DC
  Administered 2013-06-09: 10 mg via ORAL
  Filled 2013-06-09 (×2): qty 1

## 2013-06-09 MED ORDER — ZOLPIDEM TARTRATE 5 MG PO TABS
5.0000 mg | ORAL_TABLET | Freq: Every evening | ORAL | Status: DC | PRN
  Administered 2013-06-09: 5 mg via ORAL
  Filled 2013-06-09: qty 1

## 2013-06-09 MED ORDER — MELOXICAM 15 MG PO TABS
15.0000 mg | ORAL_TABLET | Freq: Every day | ORAL | Status: DC
  Administered 2013-06-09: 15 mg via ORAL
  Filled 2013-06-09 (×2): qty 1

## 2013-06-09 MED ORDER — SODIUM CHLORIDE 0.9 % IV BOLUS (SEPSIS)
1000.0000 mL | Freq: Once | INTRAVENOUS | Status: AC
  Administered 2013-06-09: 1000 mL via INTRAVENOUS

## 2013-06-09 MED ORDER — ONDANSETRON HCL 4 MG PO TABS: 4.0000 mg | ORAL_TABLET | Freq: Three times a day (TID) | ORAL | Status: DC | PRN

## 2013-06-09 MED ORDER — METFORMIN HCL 500 MG PO TABS
500.0000 mg | ORAL_TABLET | Freq: Two times a day (BID) | ORAL | Status: DC
  Administered 2013-06-10: 500 mg via ORAL
  Filled 2013-06-09: qty 1

## 2013-06-09 MED ORDER — ALUM & MAG HYDROXIDE-SIMETH 200-200-20 MG/5ML PO SUSP: 30.0000 mL | ORAL | Status: DC | PRN

## 2013-06-09 MED ORDER — DULOXETINE HCL 60 MG PO CPEP
60.0000 mg | ORAL_CAPSULE | Freq: Every day | ORAL | Status: DC
  Administered 2013-06-09: 60 mg via ORAL
  Filled 2013-06-09 (×2): qty 1

## 2013-06-09 MED ORDER — IBUPROFEN 400 MG PO TABS: 600.0000 mg | ORAL_TABLET | Freq: Three times a day (TID) | ORAL | Status: DC | PRN

## 2013-06-09 NOTE — ED Notes (Signed)
Personal belongings bag at nurses station

## 2013-06-09 NOTE — ED Notes (Addendum)
Pt reports getting more depressed in last couple of months. States that she was put on Remeron but that she had a "bad reaction" to it. States that med made her extremely restless and agitated. Stopped taking med but did not follow up with [facility] for a change of medication. Pt has history of bipolar disorder and thinks that she is in the depression cycle of the disorder. Pt states that she feel hopeless and feels suicidal but with no active plan. Pt changed into blue scrubs, wanded by security, and personal belongings given to her daughter to take home.

## 2013-06-09 NOTE — ED Notes (Signed)
Charlynn Grimes RN and Chrisandra Netters. Charge RN informed of need for sitter, there is no sitter available at this time.

## 2013-06-09 NOTE — ED Provider Notes (Signed)
History     CSN: [id number]  Arrival date & time 06/09/13  4098   First MD Initiated Contact with Patient 06/09/13 1918      Chief Complaint  Patient presents with  . Anxiety    (Consider location/radiation/quality/duration/timing/severity/associated sxs/prior treatment) HPI Comments: Patient presents with a chief complaint of anxiety, depression, and agitation.  She reports that she has a history of Bipolar Disorder and is currently on Cymbalta and Vistaril.  She is followed by [facility].  She reports that she is taking her medications, but does not feel that her medications are working.  She reports that she has been having suicidal thoughts, but no plan.  She has attempted suicide by overdosing on Seroquel in the past.  She denies HI.  Denies alcohol or recreational drug use.   She also has a history of DM and is currently on Metformin.  She has not been taking her Metformin.  Patient is a 57 y.o. female presenting with anxiety. The history is provided by the patient.  Anxiety    Past Medical History  Diagnosis Date  . Diabetes mellitus without complication   . Hypertension   . Arthritis   . Depression     Past Surgical History  Procedure Laterality Date  . Tubal ligation    . Nasal septum surgery      History reviewed. No pertinent family history.  History  Substance Use Topics  . Smoking status: Never Smoker   . Smokeless tobacco: Not on file  . Alcohol Use: No    OB History   Grav Para Term Preterm Abortions TAB SAB Ect Mult Living                  Review of Systems  Psychiatric/Behavioral: Positive for dysphoric mood and agitation. Negative for suicidal ideas, hallucinations and self-injury.  All other systems reviewed and are negative.    Allergies  Codeine; Flagyl; Morphine and related; and Remeron  Home Medications   Current Outpatient Rx  Name  Route  Sig  Dispense  Refill  . DULoxetine (CYMBALTA) 60 MG capsule   Oral   Take 60 mg by mouth  daily.         . enalapril (VASOTEC) 10 MG tablet   Oral   Take 10 mg by mouth daily.         . hydrOXYzine (ATARAX/VISTARIL) 25 MG tablet   Oral   Take 100 mg by mouth 2 (two) times daily. Prescribed as once daily.         . meloxicam (MOBIC) 15 MG tablet   Oral   Take 15 mg by mouth daily.         . metFORMIN (GLUCOPHAGE) 500 MG tablet   Oral   Take 500 mg by mouth 2 (two) times daily with a meal.           BP 146/93  Pulse 73  Temp(Src) 97.7 F (36.5 C) (Oral)  Resp 14  SpO2 100%  Physical Exam  Nursing note and vitals reviewed. Constitutional: She appears well-developed and well-nourished.  HENT:  Head: Normocephalic and atraumatic.  Eyes: EOM are normal. Pupils are equal, round, and reactive to light.  Neck: Normal range of motion. Neck supple.  Cardiovascular: Normal rate, regular rhythm and normal heart sounds.   Pulmonary/Chest: Effort normal and breath sounds normal.  Neurological: She is alert.  Skin: Skin is warm and dry.  Psychiatric: Her speech is normal and behavior is normal. Her mood appears  anxious. She exhibits a depressed mood. She expresses suicidal ideation. She expresses no homicidal ideation. She expresses no suicidal plans and no homicidal plans.    ED Course  Procedures (including critical care time)  Labs Reviewed  COMPREHENSIVE METABOLIC PANEL - Abnormal; Notable for the following:    Glucose, Bld 349 (*)    Creatinine, Ser 0.44 (*)    Total Bilirubin 0.2 (*)    All other components within normal limits  SALICYLATE LEVEL - Abnormal; Notable for the following:    Salicylate Lvl <2.0 (*)    All other components within normal limits  ACETAMINOPHEN LEVEL  CBC  ETHANOL  URINE RAPID DRUG SCREEN (HOSP PERFORMED)  POCT PREGNANCY, URINE   No results found.   No diagnosis found.  Patient discussed with Dr. Bernette Mayers and also with ACT team.  MDM  Patient presents with a chief complaint of anxiety, depression, and agitation.   Also with vague suicidal thoughts.  No suicidal plan.  Telepsych ordered.  Psych holding orders have been placed.  Patient also found to have an elevated blood sugar of 354.  Anion gap=8.  Patient given 1 Liter IVF and started back on Metformin, which she has not been taking as prescribed.        Pascal Lux Buford, PA-C 06/09/13 2351

## 2013-06-09 NOTE — ED Notes (Addendum)
Pt presents to ED for anxiety. Pt states shse has thoughts of harming herself but no plan and does not trust herself.

## 2013-06-10 MED ORDER — ENALAPRIL MALEATE 10 MG PO TABS: 10.0000 mg | ORAL_TABLET | Freq: Every day | ORAL | Status: DC

## 2013-06-10 MED ORDER — MELOXICAM 15 MG PO TABS: 15.0000 mg | ORAL_TABLET | Freq: Every day | ORAL | Status: DC

## 2013-06-10 MED ORDER — METFORMIN HCL 500 MG PO TABS: 500.0000 mg | ORAL_TABLET | Freq: Two times a day (BID) | ORAL | Status: DC

## 2013-06-10 NOTE — BH Assessment (Signed)
Assessment Note   Patient is a 57 year old female. Patient reports frequent mood swings, depression and anxiety.  Patient reports that the medication that she is currently taking is no longer working.  Patient reports decreased sleep, irritability and poor judgement.  Patient reports a prior history psychiatric hospitalizations, medication managements  and outpatient therapy.  Patient reports that she is able to contract for safety.  Patient denies substance abuse issues.  Patient denies HI.  Patient denies psychosis.    Axis I: Bipolar, Depressed Axis II: Deferred Axis III:  Past Medical History  Diagnosis Date  . Diabetes mellitus without complication   . Hypertension   . Arthritis   . Depression    Axis IV: economic problems, other psychosocial or environmental problems, problems related to social environment, problems with access to health care services and problems with primary support group Axis V: 31-40 impairment in reality testing  Past Medical History:  Past Medical History  Diagnosis Date  . Diabetes mellitus without complication   . Hypertension   . Arthritis   . Depression     Past Surgical History  Procedure Laterality Date  . Tubal ligation    . Nasal septum surgery      Family History: History reviewed. No pertinent family history.  Social History:  reports that she has never smoked. She does not have any smokeless tobacco history on file. She reports that she does not drink alcohol or use illicit drugs.  Additional Social History:     CIWA: CIWA-Ar BP: 142/60 mmHg Pulse Rate: 80 COWS:    Allergies:  Allergies  Allergen Reactions  . Codeine Nausea And Vomiting    States that she cannot take in high doses  . Flagyl (Metronidazole) Other (See Comments)    Fast heartbeat,tingly  . Morphine And Related Nausea And Vomiting    States that she cannot take in high doses  . Remeron (Mirtazapine) Other (See Comments)    Severe  restlessness,irritable,hyperactivity,agitation    Home Medications:  (Not in a hospital admission)  OB/GYN Status:  No LMP recorded. Patient is postmenopausal.  General Assessment Data Location of Assessment: [facility] ED ACT Assessment: Yes Living Arrangements: Spouse/significant other Can pt return to current living arrangement?: Yes Admission Status: Voluntary Is patient capable of signing voluntary admission?: Yes Transfer from: Acute Hospital Referral Source: Self/Family/Friend  Education Status Is patient currently in school?: No  Risk to self Suicidal Ideation: No Suicidal Intent: No Is patient at risk for suicide?: No Suicidal Plan?: No Access to Means: No What has been your use of drugs/alcohol within the last 12 months?: None Previous Attempts/Gestures: Yes How many times?: 2 Other Self Harm Risks: None Triggers for Past Attempts: Unpredictable Intentional Self Injurious Behavior: None Family Suicide History: No Recent stressful life event(s): Financial Problems Persecutory voices/beliefs?: No Depression: Yes Depression Symptoms: Despondent;Insomnia;Isolating;Loss of interest in usual pleasures;Feeling worthless/self pity Substance abuse history and/or treatment for substance abuse?: No Suicide prevention information given to non-admitted patients: Not applicable  Risk to Others Homicidal Ideation: No Thoughts of Harm to Others: No Current Homicidal Intent: No Current Homicidal Plan: No Access to Homicidal Means: No Identified Victim: None History of harm to others?: No Assessment of Violence: None Noted Violent Behavior Description: None Does patient have access to weapons?: No Criminal Charges Pending?: No Does patient have a court date: No  Psychosis Hallucinations: None noted Delusions: None noted  Mental Status Report Appear/Hygiene: Disheveled Eye Contact: Fair Motor Activity: Freedom of movement Speech: Logical/coherent Level  of Consciousness:  Alert Mood: Depressed Affect: Depressed Anxiety Level: Minimal Thought Processes: Coherent;Relevant Judgement: Unimpaired Orientation: Person;Place;Time;Situation Obsessive Compulsive Thoughts/Behaviors: None  Cognitive Functioning Concentration: Decreased Memory: Recent Intact;Remote Intact IQ: Average Insight: Poor Impulse Control: Poor Appetite: Fair Weight Loss: 0 Weight Gain: 0 Sleep: Decreased Total Hours of Sleep: 7 Vegetative Symptoms: None  ADLScreening [facility] Assessment Services) Patient's cognitive ability adequate to safely complete daily activities?: Yes Patient able to express need for assistance with ADLs?: Yes Independently performs ADLs?: Yes (appropriate for developmental age)  Abuse/Neglect [facility]) Physical Abuse: Denies Verbal Abuse: Denies Sexual Abuse: Denies  Prior Inpatient Therapy Prior Inpatient Therapy: Yes Prior Therapy Dates: 2014,2013,2012 Prior Therapy Facilty/Provider(s): [facility], [facility] and [facility]  Reason for Treatment: Depression   Prior Outpatient Therapy Prior Outpatient Therapy: Yes Prior Therapy Dates: ongoing,current  Prior Therapy Facilty/Provider(s): [facility] Reason for Treatment: Depression   ADL Screening (condition at time of admission) Patient's cognitive ability adequate to safely complete daily activities?: Yes Patient able to express need for assistance with ADLs?: Yes Independently performs ADLs?: Yes (appropriate for developmental age)       Abuse/Neglect Assessment (Assessment to be complete while patient is alone) Physical Abuse: Denies Verbal Abuse: Denies Sexual Abuse: Denies          Additional Information 1:1 In Past 12 Months?: No CIRT Risk: No Elopement Risk: No Does patient have medical clearance?: No     Disposition:   Pending Tele Psych.  Disposition Initial Assessment Completed for this Encounter: Yes Disposition of Patient: Other dispositions Other  disposition(s): Other (Comment)  On Site Evaluation by:   Reviewed with Physician:     Phillip Heal LaVerne 06/10/2013 2:32 AM

## 2013-06-10 NOTE — ED Notes (Signed)
Telepsych complete

## 2013-06-10 NOTE — ED Provider Notes (Signed)
Pt  Feels out of control . Not slleeping for prolonged periods . Angry sometimes. Denies si or HI  Doug Sou, MD 06/10/13 8119

## 2013-06-10 NOTE — ED Provider Notes (Signed)
Medical screening examination/treatment/procedure(s) were performed by non-physician practitioner and as supervising physician I was immediately available for consultation/collaboration.   Charles B. Bernette Mayers, MD 06/10/13 1539

## 2013-06-10 NOTE — ED Notes (Signed)
Pt"s CBG was 257  when I checked it.7:08 am JG.

## 2013-06-10 NOTE — Progress Notes (Signed)
Patient provided with written resources for [facility].Patients daughter also present.Patient and daughter educated Re- support groups for anxiety in [place].  Patient verbalized her Understanding of  Written resources, patient and daughter receptive of  Written  Resources and had no questions at this time.

## 2013-06-10 NOTE — ED Provider Notes (Signed)
PSY consult - DR Penalver recommends [place] for discharge and [facility] follow up. ACT to provide referrals and resources  Sunnie Nielsen, MD 06/10/13 [phone]

## 2013-07-06 ENCOUNTER — Encounter (HOSPITAL_COMMUNITY): Payer: Self-pay

## 2013-07-06 ENCOUNTER — Emergency Department (HOSPITAL_COMMUNITY)
Admission: EM | Admit: 2013-07-06 | Discharge: 2013-07-06 | Disposition: A | Discharge status: Other Acute Inpatient Hospital | Payer: MEDICAID | Attending: Emergency Medicine | Admitting: Emergency Medicine

## 2013-07-06 ENCOUNTER — Inpatient Hospital Stay (HOSPITAL_COMMUNITY)
Admission: EM | Admit: 2013-07-06 | Discharge: 2013-07-12 | DRG: 430 | Disposition: A | Discharge status: Home / Self Care | Payer: MEDICAID | Source: Intra-hospital | Attending: Psychiatry | Admitting: Psychiatry

## 2013-07-06 ENCOUNTER — Encounter (HOSPITAL_COMMUNITY): Payer: Self-pay | Admitting: Emergency Medicine

## 2013-07-06 DIAGNOSIS — R112 Nausea with vomiting, unspecified: Secondary | ICD-10-CM | POA: Insufficient documentation

## 2013-07-06 DIAGNOSIS — Z9851 Tubal ligation status: Secondary | ICD-10-CM | POA: Insufficient documentation

## 2013-07-06 DIAGNOSIS — F329 Major depressive disorder, single episode, unspecified: Secondary | ICD-10-CM

## 2013-07-06 DIAGNOSIS — M62838 Other muscle spasm: Secondary | ICD-10-CM | POA: Insufficient documentation

## 2013-07-06 DIAGNOSIS — I10 Essential (primary) hypertension: Secondary | ICD-10-CM | POA: Diagnosis present

## 2013-07-06 DIAGNOSIS — M129 Arthropathy, unspecified: Secondary | ICD-10-CM | POA: Diagnosis present

## 2013-07-06 DIAGNOSIS — R45851 Suicidal ideations: Secondary | ICD-10-CM

## 2013-07-06 DIAGNOSIS — Z79899 Other long term (current) drug therapy: Secondary | ICD-10-CM

## 2013-07-06 DIAGNOSIS — G4710 Hypersomnia, unspecified: Secondary | ICD-10-CM | POA: Diagnosis present

## 2013-07-06 DIAGNOSIS — G479 Sleep disorder, unspecified: Secondary | ICD-10-CM | POA: Insufficient documentation

## 2013-07-06 DIAGNOSIS — R630 Anorexia: Secondary | ICD-10-CM | POA: Insufficient documentation

## 2013-07-06 DIAGNOSIS — R0682 Tachypnea, not elsewhere classified: Secondary | ICD-10-CM | POA: Insufficient documentation

## 2013-07-06 DIAGNOSIS — E119 Type 2 diabetes mellitus without complications: Secondary | ICD-10-CM | POA: Insufficient documentation

## 2013-07-06 DIAGNOSIS — F32A Depression, unspecified: Secondary | ICD-10-CM

## 2013-07-06 DIAGNOSIS — F319 Bipolar disorder, unspecified: Secondary | ICD-10-CM

## 2013-07-06 DIAGNOSIS — F3289 Other specified depressive episodes: Secondary | ICD-10-CM | POA: Insufficient documentation

## 2013-07-06 DIAGNOSIS — R634 Abnormal weight loss: Secondary | ICD-10-CM | POA: Insufficient documentation

## 2013-07-06 DIAGNOSIS — R000 Tachycardia, unspecified: Secondary | ICD-10-CM | POA: Insufficient documentation

## 2013-07-06 DIAGNOSIS — R259 Unspecified abnormal involuntary movements: Secondary | ICD-10-CM | POA: Insufficient documentation

## 2013-07-06 DIAGNOSIS — F411 Generalized anxiety disorder: Secondary | ICD-10-CM | POA: Diagnosis present

## 2013-07-06 DIAGNOSIS — R109 Unspecified abdominal pain: Secondary | ICD-10-CM | POA: Insufficient documentation

## 2013-07-06 DIAGNOSIS — R413 Other amnesia: Secondary | ICD-10-CM | POA: Diagnosis present

## 2013-07-06 DIAGNOSIS — F3130 Bipolar disorder, current episode depressed, mild or moderate severity, unspecified: Principal | ICD-10-CM | POA: Diagnosis present

## 2013-07-06 DIAGNOSIS — F39 Unspecified mood [affective] disorder: Secondary | ICD-10-CM | POA: Insufficient documentation

## 2013-07-06 DIAGNOSIS — Z8739 Personal history of other diseases of the musculoskeletal system and connective tissue: Secondary | ICD-10-CM | POA: Insufficient documentation

## 2013-07-06 LAB — RAPID URINE DRUG SCREEN, HOSP PERFORMED
Benzodiazepines: NOT DETECTED
Cocaine: NOT DETECTED
Opiates: NOT DETECTED

## 2013-07-06 LAB — CBC
Hemoglobin: 12.5 g/dL (ref 12.0–15.0)
MCH: 28.2 pg (ref 26.0–34.0)
MCV: 84.9 fL (ref 78.0–100.0)
Platelets: 269 10*3/uL (ref 150–400)
RBC: 4.44 MIL/uL (ref 3.87–5.11)

## 2013-07-06 LAB — COMPREHENSIVE METABOLIC PANEL
BUN: 10 mg/dL (ref 6–23)
CO2: 29 mEq/L (ref 19–32)
Calcium: 9.5 mg/dL (ref 8.4–10.5)
Creatinine, Ser: 0.42 mg/dL — ABNORMAL LOW (ref 0.50–1.10)
GFR calc Af Amer: >90 mL/min (ref >90)
GFR calc non Af Amer: >90 mL/min (ref >90)
Glucose, Bld: 231 mg/dL — ABNORMAL HIGH (ref 70–99)

## 2013-07-06 LAB — GLUCOSE, CAPILLARY: Glucose-Capillary: 200 mg/dL — ABNORMAL HIGH (ref 70–99)

## 2013-07-06 MED ORDER — IBUPROFEN 200 MG PO TABS: 600.0000 mg | ORAL_TABLET | Freq: Three times a day (TID) | ORAL | Status: DC | PRN

## 2013-07-06 MED ORDER — ALUM & MAG HYDROXIDE-SIMETH 200-200-20 MG/5ML PO SUSP: 30.0000 mL | ORAL | Status: DC | PRN

## 2013-07-06 MED ORDER — TRAZODONE HCL 50 MG PO TABS: 50.0000 mg | ORAL_TABLET | Freq: Every evening | ORAL | Status: DC | PRN

## 2013-07-06 MED ORDER — LORAZEPAM 1 MG PO TABS: 1.0000 mg | ORAL_TABLET | Freq: Three times a day (TID) | ORAL | Status: DC | PRN

## 2013-07-06 MED ORDER — TRAZODONE HCL 50 MG PO TABS
50.0000 mg | ORAL_TABLET | Freq: Every evening | ORAL | Status: DC | PRN
  Administered 2013-07-06 – 2013-07-10 (×5): 50 mg via ORAL
  Filled 2013-07-06 (×8): qty 1
  Filled 2013-07-06: qty 10
  Filled 2013-07-06 (×2): qty 1
  Filled 2013-07-06: qty 10
  Filled 2013-07-06 (×3): qty 1

## 2013-07-06 MED ORDER — MAGNESIUM HYDROXIDE 400 MG/5ML PO SUSP: 30.0000 mL | Freq: Every day | ORAL | Status: DC | PRN

## 2013-07-06 MED ORDER — MELOXICAM 15 MG PO TABS
15.0000 mg | ORAL_TABLET | Freq: Every day | ORAL | Status: DC
  Administered 2013-07-07 – 2013-07-12 (×6): 15 mg via ORAL
  Filled 2013-07-06 (×8): qty 1
  Filled 2013-07-06: qty 2

## 2013-07-06 MED ORDER — ZOLPIDEM TARTRATE 5 MG PO TABS: 5.0000 mg | ORAL_TABLET | Freq: Every evening | ORAL | Status: DC | PRN

## 2013-07-06 MED ORDER — ENALAPRIL MALEATE 10 MG PO TABS
10.0000 mg | ORAL_TABLET | Freq: Every day | ORAL | Status: DC
  Administered 2013-07-07 – 2013-07-12 (×6): 10 mg via ORAL
  Filled 2013-07-06 (×8): qty 1

## 2013-07-06 MED ORDER — ENALAPRIL MALEATE 10 MG PO TABS
10.0000 mg | ORAL_TABLET | Freq: Every day | ORAL | Status: DC
  Administered 2013-07-06: 10 mg via ORAL
  Filled 2013-07-06: qty 1

## 2013-07-06 MED ORDER — METFORMIN HCL 500 MG PO TABS
500.0000 mg | ORAL_TABLET | Freq: Two times a day (BID) | ORAL | Status: DC
  Administered 2013-07-06: 500 mg via ORAL
  Filled 2013-07-06 (×2): qty 1

## 2013-07-06 MED ORDER — METFORMIN HCL 500 MG PO TABS
500.0000 mg | ORAL_TABLET | Freq: Two times a day (BID) | ORAL | Status: DC
  Administered 2013-07-07 – 2013-07-12 (×12): 500 mg via ORAL
  Filled 2013-07-06 (×16): qty 1

## 2013-07-06 MED ORDER — HYDRALAZINE HCL 10 MG PO TABS
10.0000 mg | ORAL_TABLET | Freq: Once | ORAL | Status: AC
  Administered 2013-07-06: 10 mg via ORAL
  Filled 2013-07-06: qty 1

## 2013-07-06 MED ORDER — ACETAMINOPHEN 325 MG PO TABS
650.0000 mg | ORAL_TABLET | Freq: Four times a day (QID) | ORAL | Status: DC | PRN
  Administered 2013-07-11: 650 mg via ORAL

## 2013-07-06 MED ORDER — SODIUM CHLORIDE 0.9 % IV BOLUS (SEPSIS)
1000.0000 mL | Freq: Once | INTRAVENOUS | Status: AC
  Administered 2013-07-06: 1000 mL via INTRAVENOUS

## 2013-07-06 MED ORDER — ONDANSETRON HCL 4 MG PO TABS: 4.0000 mg | ORAL_TABLET | Freq: Three times a day (TID) | ORAL | Status: DC | PRN

## 2013-07-06 NOTE — BHH Counselor (Signed)
Patient accepted to [facility] by Donell Sievert, PA to Dr. Elsie Saas. [phone]. The nursing report # is [id number].

## 2013-07-06 NOTE — Tx Team (Signed)
Initial Interdisciplinary Treatment Plan  PATIENT STRENGTHS: (choose at least two) Ability for insight Active sense of humor Average or above average intelligence Capable of independent living Communication skills Motivation for treatment/growth Physical Health Religious Affiliation Supportive family/friends  PATIENT STRESSORS: Financial difficulties Loss of father in 2012 Medication change or noncompliance   PROBLEM LIST: Problem List/Patient Goals Date to be addressed Date deferred Reason deferred Estimated date of resolution  " I need a medication adjustment" 7/10     Depression 7/10                                                DISCHARGE CRITERIA:  Adequate post-discharge living arrangements Improved stabilization in mood, thinking, and/or behavior Reduction of life-threatening or endangering symptoms to within safe limits  PRELIMINARY DISCHARGE PLAN: Outpatient therapy Return to previous living arrangement  PATIENT/FAMIILY INVOLVEMENT: This treatment plan has been presented to and reviewed with the patient, Jaclyn [name], and/or family member, .  The patient and family have been given the opportunity to ask questions and make suggestions.  Jaclyn [name] [facility] 07/06/2013, 10:39 PM

## 2013-07-06 NOTE — ED Notes (Signed)
Pt states that she has stopped her medication of cymbalta on 7/3 due to she asked her doc told her not to come off the medication. Pt is not eating, shaking, n/v. Pt also has not had her bp medication since sat due to she ran out bp 213/108. Pt denies any si/hi states that she wants to change her medications and stop the cymbalta.

## 2013-07-06 NOTE — ED Notes (Signed)
Patient in blue scrubs and red socks.  

## 2013-07-06 NOTE — ED Provider Notes (Signed)
History    CSN: [id number] Arrival date & time 07/06/13  1046  First MD Initiated Contact with Patient 07/06/13 1051     Chief Complaint  Patient presents with  . Withdrawal   (Consider location/radiation/quality/duration/timing/severity/associated sxs/prior Treatment) HPI Comments: 57 y/o female with a PMHx of depression, anxiety and HTN presents to the ED with her daughter complaining of worsening depression "for a while now". She feels as if her cymbalta is no longer working, saw her psychiatrist on 7/3 and was told not to stop it which she did anyways. Despite triage summary, patient does feel suicidal without a plan and is scared because she has overdosed in the past. Denies HI. States she has not been sleeping well. Since stopping the medication, she has been feeling nauseated and shaky. Has decreased appetite and is losing weight. No diarrhea. Ran out of her blood pressure medication 5 days ago and has not had it refilled.   The history is provided by the patient and a relative.   Past Medical History  Diagnosis Date  . Diabetes mellitus without complication   . Hypertension   . Arthritis   . Depression    Past Surgical History  Procedure Laterality Date  . Tubal ligation    . Nasal septum surgery     No family history on file. History  Substance Use Topics  . Smoking status: Never Smoker   . Smokeless tobacco: Not on file  . Alcohol Use: No   OB History   Grav Para Term Preterm Abortions TAB SAB Ect Mult Living                 Review of Systems  Constitutional: Positive for activity change.  Respiratory: Negative for shortness of breath.   Cardiovascular: Negative for chest pain.  Gastrointestinal: Positive for nausea, vomiting and abdominal pain. Negative for diarrhea and constipation.  Psychiatric/Behavioral: Positive for suicidal ideas, sleep disturbance and dysphoric mood. Negative for hallucinations and self-injury. The patient is nervous/anxious.   All  other systems reviewed and are negative.    Allergies  Codeine; Flagyl; Morphine and related; and Remeron  Home Medications   Current Outpatient Rx  Name  Route  Sig  Dispense  Refill  . DULoxetine (CYMBALTA) 60 MG capsule   Oral   Take 60 mg by mouth daily.         . enalapril (VASOTEC) 10 MG tablet   Oral   Take 10 mg by mouth daily.         . hydrOXYzine (ATARAX/VISTARIL) 25 MG tablet   Oral   Take 100 mg by mouth 2 (two) times daily. Prescribed as once daily.         . meloxicam (MOBIC) 15 MG tablet   Oral   Take 15 mg by mouth daily.         . meloxicam (MOBIC) 15 MG tablet   Oral   Take 1 tablet (15 mg total) by mouth daily.   10 tablet   0   . metFORMIN (GLUCOPHAGE) 500 MG tablet   Oral   Take 500 mg by mouth 2 (two) times daily with a meal.         . metFORMIN (GLUCOPHAGE) 500 MG tablet   Oral   Take 1 tablet (500 mg total) by mouth 2 (two) times daily with a meal.   14 tablet   0    BP 213/108  Pulse 108  Temp(Src) 98 F (36.7 C) (Oral)  Resp  26  SpO2 100% Physical Exam  Nursing note and vitals reviewed. Constitutional: She is oriented to person, place, and time. She appears well-developed and well-nourished. No distress.  Tearful, crying  Cardiovascular: Regular rhythm, normal heart sounds and normal pulses.  Tachycardia present.   Pulmonary/Chest: Breath sounds normal. Tachypnea (mild) noted. No respiratory distress.  Abdominal: Soft. Normal appearance and bowel sounds are normal. She exhibits no distension and no mass. There is no tenderness.  Musculoskeletal: Normal range of motion. She exhibits no edema.  Neurological: She is alert and oriented to person, place, and time.  Skin: Skin is warm and dry. She is not diaphoretic. No erythema.  Psychiatric: Her speech is normal and behavior is normal. Her mood appears anxious. She exhibits a depressed mood. She expresses suicidal ideation. She expresses no homicidal ideation. She expresses  no suicidal plans.  Poor eye contact.    ED Course  Procedures (including critical care time) Labs Reviewed  CBC  COMPREHENSIVE METABOLIC PANEL  URINE RAPID DRUG SCREEN (HOSP PERFORMED)  ETHANOL  SALICYLATE LEVEL  ACETAMINOPHEN LEVEL   Date: 07/06/2013  Rate: 73  Rhythm: normal sinus rhythm  QRS Axis: normal  Intervals: normal  ST/T Wave abnormalities: normal  Conduction Disutrbances:none  Narrative Interpretation: no stemi, normal EKG  Old EKG Reviewed: unchanged   No results found. No diagnosis found.  MDM  Patient with worsening depression, SI, withdrawal from Cymbalta x 1 week. Psych labs pending. BP 170/118 on my initial examination. Home BP meds will be given. EKG. Will watch patient in ED, fluids, psych holding meds. Plan to have patient evaluated by psych pending medical clearance.  12:41 PM Patient reports she is feeling much better. BP 156/78. She is medically cleared to move to psych ED.   Trevor Mace, PA-C 07/06/13 [phone]

## 2013-07-06 NOTE — BH Assessment (Signed)
Assessment Note   Jaclyn [name] is an 57 y.o. female with history off depression and anxiety. She presents here to [facility] with her daughter complaining of worsening depression "for a while now". She feels as if her cymbalta is no longer working, saw her psychiatrist on 7/3 at [facility] and was told not to stop it which she did anyways. Patient sts that she terminated using her medication due to the following side effects: not sleeping week, poor appetite, loosing wt., muscle spasms, increased anxiety, [place], and crying spells. Pt afraid that she will not be normal or get on the right medications. She reports this medication issue has triggered suicidal thoughts. She denies plan or prior attempts. She denies HI and AVH's. Pt reports previous hosptializations for mental health issues. No alcohol or drug use.      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 Diabetes mellitus without complication   . Hypertension   . Arthritis   . Depression    Axis IV: other psychosocial or environmental problems, problems related to social environment, problems with access to health care services and problems with primary support group Axis V: 31-40 impairment in reality testing  Past Medical History:  Past Medical History  Diagnosis Date  . Diabetes mellitus without complication   . Hypertension   . Arthritis   . Depression     Past Surgical History  Procedure Laterality Date  . Tubal ligation    . Nasal septum surgery      Family History: History reviewed. No pertinent family history.  Social History:  reports that she has never smoked. She does not have any smokeless tobacco history on file. She reports that she does not drink alcohol or use illicit drugs.  Additional Social History:     CIWA: CIWA-Ar BP: 164/95 mmHg Pulse Rate: 74 COWS:    Allergies:  Allergies  Allergen Reactions  . Codeine  Nausea And Vomiting    States that she cannot take in high doses  . Flagyl (Metronidazole) Other (See Comments)    Fast heartbeat,tingly  . Morphine And Related Nausea And Vomiting    States that she cannot take in high doses  . Remeron (Mirtazapine) Other (See Comments)    Severe restlessness,irritable,hyperactivity,agitation    Home Medications:  (Not in a hospital admission)  OB/GYN Status:  No LMP recorded. Patient is postmenopausal.  General Assessment Data Location of Assessment: [facility] ED Living Arrangements: Other (Comment) (daughter) Can pt return to current living arrangement?: Yes Admission Status: Voluntary Transfer from: Acute Hospital Referral Source: Self/Family/Friend     Risk to self Suicidal Ideation: Yes-Currently Present Suicidal Intent: No Is patient at risk for suicide?: No Suicidal Plan?: No Access to Means: No What has been your use of drugs/alcohol within the last 12 months?:  (n/a) Previous Attempts/Gestures: No How many times?:  (n/a) Other Self Harm Risks:  (n/a) Triggers for Past Attempts: Unpredictable Intentional Self Injurious Behavior: None Family Suicide History: No Recent stressful life event(s): Financial Problems Persecutory voices/beliefs?: No Depression: Yes Depression Symptoms: Despondent Substance abuse history and/or treatment for substance abuse?: No Suicide prevention information given to non-admitted patients: Not applicable  Risk to Others Homicidal Ideation: No Thoughts of Harm to Others: No Current Homicidal Intent: No Current Homicidal Plan: No Access to Homicidal Means: No Identified Victim:  (n/a) History of harm to others?: No Assessment of Violence: None Noted Violent Behavior Description:  (patient  is calm and cooperative) Does patient have access to weapons?: No Criminal Charges Pending?: No Does patient have a court date: No  Psychosis Hallucinations: None noted Delusions: None noted  Mental Status  Report Appear/Hygiene: Disheveled Eye Contact: Fair Motor Activity: Freedom of movement Speech: Logical/coherent Level of Consciousness: Alert Mood: Depressed Affect: Depressed Anxiety Level: Minimal Thought Processes: Coherent Judgement: Unimpaired Orientation: Person;Place;Time;Situation Obsessive Compulsive Thoughts/Behaviors: None  Cognitive Functioning Concentration: Decreased Memory: Remote Intact;Recent Intact IQ: Average Insight: Poor Impulse Control: Poor Appetite: Fair Weight Loss:  (0) Weight Gain:  (0) Sleep: Decreased Total Hours of Sleep:  (7 hours of sleep of night) Vegetative Symptoms: None  ADLScreening [facility] Assessment Services) Patient's cognitive ability adequate to safely complete daily activities?: Yes Patient able to express need for assistance with ADLs?: Yes Independently performs ADLs?: Yes (appropriate for developmental age)  Abuse/Neglect [facility]) Physical Abuse: Denies Verbal Abuse: Denies Sexual Abuse: Denies  Prior Inpatient Therapy Prior Inpatient Therapy: Yes Prior Therapy Dates: 2014,2013,2012 Prior Therapy Facilty/Provider(s): [facility], [facility] and [facility]  Reason for Treatment: Depression   Prior Outpatient Therapy Prior Outpatient Therapy: Yes Prior Therapy Dates: ongoing,current  Prior Therapy Facilty/Provider(s): [facility] Reason for Treatment: Depression   ADL Screening (condition at time of admission) Patient's cognitive ability adequate to safely complete daily activities?: Yes Patient able to express need for assistance with ADLs?: Yes Independently performs ADLs?: Yes (appropriate for developmental age)       Abuse/Neglect Assessment (Assessment to be complete while patient is alone) Physical Abuse: Denies Verbal Abuse: Denies Sexual Abuse: Denies Values / Beliefs Cultural Requests During Hospitalization: None Spiritual Requests During Hospitalization: None        Additional  Information 1:1 In Past 12 Months?: No CIRT Risk: No Elopement Risk: No Does patient have medical clearance?: No     Disposition:  Disposition Initial Assessment Completed for this Encounter: Yes Disposition of Patient: Other dispositions Other disposition(s): Other (Comment) (Pt accepted to [facility] by Donell Sievert, PA )  On Site Evaluation by:   Reviewed with Physician:     Melynda Ripple [facility] 07/06/2013 8:55 PM

## 2013-07-06 NOTE — Consult Note (Signed)
Reason for Consult:Eval for IP psychiatric mgmt Referring Physician: [facility] EDP  Jaclyn [name] is an 57 y.o. female.  HPI: Pt is a 57 y/o WF, with Hx of mood d/o and prior SA via drug O/D 10 /2013. Pt endorses continued depressive sx including mood swings, irritability, crying spells, decreased sleep and concentration. Patient endorses passive SI but denies HI or AVH, but cannot contract for safety at this time. Pt has recently been seen at [facility] psych ED 05/2013 for similar concerns , was tele psyched and D/c home with plan for OP mgmt at [facility]. Patient has not been able to tolerate prior Rx psychotropics i.e. Cymbalta, Remeron and Vistaril in addition to having difficulty getting OP therapy within a reasonable time frame.  Past Medical History  Diagnosis Date  . Diabetes mellitus without complication   . Hypertension   . Arthritis   . Depression     Past Surgical History  Procedure Laterality Date  . Tubal ligation    . Nasal septum surgery      History reviewed. No pertinent family history.  Social History:  reports that she has never smoked. She does not have any smokeless tobacco history on file. She reports that she does not drink alcohol or use illicit drugs.  Allergies:  Allergies  Allergen Reactions  . Codeine Nausea And Vomiting    States that she cannot take in high doses  . Flagyl (Metronidazole) Other (See Comments)    Fast heartbeat,tingly  . Morphine And Related Nausea And Vomiting    States that she cannot take in high doses  . Remeron (Mirtazapine) Other (See Comments)    Severe restlessness,irritable,hyperactivity,agitation    Medications: I have reviewed the patient's current medications.  Results for orders placed during the hospital encounter of 07/06/13 (from the past 48 hour(s))  CBC     Status: None   Collection Time    07/06/13 11:20 AM      Result Value Range   WBC 8.8  4.0 - 10.5 K/uL   RBC 4.44  3.87 - 5.11 MIL/uL   Hemoglobin 12.5  12.0 - 15.0 g/dL    HCT 21.3  08.6 - 57.8 %   MCV 84.9  78.0 - 100.0 fL   MCH 28.2  26.0 - 34.0 pg   MCHC 33.2  30.0 - 36.0 g/dL   RDW 46.9  62.9 - 52.8 %   Platelets 269  150 - 400 K/uL  COMPREHENSIVE METABOLIC PANEL     Status: Abnormal   Collection Time    07/06/13 11:20 AM      Result Value Range   Sodium 134 (*) 135 - 145 mEq/L   Potassium 3.8  3.5 - 5.1 mEq/L   Chloride 96  96 - 112 mEq/L   CO2 29  19 - 32 mEq/L   Glucose, Bld 231 (*) 70 - 99 mg/dL   BUN 10  6 - 23 mg/dL   Creatinine, Ser 4.13 (*) 0.50 - 1.10 mg/dL   Calcium 9.5  8.4 - 24.4 mg/dL   Total Protein 7.3  6.0 - 8.3 g/dL   Albumin 3.9  3.5 - 5.2 g/dL   AST 11  0 - 37 U/L   ALT 10  0 - 35 U/L   Alkaline Phosphatase 68  39 - 117 U/L   Total Bilirubin 0.3  0.3 - 1.2 mg/dL   GFR calc non Af Amer >90  >90 mL/min   GFR calc Af Amer >90  >90 mL/min  Comment:            The eGFR has been calculated     using the CKD EPI equation.     This calculation has not been     validated in all clinical     situations.     eGFR's persistently     <90 mL/min signify     possible Chronic Kidney Disease.  ETHANOL     Status: None   Collection Time    07/06/13 11:20 AM      Result Value Range   Alcohol, Ethyl (B) <11  0 - 11 mg/dL   Comment:            LOWEST DETECTABLE LIMIT FOR     SERUM ALCOHOL IS 11 mg/dL     FOR MEDICAL PURPOSES ONLY  SALICYLATE LEVEL     Status: Abnormal   Collection Time    07/06/13 11:20 AM      Result Value Range   Salicylate Lvl <2.0 (*) 2.8 - 20.0 mg/dL  ACETAMINOPHEN LEVEL     Status: None   Collection Time    07/06/13 11:20 AM      Result Value Range   Acetaminophen (Tylenol), Serum <15.0  10 - 30 ug/mL   Comment:            THERAPEUTIC CONCENTRATIONS VARY     SIGNIFICANTLY. A RANGE OF 10-30     ug/mL MAY BE AN EFFECTIVE     CONCENTRATION FOR MANY PATIENTS.     HOWEVER, SOME ARE BEST TREATED     AT CONCENTRATIONS OUTSIDE THIS     RANGE.     ACETAMINOPHEN CONCENTRATIONS     >150 ug/mL AT 4 HOURS  AFTER     INGESTION AND >50 ug/mL AT 12     HOURS AFTER INGESTION ARE     OFTEN ASSOCIATED WITH TOXIC     REACTIONS.  URINE RAPID DRUG SCREEN (HOSP PERFORMED)     Status: None   Collection Time    07/06/13 11:42 AM      Result Value Range   Opiates NONE DETECTED  NONE DETECTED   Cocaine NONE DETECTED  NONE DETECTED   Benzodiazepines NONE DETECTED  NONE DETECTED   Amphetamines NONE DETECTED  NONE DETECTED   Tetrahydrocannabinol NONE DETECTED  NONE DETECTED   Barbiturates NONE DETECTED  NONE DETECTED   Comment:            DRUG SCREEN FOR MEDICAL PURPOSES     ONLY.  IF CONFIRMATION IS NEEDED     FOR ANY PURPOSE, NOTIFY LAB     WITHIN 5 DAYS.                LOWEST DETECTABLE LIMITS     FOR URINE DRUG SCREEN     Drug Class       Cutoff (ng/mL)     Amphetamine      1000     Barbiturate      200     Benzodiazepine   200     Tricyclics       300     Opiates          300     Cocaine          300     THC              50  GLUCOSE, CAPILLARY     Status: Abnormal   Collection Time  07/06/13  5:45 PM      Result Value Range   Glucose-Capillary 200 (*) 70 - 99 mg/dL    No results found.  Review of Systems  Constitutional: Positive for weight loss and malaise/fatigue.  Neurological: Positive for weakness.  Psychiatric/Behavioral: Positive for depression, suicidal ideas and memory loss. Negative for hallucinations and substance abuse. The patient is nervous/anxious and has insomnia.        Patient endorses passive SI and cannot contract for safety at this present time. Denies HI, AVH  All other systems reviewed and are negative.   Blood pressure 164/95, pulse 74, temperature 97.7 F (36.5 C), temperature source Oral, resp. rate 20, SpO2 97.00%. Physical Exam  Constitutional: She is oriented to person, place, and time.  Cachetic , malnourished appearing  HENT:  Head: Normocephalic.  Eyes: Pupils are equal, round, and reactive to light.  Neck: Neck supple. No thyromegaly  present.  Cardiovascular: Normal rate and regular rhythm.   Respiratory: Breath sounds normal.  GI: Soft. Bowel sounds are normal.  Neurological: She is alert and oriented to person, place, and time.  Skin: Skin is warm and dry.  Psychiatric:  Anxious with depressed affect, fair eye contact with good insight    Assessment/Plan: 1) Admit to 500 hall @ [facility] for crises mgmt, safety and stabilization of mood d/o with  SI 2) Social work to aid in OP psychiatric mgmt to decrease risk of readmissions 3) mgmt of applicable co-morbid conditions 4) Intensive IP psychotropic medications and psychotherapy  Jaclyn [name] E 07/06/2013, 8:28 PM

## 2013-07-06 NOTE — Progress Notes (Signed)
Patient ID: Jaclyn [name], female   DOB: Jul 23, 1956, 57 y.o.   MRN: [id number]  Patient is a 57yr old voluntary admission from [facility] ED. Pastient was admitted after stopping her cymbalta and vistaril this past week. States that the medication was making her vomit and she needs a med adjustment. Reports depression and anxiety. States that she does have some SI thoughts on and off but no active plan at this time. Reports difficulty sleeping and poor appetite. Has a hx of DM, HTN,  And Arthritis. BP elevated at ED and on admission. Reports ED gave her something earlier. Patient pleasant and cooperative with admission process. Negative urine drug screen. Non-smoker.

## 2013-07-07 ENCOUNTER — Encounter (HOSPITAL_COMMUNITY): Payer: Self-pay | Admitting: Psychiatry

## 2013-07-07 DIAGNOSIS — F319 Bipolar disorder, unspecified: Secondary | ICD-10-CM | POA: Diagnosis present

## 2013-07-07 LAB — GLUCOSE, CAPILLARY: Glucose-Capillary: 197 mg/dL — ABNORMAL HIGH (ref 70–99)

## 2013-07-07 LAB — TSH: TSH: 1.587 u[IU]/mL (ref 0.350–4.500)

## 2013-07-07 LAB — T4, FREE: Free T4: 1.34 ng/dL (ref 0.80–1.80)

## 2013-07-07 MED ORDER — GLUCERNA SHAKE PO LIQD
237.0000 mL | Freq: Every day | ORAL | Status: DC
  Administered 2013-07-07 – 2013-07-10 (×4): 237 mL via ORAL

## 2013-07-07 MED ORDER — ADULT MULTIVITAMIN W/MINERALS CH
1.0000 | ORAL_TABLET | Freq: Every day | ORAL | Status: DC
  Administered 2013-07-07 – 2013-07-12 (×6): 1 via ORAL
  Filled 2013-07-07 (×8): qty 1

## 2013-07-07 NOTE — Progress Notes (Addendum)
Jaclyn [name] is seen  OOB UAL on the 500 hall today...tolerated fair. SHe remains sad, blunted and has a flat affect. SHe talks slowly, she remains dressed in hospital-provided blue scrubs .    A SHe completes her AM self inventory on it she writes she cont to be pos for SI within the past 24 hrs ( but she contracts with this nurse for safety) and rates her depression and hopelessness "10/8" and says her DC plan is to " take her meds".    R Safety is in place and POC moves forward.
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
## 2013-07-07 NOTE — [facility] Group Notes (Signed)
[facility] LCSW Aftercare Discharge Planning Group Note   07/07/2013 8:45 AM  Participation Quality:  Alert and Appropriate   Mood/Affect:  Appropriate, Flat and Depressed  Depression Rating:  10  Anxiety Rating:  5  Thoughts of Suicide:  Pt denies SI/HI  Will you contract for safety?   Yes  Current AVH:  Pt denies  Plan for Discharge/Comments:  Pt attended discharge planning group and actively participated in group.  CSW provided pt with today's workbook.  Pt states that she was having problem with meds, resulting in this hospitalization.  Pt states that she lives in [place] with daughter and can return home there.  Pt states that she was going to [facility] in [place] but doesn't want to go back there for outpatient services.  CSW referred pt to [facility] for medication management and therapy.  No further needs voiced by pt at this time.    Transportation Means: Pt reports access to transportation  Supports: Pt states that her daughter is her main support  Reyes Ivan, LCSWA 07/07/2013 10:02 AM
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
## 2013-07-07 NOTE — Progress Notes (Signed)
Adult Psychoeducational Group Note  Date:  07/07/2013 Time:  12:46 PM  Group Topic/Focus:  Relapse Prevention Planning:   The focus of this group is to define relapse and discuss the need for planning to combat relapse.  Participation Level:  Minimal  Participation Quality:  Appropriate and Attentive  Affect:  Depressed and Flat  Cognitive:  Alert and Appropriate  Insight: Lacking  Engagement in Group:  Engaged  Modes of Intervention:  Activity, Discussion, Socialization and Support  Additional Comments:  Pt did come to group, but had to leave several times to speak with the doctor. Pt did ask at the end of group about how to not "feel like things will never get better" and received support from the group. The purpose of this group was for the patient to identify triggers, early warning signs and old behaviors that lead to relapse. Pt made a personalized relapse prevention plan that included different coping skills to prevent reverting to unhealthy behaviors.  Cathlean Cower 07/07/2013, 12:46 PM

## 2013-07-07 NOTE — Tx Team (Signed)
Interdisciplinary Treatment Plan Update (Adult)  Date: 07/07/2013  Time Reviewed:  9:45 AM  Progress in Treatment: Attending groups: Yes Participating in groups:  Yes Taking medication as prescribed:  Yes Tolerating medication:  Yes Family/Significant othe contact made: CSW assessing  Patient understands diagnosis:  Yes Discussing patient identified problems/goals with staff:  Yes Medical problems stabilized or resolved:  Yes Denies suicidal/homicidal ideation: Yes Issues/concerns per patient self-inventory:  Yes Other:  New problem(s) identified: N/A  Discharge Plan or Barriers: CSW assessing for appropriate referrals.  Reason for Continuation of Hospitalization: Anxiety Depression Medication Stabilization  Comments: N/A  Estimated length of stay: 3-5 days  For review of initial/current patient goals, please see plan of care.  Attendees: Patient:     Family:     Physician:  07/07/2013 10:15 AM   Nursing:   Lamount Cranker, RN 07/07/2013 10:15 AM   Clinical Social Worker:  Reyes Ivan, LCSWA 07/07/2013 10:15 AM   Other: Fransisca Kaufmann, NP 07/07/2013 10:15 AM   Other:  Frankey Shown, MA care coordination 07/07/2013 10:15 AM   Other:  Juline Patch, LCSW 07/07/2013 10:15 AM   Other:     Other:    Other:    Other:    Other:    Other:    Other:     Scribe for Treatment Team:   Carmina Miller, 07/07/2013 10:15 AM

## 2013-07-07 NOTE — [facility] Group Notes (Signed)
  [facility] LCSW Group Therapy  07/07/2013  1:15 PM   Type of Therapy:  Group Therapy  Participation Level:  Active  Participation Quality:  Appropriate and Attentive  Affect:  Appropriate, Flat and Depressed  Cognitive:  Alert and Appropriate  Insight:  Developing/Improving and Engaged  Engagement in Therapy:  Developing/Improving and Engaged  Modes of Intervention:  Clarification, Confrontation, Discussion, Education, Exploration, Limit-setting, Orientation, Problem-solving, Rapport Building, Dance movement psychotherapist, Socialization and Support  Summary of Progress/Problems: The topic for today was feelings about relapse.  Pt discussed what relapse prevention is to them and identified triggers that they are on the path to relapse.  Pt processed their feeling towards relapse and was able to relate to peers.  Pt discussed coping skills that can be used for relapse prevention.   Pt shared that she no longer has confidence in herself and low self worth.  Pt was able to process feeling scared of future relapses and feeling disowned by her family.  Pt discussed how this feeling of disownment from her family led to rage and self harm, leading to this hospitalization.  Pt was praised for sharing in group, as this was difficult for pt to do.  Pt actively participated and was engaged in group discussion.    Reyes Ivan, LCSWA 07/07/2013 2:42 PM

## 2013-07-07 NOTE — Progress Notes (Signed)
NUTRITION ASSESSMENT  Patient is underweight and meets criteria for severe malnutrition related to social/environmental causes AEB intake <50% for > 1 month and decreased muscle mass and body fat.    Pt identified as at risk on the Malnutrition Screen Tool  INTERVENTION: 1. Educated patient on the importance of nutrition and encouraged intake of food and beverages. 2. Discussed weight goals. 3. Supplements: Glucerna q HS 4.  MVI daily  NUTRITION DIAGNOSIS: Unintentional weight loss related to sub-optimal intake as evidenced by pt report.   Goal: Pt to meet >/= 90% of their estimated nutrition needs.  Monitor:  PO intake  Assessment:  Patient stated that she has had no appetite or thirst for the last 6 months which worsened over the past 2-3 months.  Patient would eat only a bowl of cereal.  Patient stated that this was due to medication side effects.  Patient reports UBW of 140 lbs but per chart, this does not appear to be recently.    Patient is underweight and meets criteria for severe malnutrition related to social/environmental causes AEB intake <50% for > 1 month and decreased muscle mass and body fat.     57 y.o. female  Height: Ht Readings from Last 1 Encounters:  07/06/13 5' 4.57" (1.64 m)    Weight: Wt Readings from Last 1 Encounters:  07/06/13 104 lb 8 oz (47.401 kg)    Weight Hx: Wt Readings from Last 10 Encounters:  07/06/13 104 lb 8 oz (47.401 kg)  12/23/12 107 lb 6 oz (48.705 kg)  11/12/12 108 lb 7.5 oz (49.2 kg)  08/27/08 121 lb 8 oz (55.112 kg)    BMI:  Body mass index is 17.62 kg/(m^2). Pt meets criteria for underweight based on current BMI.  Estimated Nutritional Needs: Kcal: 25-30 kcal/kg Protein: > 1 gram protein/kg Fluid: 1 ml/kcal  Diet Order: General Pt is also offered choice of unit snacks mid-morning and mid-afternoon.  Pt is eating as desired.   Lab results and medications reviewed.   Oran Rein, RD, LDN Clinical Inpatient  Dietitian Pager:  [phone] Weekend and after hours pager:  [phone]

## 2013-07-08 DIAGNOSIS — F332 Major depressive disorder, recurrent severe without psychotic features: Secondary | ICD-10-CM

## 2013-07-08 LAB — GLUCOSE, CAPILLARY
Glucose-Capillary: 201 mg/dL — ABNORMAL HIGH (ref 70–99)
Glucose-Capillary: 293 mg/dL — ABNORMAL HIGH (ref 70–99)
Glucose-Capillary: 202 mg/dL — ABNORMAL HIGH (ref 70–99)
Glucose-Capillary: 198 mg/dL — ABNORMAL HIGH (ref 70–99)
Glucose-Capillary: 184 mg/dL — ABNORMAL HIGH (ref 70–99)

## 2013-07-08 MED ORDER — SERTRALINE HCL 25 MG PO TABS
25.0000 mg | ORAL_TABLET | Freq: Every day | ORAL | Status: DC
  Administered 2013-07-08 – 2013-07-11 (×4): 25 mg via ORAL
  Filled 2013-07-08 (×6): qty 1

## 2013-07-08 MED ORDER — RISPERIDONE 0.25 MG PO TABS
0.2500 mg | ORAL_TABLET | Freq: Two times a day (BID) | ORAL | Status: DC
  Administered 2013-07-08 – 2013-07-10 (×4): 0.25 mg via ORAL
  Filled 2013-07-08 (×9): qty 1

## 2013-07-08 NOTE — Progress Notes (Signed)
Adult Psychoeducational Group Note  Date:  07/08/2013 Time:  3:28 PM  Group Topic/Focus:  Identifying Needs:   The focus of this group is to help patients identify their personal needs that have been historically problematic and identify healthy behaviors to address their needs.  Participation Level:  Minimal  Participation Quality:  Appropriate, Sharing and Supportive  Affect:  Appropriate  Cognitive:  Appropriate  Insight: Appropriate  Engagement in Group:  Engaged and Supportive  Modes of Intervention:  Discussion, Education, Role-play, Socialization and Support  Additional Comments:  Patients will increase their understanding of their needs by listing at least 2 personal needs that they see as an issue that brought them into the hospital. Patient will identify at least one benefit and obstacle in continuing to use the same unhealthy behaviors Pt. will identify one healthy behavior to begin using within the first 24 hours of group. Pt attended group. Pt stated her energy level was a 5 out of 10. 1 being the lowest and 10 being the highest. Pt stated her needs are to get on the right meds. Pt stated her unhealthy behavior is avoidance. Pt stated she would work on not letting the words or actions of others effect her feelings.   Caswell Corwin 07/08/2013, 3:28 PM

## 2013-07-08 NOTE — [facility] Group Notes (Signed)
[facility] Group Notes:  (Clinical Social Work)  07/08/2013   3:00-4:00PM  Summary of Progress/Problems:   The main focus of today's process group was for the patient to identify something in their life that led to their hospitalization that they would like to change, then to discuss their motivation to change.  The Stages of Change were explained to the group, then each patient identified where they are in that process.  A scaling question was used with motivation interviewing to determine the patient's current motivation to change the identified behavior (1-10, low to high). The patient expressed that she isolates herself as self-protection, but she understands that it is undermines her mental health in the long run.  Her motivation to change is 10 out of 10, and she added that the last time she was hospitalized she was completely focused on going home, whereas now she only wants to work to get better.  Type of Therapy:  Process Group  Participation Level:  Active  Participation Quality:  Attentive  Affect:  Blunted and Depressed  Cognitive:  Oriented  Insight:  Engaged  Engagement in Therapy:  Engaged  Modes of Intervention:  Education, Motivational Interviewing   Ambrose Mantle, LCSW 07/08/2013, 4:49 PM

## 2013-07-08 NOTE — Progress Notes (Signed)
Patient ID: Jaclyn [name], female   DOB: [birth year], 57 y.o.   MRN: [id number] D: Patient rated anxiety as 8 and depression as 8 out of 0-10 scale. Pt mood/affect is depressed and flat. Pt stated she is surprised how much better she has felt since been at [facility]. Pt endorses on and off suicidal ideation but contracts for safety. Pt denies HI/AVH and pain. Pt attended evening wrap up group and was appropriate and engaged.  Pt denies any needs or concerns.  Cooperative with assessment. No acute distressed noted at this time.   A: Met with pt 1:1. Medications administered as prescribed. Writer encouraged pt to discuss feelings. Pt encouraged to come to staff with any question or concerns. 15 minutes checks for safety.  R: Patient remains safe. She is complaint with medications and denies any adverse reaction. Continue current POC.

## 2013-07-08 NOTE — Progress Notes (Signed)
[facility] MD Progress Note  07/08/2013 3:24 PM Jaclyn [name]  MRN:  [id number] Subjective:   Patient reports that her daughter was not able to find her records of medication from [facility] and patient has no memory of what they were. She admits to some memory impairment from a past overdose. Patient has heard voices in the recent past and continues to talk about "Flying into rages and cussing at people. My family does not want to be around me that much anymore." She becomes tearful when talking about her fluctuating moods and depression. Patient does not feel her prior medications were working and that she is at high risk for another suicide attempt.   Diagnosis:   Axis I: Major Depression, Recurrent severe Axis II: Deferred Axis III:  Past Medical History  Diagnosis Date  . Diabetes mellitus without complication   . Hypertension   . Arthritis   . Depression    Axis IV: other psychosocial or environmental problems, problems related to social environment, problems with access to health care services and problems with primary support group Axis V: 41-50 serious symptoms  ADL's:  Intact  Sleep: Fair  Appetite:  Fair  Suicidal Ideation:  Passive SI with plan to overdose Homicidal Ideation:  Denies AEB (as evidenced by):  Psychiatric Specialty Exam: Review of Systems  Constitutional: Negative.   HENT: Negative.   Eyes: Negative.   Respiratory: Negative.   Cardiovascular: Negative.   Gastrointestinal: Negative.   Musculoskeletal: Negative.   Skin: Negative.   Neurological: Negative.   Endo/Heme/Allergies: Negative.   Psychiatric/Behavioral: Positive for depression, suicidal ideas and memory loss. Negative for hallucinations and substance abuse. The patient is nervous/anxious and has insomnia.     Blood pressure 159/92, pulse 82, temperature 97.9 F (36.6 C), temperature source Oral, resp. rate 18, height 5' 4.57" (1.64 m), weight 47.401 kg (104 lb 8 oz).Body mass index is 17.62  kg/(m^2).  General Appearance: Casual and Fairly Groomed  Patent attorney::  Fair  Speech:  Clear and Coherent  Volume:  Normal  Mood:  Anxious and Depressed  Affect:  Flat  Thought Process:  Circumstantial  Orientation:  Full (Time, Place, and Person)  Thought Content:  Rumination  Suicidal Thoughts:  Yes.  with intent/plan  Homicidal Thoughts:  No  Memory:  Immediate;   Fair Recent;   Fair Remote;   Fair  Judgement:  Impaired  Insight:  Shallow  Psychomotor Activity:  Normal  Concentration:  Fair  Recall:  Fair  Akathisia:  No  Handed:  Right  AIMS (if indicated):     Assets:  Communication Skills Desire for Improvement Intimacy Leisure Time Resilience Social Support  Sleep:  Number of Hours: 5.25   Current Medications: Current Facility-Administered Medications  Medication Dose Route Frequency Provider Last Rate Last Dose  . acetaminophen (TYLENOL) tablet 650 mg  650 mg Oral Q6H PRN Kerry Hough, PA-C      . alum & mag hydroxide-simeth (MAALOX/MYLANTA) 200-200-20 MG/5ML suspension 30 mL  30 mL Oral Q4H PRN Kerry Hough, PA-C      . enalapril (VASOTEC) tablet 10 mg  10 mg Oral Daily Kerry Hough, PA-C   10 mg at 07/08/13 0753  . feeding supplement (GLUCERNA SHAKE) liquid 237 mL  237 mL Oral QHS Jeoffrey Massed, RD   237 mL at 07/07/13 2027  . magnesium hydroxide (MILK OF MAGNESIA) suspension 30 mL  30 mL Oral Daily PRN Kerry Hough, PA-C      .  meloxicam (MOBIC) tablet 15 mg  15 mg Oral Daily Kerry Hough, PA-C   15 mg at 07/08/13 0753  . metFORMIN (GLUCOPHAGE) tablet 500 mg  500 mg Oral BID WC Kerry Hough, PA-C   500 mg at 07/08/13 0753  . multivitamin with minerals tablet 1 tablet  1 tablet Oral Daily Jeoffrey Massed, RD   1 tablet at 07/07/13 1857  . traZODone (DESYREL) tablet 50 mg  50 mg Oral QHS,MR X 1 Kerry Hough, PA-C   50 mg at 07/08/13 9604    Lab Results:  Results for orders placed during the hospital encounter of 07/06/13 (from the past 48  hour(s))  GLUCOSE, CAPILLARY     Status: Abnormal   Collection Time    07/07/13  6:25 AM      Result Value Range   Glucose-Capillary 187 (*) 70 - 99 mg/dL  GLUCOSE, CAPILLARY     Status: Abnormal   Collection Time    07/07/13 11:37 AM      Result Value Range   Glucose-Capillary 234 (*) 70 - 99 mg/dL  GLUCOSE, CAPILLARY     Status: Abnormal   Collection Time    07/07/13  5:03 PM      Result Value Range   Glucose-Capillary 197 (*) 70 - 99 mg/dL  GLUCOSE, CAPILLARY     Status: Abnormal   Collection Time    07/07/13  9:17 PM      Result Value Range   Glucose-Capillary 201 (*) 70 - 99 mg/dL  GLUCOSE, CAPILLARY     Status: Abnormal   Collection Time    07/08/13  6:20 AM      Result Value Range   Glucose-Capillary 293 (*) 70 - 99 mg/dL  GLUCOSE, CAPILLARY     Status: Abnormal   Collection Time    07/08/13 11:59 AM      Result Value Range   Glucose-Capillary 202 (*) 70 - 99 mg/dL    Physical Findings: AIMS: Facial and Oral Movements Muscles of Facial Expression: None, normal Lips and Perioral Area: None, normal Jaw: None, normal Tongue: None, normal,Extremity Movements Upper (arms, wrists, hands, fingers): None, normal Lower (legs, knees, ankles, toes): None, normal, Trunk Movements Neck, shoulders, hips: None, normal, Overall Severity Severity of abnormal movements (highest score from questions above): None, normal Incapacitation due to abnormal movements: None, normal Patient's awareness of abnormal movements (rate only patient's report): No Awareness, Dental Status Current problems with teeth and/or dentures?: No Does patient usually wear dentures?: No  CIWA:    COWS:     Treatment Plan Summary: Daily contact with patient to assess and evaluate symptoms and progress in treatment Medication management  Plan: Continue crisis management and stabilization.  Medication management: Begin Zoloft 25 mg for depression. Start Risperdal 0.25 mg BID to help with rage and mood  instability.  Encouraged patient to attend groups and participate in group counseling sessions and activities.  Discharge plan in progress.  Address health issues: Vitals reviewed and stable.  Continue current treatment plan.   Medical Decision Making Problem Points:  Established problem, stable/improving (1) and Review of psycho-social stressors (1) Data Points:  Review of medication regiment & side effects (2)  I certify that inpatient services furnished can reasonably be expected to improve the patient's condition.   Dustie Brittle NP-C 07/08/2013, 3:24 PM

## 2013-07-08 NOTE — Progress Notes (Signed)
The focus of this group is to help patients establish daily goals to achieve during treatment and discuss how the patient can incorporate goal setting into their daily lives to aide in recovery.  The theme for today is healthy coping skills. Pt stated that her goal for today is to be more confident in herself by thinking or saying give yourself credit for things that you do. Pt was encouraged to have positive reinforcement.

## 2013-07-08 NOTE — Progress Notes (Signed)
Writer spoke with patient at medication window and she reports her day as being better. She reports that it started off pretty rough but as the day went on it got better. Patient reports that her appetite was not the best at breakfast and her appetite increased at lunch but did not want her dinner. Patient reports her daughter visited on today and she has been a positive support for her with everything she has gone through. Patient reports passive si and verbally contracts for safety. Patient denies hi/a/v hallucination. Support and encouragement offered, safety maintained on unit, will continue to monitor.

## 2013-07-08 NOTE — Progress Notes (Signed)
[facility] Group Notes:  (Nursing/MHT/Case Management/Adjunct)  Date:  07/08/2013  Time:  2000  Type of Therapy:  Psychoeducational Skills  Participation Level:  Active  Participation Quality:  Attentive  Affect:  Appropriate  Cognitive:  Appropriate  Insight:  Good  Engagement in Group:  Engaged  Modes of Intervention:  Education  Summary of Progress/Problems: The patient mentioned in group that she had an "ok" day. She states that the groups were positive and educational for her. In addition, she stated that she learned from the groups that she can stay away from people and be by herself is she chooses to do so. Her goal for tomorrow is to stay more positive.   Hazle Coca S 07/08/2013, 9:55 PM

## 2013-07-09 LAB — GLUCOSE, CAPILLARY
Glucose-Capillary: 185 mg/dL — ABNORMAL HIGH (ref 70–99)
Glucose-Capillary: 247 mg/dL — ABNORMAL HIGH (ref 70–99)

## 2013-07-09 NOTE — Progress Notes (Signed)
[facility] Group Notes:  (Nursing/MHT/Case Management/Adjunct)  Date:  07/09/2013  Time:  2000  Type of Therapy:  Psychoeducational Skills  Participation Level:  Active  Participation Quality:  Attentive  Affect:  Appropriate  Cognitive:  Appropriate  Insight:  Improving  Engagement in Group:  Improving  Modes of Intervention:  Education  Summary of Progress/Problems: The patient expressed to the group this evening that she had a better day. She feels that her condition is improving and that she is pleased to be on a new medication regimen. Her goal for tomorrow is to see if she is experiencing any medication side effects.   Hazle Coca S 07/09/2013, 11:51 PM

## 2013-07-09 NOTE — Progress Notes (Signed)
D Jaclyn [name] remains acutely sad, distant and flat. SHe states her deficit in hearing makes it difficult for her to communicate with people...that her appetite cont to be compromised...that the glucerna supplements are helping her. She says she has lost more than 50 pounds in the past 6 months, due to not eating correctly ( as a direct result of her depression, she says ) .   A Jaclyn [name] attends her groups as planned and is engaged in hte goup discussion. SHe completed her AM self inventory and on it she wrote she cont to have " off and on" SI, she rated her depression and hopelessness "10/8" and she stated her DC plan is to "  communicate better about things".   R Safety is in place and POC cont'd.

## 2013-07-09 NOTE — Progress Notes (Signed)
The focus of this group is to help patients establish daily goals to achieve during treatment and discuss how the patient can incorporate goal setting into their daily lives to aide in recovery.  The topic for this group was three things to be grateful for: daughter, being a good listener, being here at [facility].

## 2013-07-09 NOTE — Progress Notes (Signed)
Adult Psychoeducational Group Note  Date:  07/09/2013 Time:  1:08 PM  Group Topic/Focus:  Making Healthy Choices:   The focus of this group is to help patients identify negative/unhealthy choices they were using prior to admission and identify positive/healthier coping strategies to replace them upon discharge.  Participation Level:  Minimal  Participation Quality:  Appropriate  Affect:  Appropriate and Flat  Cognitive:  Appropriate  Insight: Appropriate  Engagement in Group:  Engaged and Supportive  Modes of Intervention:  Discussion, Role-play, Socialization and Support  Additional Comments:  Pt attended group.  Caswell Corwin 07/09/2013, 1:08 PM

## 2013-07-09 NOTE — Progress Notes (Signed)
Patient ID: Jaclyn [name], female   DOB: 07-26-1956, 57 y.o.   MRN: [id number] [facility] MD Progress Note  07/09/2013 3:24 PM Jaclyn [name]  MRN:  [id number] Subjective:   Patient states to writer "This is my best day so far. I'm still having trouble concentrating but I did sleep better." Rates her depression at nine and her anxiety at six. Patient finds that being around the people in groups is stressful but feels the medicine is starting to help some. Reports thoughts of suicide are present but decreasing.   Diagnosis:   Axis I: Major Depression, Recurrent severe Axis II: Deferred Axis III:  Past Medical History  Diagnosis Date  . Diabetes mellitus without complication   . Hypertension   . Arthritis   . Depression    Axis IV: other psychosocial or environmental problems, problems related to social environment, problems with access to health care services and problems with primary support group Axis V: 41-50 serious symptoms  ADL's:  Intact  Sleep: Fair  Appetite:  Fair  Suicidal Ideation:  Passive SI with plan to overdose Homicidal Ideation:  Denies AEB (as evidenced by):  Psychiatric Specialty Exam: Review of Systems  Constitutional: Negative.   HENT: Negative.   Eyes: Negative.   Respiratory: Negative.   Cardiovascular: Negative.   Gastrointestinal: Negative.   Musculoskeletal: Negative.   Skin: Negative.   Neurological: Negative.   Endo/Heme/Allergies: Negative.   Psychiatric/Behavioral: Positive for depression, suicidal ideas and memory loss. Negative for hallucinations and substance abuse. The patient is nervous/anxious and has insomnia.     Blood pressure 172/91, pulse 71, temperature 97.5 F (36.4 C), temperature source Oral, resp. rate 18, height 5' 4.57" (1.64 m), weight 47.401 kg (104 lb 8 oz).Body mass index is 17.62 kg/(m^2).  General Appearance: Casual and Fairly Groomed  Patent attorney::  Fair  Speech:  Clear and Coherent  Volume:  Normal  Mood:  Anxious and  Depressed  Affect:  Flat  Thought Process:  Circumstantial  Orientation:  Full (Time, Place, and Person)  Thought Content:  Rumination  Suicidal Thoughts:  Yes.  with intent/plan  Homicidal Thoughts:  No  Memory:  Immediate;   Fair Recent;   Fair Remote;   Fair  Judgement:  Impaired  Insight:  Shallow  Psychomotor Activity:  Normal  Concentration:  Fair  Recall:  Fair  Akathisia:  No  Handed:  Right  AIMS (if indicated):     Assets:  Communication Skills Desire for Improvement Intimacy Leisure Time Resilience Social Support  Sleep:  Number of Hours: 6   Current Medications: Current Facility-Administered Medications  Medication Dose Route Frequency Provider Last Rate Last Dose  . acetaminophen (TYLENOL) tablet 650 mg  650 mg Oral Q6H PRN Kerry Hough, PA-C      . alum & mag hydroxide-simeth (MAALOX/MYLANTA) 200-200-20 MG/5ML suspension 30 mL  30 mL Oral Q4H PRN Kerry Hough, PA-C      . enalapril (VASOTEC) tablet 10 mg  10 mg Oral Daily Kerry Hough, PA-C   10 mg at 07/09/13 0845  . feeding supplement (GLUCERNA SHAKE) liquid 237 mL  237 mL Oral QHS Jeoffrey Massed, RD   237 mL at 07/08/13 1951  . magnesium hydroxide (MILK OF MAGNESIA) suspension 30 mL  30 mL Oral Daily PRN Kerry Hough, PA-C      . meloxicam (MOBIC) tablet 15 mg  15 mg Oral Daily Kerry Hough, PA-C   15 mg at 07/09/13 0845  . metFORMIN (GLUCOPHAGE)  tablet 500 mg  500 mg Oral BID WC Kerry Hough, PA-C   500 mg at 07/09/13 0845  . multivitamin with minerals tablet 1 tablet  1 tablet Oral Daily Jeoffrey Massed, RD   1 tablet at 07/08/13 1803  . risperiDONE (RISPERDAL) tablet 0.25 mg  0.25 mg Oral BID Fransisca Kaufmann, NP   0.25 mg at 07/09/13 0845  . sertraline (ZOLOFT) tablet 25 mg  25 mg Oral Daily Fransisca Kaufmann, NP   25 mg at 07/09/13 0845  . traZODone (DESYREL) tablet 50 mg  50 mg Oral QHS,MR X 1 Kerry Hough, PA-C   50 mg at 07/08/13 2115    Lab Results:  Results for orders placed during the  hospital encounter of 07/06/13 (from the past 48 hour(s))  GLUCOSE, CAPILLARY     Status: Abnormal   Collection Time    07/07/13  5:03 PM      Result Value Range   Glucose-Capillary 197 (*) 70 - 99 mg/dL  GLUCOSE, CAPILLARY     Status: Abnormal   Collection Time    07/07/13  9:17 PM      Result Value Range   Glucose-Capillary 201 (*) 70 - 99 mg/dL  GLUCOSE, CAPILLARY     Status: Abnormal   Collection Time    07/08/13  6:20 AM      Result Value Range   Glucose-Capillary 293 (*) 70 - 99 mg/dL  GLUCOSE, CAPILLARY     Status: Abnormal   Collection Time    07/08/13 11:59 AM      Result Value Range   Glucose-Capillary 202 (*) 70 - 99 mg/dL  GLUCOSE, CAPILLARY     Status: Abnormal   Collection Time    07/08/13  5:04 PM      Result Value Range   Glucose-Capillary 198 (*) 70 - 99 mg/dL  GLUCOSE, CAPILLARY     Status: Abnormal   Collection Time    07/08/13  9:19 PM      Result Value Range   Glucose-Capillary 184 (*) 70 - 99 mg/dL   Comment 1 Notify RN    GLUCOSE, CAPILLARY     Status: Abnormal   Collection Time    07/09/13  6:10 AM      Result Value Range   Glucose-Capillary 177 (*) 70 - 99 mg/dL  GLUCOSE, CAPILLARY     Status: Abnormal   Collection Time    07/09/13 12:05 PM      Result Value Range   Glucose-Capillary 185 (*) 70 - 99 mg/dL    Physical Findings: AIMS: Facial and Oral Movements Muscles of Facial Expression: None, normal Lips and Perioral Area: None, normal Jaw: None, normal Tongue: None, normal,Extremity Movements Upper (arms, wrists, hands, fingers): None, normal Lower (legs, knees, ankles, toes): None, normal, Trunk Movements Neck, shoulders, hips: None, normal, Overall Severity Severity of abnormal movements (highest score from questions above): None, normal Incapacitation due to abnormal movements: None, normal Patient's awareness of abnormal movements (rate only patient's report): No Awareness, Dental Status Current problems with teeth and/or  dentures?: No Does patient usually wear dentures?: No  CIWA:    COWS:     Treatment Plan Summary: Daily contact with patient to assess and evaluate symptoms and progress in treatment Medication management  Plan: Continue crisis management and stabilization.  Medication management: Continue Zoloft and Risperdal as ordered.  Encouraged patient to attend groups and participate in group counseling sessions and activities.  Discharge plan in progress.  Address health  issues: Vitals reviewed and stable.  Continue current treatment plan.   Medical Decision Making Problem Points:  Established problem, stable/improving (1) and Review of psycho-social stressors (1) Data Points:  Review of medication regiment & side effects (2)  I certify that inpatient services furnished can reasonably be expected to improve the patient's condition.   Prospero Mahnke NP-C 07/09/2013, 3:24 PM

## 2013-07-09 NOTE — [facility] Group Notes (Signed)
[facility] Group Notes:  (Clinical Social Work)  07/09/2013   3:00-4:00PM  Summary of Progress/Problems:   The main focus of today's process group was to   identify the patient's current support system and decide on other supports that can be put in place.  The picture on workbook was used to discuss why additional supports are needed, and a hand-out was distributed with four definitions/levels of support, then used to talk about how patients have given and received all different kinds of support.  An emphasis was placed on using counselor, doctor, therapy groups, 12-step groups, and problem-specific support groups to expand supports.  The patient identified that she believes she needs to take some of her reliance upon her daughter over the last 2 years and spread out that need for support elsewhere so that she does not burn out her daughter.  Type of Therapy:  Process Group  Participation Level:  Active  Participation Quality:  Attentive and Sharing  Affect:  Blunted and Depressed  Cognitive:  Oriented  Insight:  Engaged  Engagement in Therapy:  Engaged  Modes of Intervention:  Education,  Support and [employer], LCSW 07/09/2013, 4:33 PM

## 2013-07-10 LAB — GLUCOSE, CAPILLARY
Glucose-Capillary: 190 mg/dL — ABNORMAL HIGH (ref 70–99)
Glucose-Capillary: 200 mg/dL — ABNORMAL HIGH (ref 70–99)

## 2013-07-10 MED ORDER — RISPERIDONE 0.5 MG PO TABS
0.5000 mg | ORAL_TABLET | Freq: Two times a day (BID) | ORAL | Status: DC
  Administered 2013-07-10 – 2013-07-12 (×5): 0.5 mg via ORAL
  Filled 2013-07-10: qty 10
  Filled 2013-07-10 (×7): qty 1
  Filled 2013-07-10: qty 10

## 2013-07-10 NOTE — ED Provider Notes (Addendum)
Medical screening examination/treatment/procedure(s) were performed by non-physician practitioner and as supervising physician I was immediately available for consultation/collaboration.   Gilda Crease, MD 07/10/13 Jerene Bears  Gilda Crease, MD 07/10/13 Jerene Bears

## 2013-07-10 NOTE — Progress Notes (Signed)
Adult Psychoeducational Group Note  Date:  07/10/2013 Time:  8:00PM Group Topic/Focus:  Wrap-Up Group:   The focus of this group is to help patients review their daily goal of treatment and discuss progress on daily workbooks.  Participation Level:  Active  Participation Quality:  Appropriate and Attentive  Affect:  Appropriate  Cognitive:  Alert and Appropriate  Insight: Appropriate  Engagement in Group:  Engaged  Modes of Intervention:  Discussion  Additional Comments:  Pt. Was attentive and appropriate during tonight's group discussion. Pt. Stated that she had a good day. Pt shared how was able to have positive communication and interaction with doctors and social worker today. Pt shared how social worker is helping her with therapy once discharged.   Bing Plume D 07/10/2013, 9:11 PM

## 2013-07-10 NOTE — [facility] Group Notes (Signed)
[facility] LCSW Aftercare Discharge Planning Group Note   07/10/2013 8:45 AM  Participation Quality:  Alert and Appropriate   Mood/Affect:  Appropriate, Flat and Depressed  Depression Rating:  9  Anxiety Rating:  2  Thoughts of Suicide:  Pt denies SI/HI  Will you contract for safety?   Yes  Current AVH:  Pt denies  Plan for Discharge/Comments:  Pt attended discharge planning group and actively participated in group.  CSW provided pt with today's workbook.  Pt states that she is improving but still feeling down and depressed.  Pt states that she will return home in [place] with daughter.  Pt was referred to [facility] but now wants an outpatient provider in [place].  CSW will assess for appropriate referrals. No further needs voiced by pt at this time.    Transportation Means: Pt reports access to transportation  Supports: Pt states that her daughter is her main support  Reyes Ivan, LCSWA 07/10/2013 10:32 AM

## 2013-07-10 NOTE — Progress Notes (Signed)
Grief and Loss Group  Pts explored their experiences and emotions with grief and loss in this counseling group.  Pt was attentive for the group, but left in the last 15 mintues. At times she appeared distracted or nervous in the group, but seemed to be listening the entire time she was present even though she did not respond verbally.  Lenoard Aden Counseling Intern Haroldine Laws

## 2013-07-10 NOTE — Progress Notes (Signed)
D - Pt active on the unit, interacting with peers a little. Pt continues to c/o depression but feels better because the social worker is helping her with her medications and her appointments when she discharges. Pt denies SI/HI or any auditory or visual hallucinations. Pt attended group tonight and was attentive.  A - Pt offered encouragement and support through therapeutic conversation. Pt encouraged to speak with staff about any concerns or questions. Medications given as ordered.  R - Pt safety maintained wit Q 15 minute checks. Pt remains safe on the unit.

## 2013-07-10 NOTE — Progress Notes (Signed)
Patient ID: Jaclyn [name], female   DOB: 21-Apr-1956, 57 y.o.   MRN: [id number] [facility] MD Progress Note  07/10/2013 1:56 PM Jaclyn [name]  MRN:  [id number] Subjective:  Patient notes extreme anxiety and rates her depression as 8/10 and her anxiety as 3/10.  Objective: patient is oriented x 3 but seems to wander off topic. Notes a number of roadblocks to her recovery and refuses to consider follow up at [facility] in [place]. She states that is why she is in the hospital "because they didn't listen to me about my side effects on medication." She does not report any side effects to her medication at this time.  Diagnosis:   Axis I: Major Depression, Recurrent severe Axis II: Deferred Axis III:  Past Medical History  Diagnosis Date  . Diabetes mellitus without complication   . Hypertension   . Arthritis   . Depression    Axis IV: other psychosocial or environmental problems, problems related to social environment, problems with access to health care services and problems with primary support group Axis V: 41-50 serious symptoms  ADL's:  Intact  Sleep: Fair  Appetite:  Fair  Suicidal Ideation:  Passive SI with plan to overdose Homicidal Ideation:  Denies AEB (as evidenced by):  Psychiatric Specialty Exam: Review of Systems  Constitutional: Negative.  Negative for fever, chills, weight loss, malaise/fatigue and diaphoresis.  HENT: Negative.  Negative for congestion and sore throat.   Eyes: Negative.  Negative for blurred vision, double vision and photophobia.  Respiratory: Negative.  Negative for cough, shortness of breath and wheezing.   Cardiovascular: Negative.  Negative for chest pain, palpitations and PND.  Gastrointestinal: Negative.  Negative for heartburn, nausea, vomiting, abdominal pain, diarrhea and constipation.  Musculoskeletal: Negative.  Negative for myalgias, joint pain and falls.  Skin: Negative.   Neurological: Negative.  Negative for dizziness, tingling, tremors, sensory  change, speech change, focal weakness, seizures, loss of consciousness, weakness and headaches.  Endo/Heme/Allergies: Negative.  Negative for polydipsia. Does not bruise/bleed easily.  Psychiatric/Behavioral: Positive for depression, suicidal ideas and memory loss. Negative for hallucinations and substance abuse. The patient is nervous/anxious and has insomnia.     Blood pressure 158/96, pulse 69, temperature 97.5 F (36.4 C), temperature source Oral, resp. rate 18, height 5' 4.57" (1.64 m), weight 47.401 kg (104 lb 8 oz).Body mass index is 17.62 kg/(m^2).  General Appearance: Casual and Fairly Groomed  Patent attorney::  Fair  Speech:  Clear and Coherent  Volume:  Normal  Mood:  Anxious and Depressed  Affect:  Flat  Thought Process:  Circumstantial  Orientation:  Full (Time, Place, and Person)  Thought Content:  Rumination  Suicidal Thoughts:  Yes.  with intent/plan  Homicidal Thoughts:  No  Memory:  Immediate;   Fair Recent;   Fair Remote;   Fair  Judgement:  Impaired  Insight:  Shallow  Psychomotor Activity:  Normal  Concentration:  Fair  Recall:  Fair  Akathisia:  No  Handed:  Right  AIMS (if indicated):     Assets:  Communication Skills Desire for Improvement Intimacy Leisure Time Resilience Social Support  Sleep:  Number of Hours: 5   Current Medications: Current Facility-Administered Medications  Medication Dose Route Frequency Provider Last Rate Last Dose  . acetaminophen (TYLENOL) tablet 650 mg  650 mg Oral Q6H PRN Kerry Hough, PA-C      . alum & mag hydroxide-simeth (MAALOX/MYLANTA) 200-200-20 MG/5ML suspension 30 mL  30 mL Oral Q4H PRN Kerry Hough, PA-C      .  enalapril (VASOTEC) tablet 10 mg  10 mg Oral Daily Kerry Hough, PA-C   10 mg at 07/10/13 0757  . feeding supplement (GLUCERNA SHAKE) liquid 237 mL  237 mL Oral QHS Jeoffrey Massed, RD   237 mL at 07/09/13 2057  . magnesium hydroxide (MILK OF MAGNESIA) suspension 30 mL  30 mL Oral Daily PRN Kerry Hough, PA-C      . meloxicam (MOBIC) tablet 15 mg  15 mg Oral Daily Kerry Hough, PA-C   15 mg at 07/10/13 0758  . metFORMIN (GLUCOPHAGE) tablet 500 mg  500 mg Oral BID WC Kerry Hough, PA-C   500 mg at 07/10/13 0759  . multivitamin with minerals tablet 1 tablet  1 tablet Oral Daily Jeoffrey Massed, RD   1 tablet at 07/10/13 0759  . risperiDONE (RISPERDAL) tablet 0.25 mg  0.25 mg Oral BID Fransisca Kaufmann, NP   0.25 mg at 07/10/13 0800  . sertraline (ZOLOFT) tablet 25 mg  25 mg Oral Daily Fransisca Kaufmann, NP   25 mg at 07/10/13 0800  . traZODone (DESYREL) tablet 50 mg  50 mg Oral QHS,MR X 1 Kerry Hough, PA-C   50 mg at 07/08/13 2115    Lab Results:  Results for orders placed during the hospital encounter of 07/06/13 (from the past 48 hour(s))  GLUCOSE, CAPILLARY     Status: Abnormal   Collection Time    07/08/13  5:04 PM      Result Value Range   Glucose-Capillary 198 (*) 70 - 99 mg/dL  GLUCOSE, CAPILLARY     Status: Abnormal   Collection Time    07/08/13  9:19 PM      Result Value Range   Glucose-Capillary 184 (*) 70 - 99 mg/dL   Comment 1 Notify RN    GLUCOSE, CAPILLARY     Status: Abnormal   Collection Time    07/09/13  6:10 AM      Result Value Range   Glucose-Capillary 177 (*) 70 - 99 mg/dL  GLUCOSE, CAPILLARY     Status: Abnormal   Collection Time    07/09/13 12:05 PM      Result Value Range   Glucose-Capillary 185 (*) 70 - 99 mg/dL  GLUCOSE, CAPILLARY     Status: Abnormal   Collection Time    07/09/13  4:56 PM      Result Value Range   Glucose-Capillary 206 (*) 70 - 99 mg/dL   Comment 1 Notify RN    GLUCOSE, CAPILLARY     Status: Abnormal   Collection Time    07/09/13  9:19 PM      Result Value Range   Glucose-Capillary 247 (*) 70 - 99 mg/dL   Comment 1 Notify RN    GLUCOSE, CAPILLARY     Status: Abnormal   Collection Time    07/10/13  6:12 AM      Result Value Range   Glucose-Capillary 190 (*) 70 - 99 mg/dL   Comment 1 Notify RN      Physical  Findings: AIMS: Facial and Oral Movements Muscles of Facial Expression: None, normal Lips and Perioral Area: None, normal Jaw: None, normal Tongue: None, normal,Extremity Movements Upper (arms, wrists, hands, fingers): None, normal Lower (legs, knees, ankles, toes): None, normal, Trunk Movements Neck, shoulders, hips: None, normal, Overall Severity Severity of abnormal movements (highest score from questions above): None, normal Incapacitation due to abnormal movements: None, normal Patient's awareness of abnormal movements (rate only  patient's report): No Awareness, Dental Status Current problems with teeth and/or dentures?: No Does patient usually wear dentures?: No  CIWA:  CIWA-Ar Total: 1 COWS:  COWS Total Score: 1  Treatment Plan Summary: Daily contact with patient to assess and evaluate symptoms and progress in treatment Medication management  Plan: Continue crisis management and stabilization.  Medication management: Continue Zoloft  1. Will increase the Risperdal to 0.5 mg po BID for anxiety. 2. CM will discern where she plans to follow up for therapy. 3. Will continue this admission due to her poor sleep, confusion, and resistant to follow up on discharge which increases her risk for readmission. Medical Decision Making Problem Points:  Established problem, stable/improving (1) and Review of psycho-social stressors (1) Data Points:  Review of medication regiment & side effects (2)  I certify that inpatient services furnished can reasonably be expected to improve the patient's condition.  Rona Ravens. Raciel Caffrey RPAC 4:46 PM 07/10/2013

## 2013-07-10 NOTE — Progress Notes (Signed)
D:  Patient's self inventory sheet, patient sleeps fair, improving appetite, low energy level, poor attention span.  Rated depression #10, hopelessness #5.  Denied withdrawals.  Denied SI.  Has experienced lightheadedness, headache and right ear ringing.  After discharge, plans to make regular MD and dental appointments. A:  Medications administered per MD orders.  Emotional support and encouragement given patient. R:  Denied SI and HI.   Denied A/V hallucinations.  Denied pain.  Will continue to monitor patient for safety with 15 minute checks.  Safety maintained.

## 2013-07-10 NOTE — Progress Notes (Signed)
Psychoeducational Group Note  Date:  07/10/2013 Time:  1100  Group Topic/Focus:  Self Care:   The focus of this group is to help patients understand the importance of self-care in order to improve or restore emotional, physical, spiritual, interpersonal, and financial health.  Participation Level: Did Not Attend  Participation Quality:  Not Applicable  Affect:  Not Applicable  Cognitive:  Not Applicable  Insight:  Not Applicable  Engagement in Group: Not Applicable  Additional Comments:  Patient did not attend group, remained in bed.  Karleen Hampshire Brittini 07/10/2013, 1:50 PM

## 2013-07-10 NOTE — [facility] Group Notes (Signed)
[facility] LCSW Group Therapy  07/10/2013  1:15 PM   Type of Therapy:  Group Therapy  Participation Level:  Active  Participation Quality:  Appropriate and Attentive  Affect:  Appropriate, Flat and Depressed  Cognitive:  Alert and Appropriate  Insight:  Developing/Improving and Engaged  Engagement in Therapy:  Developing/Improving and Engaged  Modes of Intervention:  Clarification, Confrontation, Discussion, Education, Exploration, Limit-setting, Orientation, Problem-solving, Rapport Building, Dance movement psychotherapist, Socialization and Support  Summary of Progress/Problems: Pt identified obstacles faced currently and processed barriers involved in overcoming these obstacles. Pt identified steps necessary for overcoming these obstacles and explored motivation (internal and external) for facing these difficulties head on. Pt further identified one area of concern in their lives and chose a goal to focus on for today.  Pt shared that she has identified one of her triggers to be holidays.  Pt explained that she recognizes around holidays she becomes depressed.  Pt states that she can change her routine by going on vacation instead of staying home for the holidays. Pt states that she overcame her self destructive lifestyle in the past and did so by having her daughter as motivation to do better.  Pt actively participated and was engaged in group discussion.    Reyes Ivan, LCSWA 07/10/2013 1:56 PM

## 2013-07-10 NOTE — Tx Team (Signed)
Interdisciplinary Treatment Plan Update (Adult)  Date: 07/10/2013  Time Reviewed:  9:45 AM  Progress in Treatment: Attending groups: Yes Participating in groups:  Yes Taking medication as prescribed:  Yes Tolerating medication:  Yes Family/Significant othe contact made: CSW assessing  Patient understands diagnosis:  Yes Discussing patient identified problems/goals with staff:  Yes Medical problems stabilized or resolved:  Yes Denies suicidal/homicidal ideation: Yes Issues/concerns per patient self-inventory:  Yes Other:  New problem(s) identified: N/A  Discharge Plan or Barriers: CSW assessing for appropriate referrals.  Reason for Continuation of Hospitalization: Anxiety Depression Medication Stabilization  Comments: N/A  Estimated length of stay: 2-3 days  For review of initial/current patient goals, please see plan of care.  Attendees: Patient:     Family:     Physician:   07/10/2013 10:35 AM   Nursing:   Quintella Reichert, RN 07/10/2013 10:35 AM   Clinical Social Worker:  Reyes Ivan, LCSWA 07/10/2013 10:35 AM   Other: Celso Amy, PA 07/10/2013 10:35 AM   Other:  Frankey Shown, MA care coordination 07/10/2013 10:35 AM   Other:  Juline Patch, LCSW 07/10/2013 10:35 AM   Other:  Verne Spurr, PA 07/10/2013 10:35 AM  Other: Neill Loft, RN 07/10/2013 10:35 AM  Other:    Other:    Other:    Other:    Other:     Scribe for Treatment Team:   Carmina Miller, 07/10/2013 10:35 AM

## 2013-07-10 NOTE — Progress Notes (Signed)
Recreation Therapy Notes  Date: 07.14.2014  Time: 3:00pm Location: [address] Dayroom     Group Topic/Focus: Self Expression, Coping Skills  Participation Level: Active  Participation Quality: Appropriate and Attentive  Affect: Flat  Cognitive: Appropriate   Additional Comments: Activity: Mini Wellness Toolbox; Explanation: Patient was given a small plain white box that functions like a matchbox. Patient was asked to represent themselves on the outside of the box and use words or pictures to represent the things they need to keep themselves well and place them on the inside of the box.   Patient actively participated in activity. Patient chose not to share how she represented herself or the items on the inside of her box.   Marykay Lex Makesha Belitz, LRT/CTRS  Pamla Pangle L 07/10/2013 4:03 PM

## 2013-07-10 NOTE — Progress Notes (Signed)
Writer spoke with patient after she had attended group and she reports that she feels that each day she is improving. Patient endorses passive si and verbally contracts for safety, denies hi/a/v hallucinations. Patient has been observed earlier by writer up in the dayroom interacting appropriately with peers. Writer informed patient of medications scheduled. Support and encouragement offered, safety maintained on unit, will continue to monitor.

## 2013-07-11 LAB — GLUCOSE, CAPILLARY: Glucose-Capillary: 224 mg/dL — ABNORMAL HIGH (ref 70–99)

## 2013-07-11 MED ORDER — LORATADINE 10 MG PO TABS
10.0000 mg | ORAL_TABLET | Freq: Every day | ORAL | Status: DC
  Administered 2013-07-11: 10 mg via ORAL
  Filled 2013-07-11 (×4): qty 1

## 2013-07-11 MED ORDER — SERTRALINE HCL 50 MG PO TABS
50.0000 mg | ORAL_TABLET | Freq: Every day | ORAL | Status: DC
  Administered 2013-07-12: 50 mg via ORAL
  Filled 2013-07-11: qty 1
  Filled 2013-07-11: qty 5
  Filled 2013-07-11: qty 1

## 2013-07-11 MED ORDER — PSEUDOEPHEDRINE HCL 30 MG PO TABS
30.0000 mg | ORAL_TABLET | Freq: Four times a day (QID) | ORAL | Status: DC | PRN
  Administered 2013-07-12 (×3): 30 mg via ORAL
  Filled 2013-07-11 (×3): qty 1

## 2013-07-11 NOTE — Progress Notes (Signed)
Patient ID: Jaclyn [name], female   DOB: Oct 19, 1956, 57 y.o.   MRN: [id number]  [facility] MD Progress Note  07/11/2013 1:46 PM Luzelena Heeg  MRN:  [id number] Subjective:   Patient attending groups on the unit. She rates her depression at eight and states "I don't think the medications have had time to help me yet." The patient is unsure why she is still feeling so depressed. Patient states "I try to be positive but something is still keeping me depressed." She plans to stay with her daughter after being discharged.   Diagnosis:   Axis I: Major Depression, Recurrent severe Axis II: Deferred Axis III:  Past Medical History  Diagnosis Date  . Diabetes mellitus without complication   . Hypertension   . Arthritis   . Depression    Axis IV: other psychosocial or environmental problems, problems related to social environment, problems with access to health care services and problems with primary support group Axis V: 41-50 serious symptoms  ADL's:  Intact  Sleep: Fair  Appetite:  Fair  Suicidal Ideation:  Passive SI with plan to overdose Homicidal Ideation:  Denies AEB (as evidenced by):  Psychiatric Specialty Exam: Review of Systems  Constitutional: Negative.  Negative for fever, chills, weight loss, malaise/fatigue and diaphoresis.  HENT: Positive for congestion and sore throat.   Eyes: Negative.  Negative for blurred vision, double vision and photophobia.  Respiratory: Negative.  Negative for cough, shortness of breath and wheezing.   Cardiovascular: Negative.  Negative for chest pain, palpitations and PND.  Gastrointestinal: Negative.  Negative for heartburn, nausea, vomiting, abdominal pain, diarrhea and constipation.  Musculoskeletal: Negative.  Negative for myalgias, joint pain and falls.  Skin: Negative.   Neurological: Negative.  Negative for dizziness, tingling, tremors, sensory change, speech change, focal weakness, seizures, loss of consciousness, weakness and headaches.   Endo/Heme/Allergies: Negative.  Negative for polydipsia. Does not bruise/bleed easily.  Psychiatric/Behavioral: Positive for depression, suicidal ideas and memory loss. Negative for hallucinations and substance abuse. The patient is nervous/anxious and has insomnia.     Blood pressure 150/81, pulse 76, temperature 98.2 F (36.8 C), temperature source Oral, resp. rate 16, height 5' 4.57" (1.64 m), weight 47.401 kg (104 lb 8 oz).Body mass index is 17.62 kg/(m^2).  General Appearance: Casual and Fairly Groomed  Patent attorney::  Fair  Speech:  Clear and Coherent  Volume:  Normal  Mood:  Anxious and Depressed  Affect:  Flat  Thought Process:  Circumstantial  Orientation:  Full (Time, Place, and Person)  Thought Content:  Rumination  Suicidal Thoughts:  Yes.  with intent/plan  Homicidal Thoughts:  No  Memory:  Immediate;   Fair Recent;   Fair Remote;   Fair  Judgement:  Impaired  Insight:  Shallow  Psychomotor Activity:  Normal  Concentration:  Fair  Recall:  Fair  Akathisia:  No  Handed:  Right  AIMS (if indicated):     Assets:  Communication Skills Desire for Improvement Intimacy Leisure Time Resilience Social Support  Sleep:  Number of Hours: 4.25   Current Medications: Current Facility-Administered Medications  Medication Dose Route Frequency Provider Last Rate Last Dose  . acetaminophen (TYLENOL) tablet 650 mg  650 mg Oral Q6H PRN Kerry Hough, PA-C   650 mg at 07/11/13 1107  . alum & mag hydroxide-simeth (MAALOX/MYLANTA) 200-200-20 MG/5ML suspension 30 mL  30 mL Oral Q4H PRN Kerry Hough, PA-C      . enalapril (VASOTEC) tablet 10 mg  10 mg Oral Daily Karleen Hampshire  E Simon, PA-C   10 mg at 07/11/13 0738  . feeding supplement (GLUCERNA SHAKE) liquid 237 mL  237 mL Oral QHS Jeoffrey Massed, RD   237 mL at 07/10/13 2018  . loratadine (CLARITIN) tablet 10 mg  10 mg Oral Daily Fransisca Kaufmann, NP      . magnesium hydroxide (MILK OF MAGNESIA) suspension 30 mL  30 mL Oral Daily PRN  Kerry Hough, PA-C      . meloxicam (MOBIC) tablet 15 mg  15 mg Oral Daily Kerry Hough, PA-C   15 mg at 07/11/13 0735  . metFORMIN (GLUCOPHAGE) tablet 500 mg  500 mg Oral BID WC Kerry Hough, PA-C   500 mg at 07/11/13 0736  . multivitamin with minerals tablet 1 tablet  1 tablet Oral Daily Jeoffrey Massed, RD   1 tablet at 07/11/13 0736  . pseudoephedrine (SUDAFED) tablet 30 mg  30 mg Oral Q6H PRN Fransisca Kaufmann, NP      . risperiDONE (RISPERDAL) tablet 0.5 mg  0.5 mg Oral BID Verne Spurr, PA-C   0.5 mg at 07/11/13 0737  . [START ON 07/12/2013] sertraline (ZOLOFT) tablet 50 mg  50 mg Oral Daily Fransisca Kaufmann, NP      . traZODone (DESYREL) tablet 50 mg  50 mg Oral QHS,MR X 1 Kerry Hough, PA-C   50 mg at 07/10/13 2117    Lab Results:  Results for orders placed during the hospital encounter of 07/06/13 (from the past 48 hour(s))  GLUCOSE, CAPILLARY     Status: Abnormal   Collection Time    07/09/13  4:56 PM      Result Value Range   Glucose-Capillary 206 (*) 70 - 99 mg/dL   Comment 1 Notify RN    GLUCOSE, CAPILLARY     Status: Abnormal   Collection Time    07/09/13  9:19 PM      Result Value Range   Glucose-Capillary 247 (*) 70 - 99 mg/dL   Comment 1 Notify RN    GLUCOSE, CAPILLARY     Status: Abnormal   Collection Time    07/10/13  6:12 AM      Result Value Range   Glucose-Capillary 190 (*) 70 - 99 mg/dL   Comment 1 Notify RN    GLUCOSE, CAPILLARY     Status: Abnormal   Collection Time    07/10/13  5:02 PM      Result Value Range   Glucose-Capillary 200 (*) 70 - 99 mg/dL    Physical Findings: AIMS: Facial and Oral Movements Muscles of Facial Expression: None, normal Lips and Perioral Area: None, normal Jaw: None, normal Tongue: None, normal,Extremity Movements Upper (arms, wrists, hands, fingers): None, normal Lower (legs, knees, ankles, toes): None, normal, Trunk Movements Neck, shoulders, hips: None, normal, Overall Severity Severity of abnormal movements  (highest score from questions above): None, normal Incapacitation due to abnormal movements: None, normal Patient's awareness of abnormal movements (rate only patient's report): No Awareness, Dental Status Current problems with teeth and/or dentures?: No Does patient usually wear dentures?: No  CIWA:  CIWA-Ar Total: 0 COWS:  COWS Total Score: 0  Treatment Plan Summary: Daily contact with patient to assess and evaluate symptoms and progress in treatment Medication management  Plan:  Continue crisis management and stabilization.  Medication management: Increase the Zoloft to 50 mg daily for continued depressive symptoms. Continue Risperdal 0.5 mg bid to improve anxiety and mood stability.  Encouraged patient to attend groups and  participate in group counseling sessions and activities.  Discharge plan in progress.  Address health issues: Vitals reviewed and stable. Order daily claritin and sudafed prn for complaints of sinus pressure and congestion.  Continue current treatment plan.    Problem Points:  Established problem, stable/improving (1) and Review of psycho-social stressors (1) Data Points:  Review of medication regiment & side effects (2)  I certify that inpatient services furnished can reasonably be expected to improve the patient's condition.  Fransisca Kaufmann NP-C 1:46 PM 07/11/2013

## 2013-07-11 NOTE — [facility] Group Notes (Signed)
[facility] LCSW Aftercare Discharge Planning Group Note   07/11/2013 8:45 AM  Participation Quality:  Alert and Appropriate   Mood/Affect:  Appropriate, Flat and Depressed  Depression Rating:  8  Anxiety Rating:  5  Thoughts of Suicide:  Pt denies SI/HI  Will you contract for safety?   Yes  Current AVH:  Pt denies  Plan for Discharge/Comments:  Pt attended discharge planning group and actively participated in group.  CSW provided pt with today's workbook.  Pt states that she feels better today and is close to ready for d/c.  Pt states that she will return home in [place] with daughter.   Pt has follow up scheduled at [facility] for medication management and therapy.  No further needs voiced by pt at this time.    Transportation Means: Pt reports daughter will pick her up  Supports: Pt states that her daughter is her main support  Reyes Ivan, LCSWA 07/11/2013 10:36 AM

## 2013-07-11 NOTE — Progress Notes (Addendum)
Patient ID: Jaclyn [name], female   DOB: 1956/12/24, 57 y.o.   MRN: [id number]  D: Pt denies SI/HI/AVH. Pt is pleasant and cooperative. Pt stated she had a bad day, but it was getting better. Pt crying upon approach, and pt explained the reason for her feeling bad. Pt stated she was upset over what someone else said earlier. Pt stated she was applying coping skills learned in group, so she was letting it go. Pt stated she still felt bad, but was handling the situation better than she would have in the past.  A: Pt was offered support and encouragement. Pt was given scheduled medications. Pt was encourage to attend groups. Q 15 minute checks were done for safety.   R:Pt did not attend group. Pt is taking medication. Pt has no complaints at this time.Pt receptive to treatment and safety maintained on unit.

## 2013-07-11 NOTE — [facility] Group Notes (Signed)
[facility] LCSW Group Therapy      Feelings About Diagnosis 1:15 - 2:30 PM         07/11/2013  1:13 PM    Type of Therapy:  Group Therapy  Participation Level:  Active  Participation Quality:  Appropriate  Affect:  Appropriate  Cognitive:  Alert and Appropriate  Insight:  Developing/Improving and Engaged  Engagement in Therapy:  Developing/Improving and Engaged  Modes of Intervention:  Discussion, Education, Exploration, Problem-Solving, Rapport Building, Support  Summary of Progress/Problems:  Patient actively participated in group. Patient discussed past and present diagnosis and the effects it has had on  life.  Patient talked about family and society being judgmental and the stigma associated with having a mental health diagnosis.  She shared she has not to worry about how others feel about about her illness.  Wynn Banker 07/11/2013  1:13 PM

## 2013-07-11 NOTE — Progress Notes (Addendum)
D:  Patient's self inventory sheet, patient needs sleep medication, has improved appetite, low energy level, poor attention span.  Rated depression #8, hopelessness #3, anxiety #3.  Denied withdrawals.  Denied SI.  Has felt lightheaded, blurred vision, and ringing in right ear in past 24 hours.  After discharge, "plan to let the stuff go that raises my anxiety and blood pressure, don't take personally and walk away.  I would like to have a prescription for the sleep aid I have taken here to be used as needed when I go home.  Would like a list of medical doctors psychiatrist dentist  in [place] that take [facility]."  Does have discharge plans.  No problems taking meds after discharge.  A:  Medications administered per MD orders.  Emotional support and encouragement given patient. R:  Denied SI and HI.  Denied A/V hallucinations.  Denied pain.  Will continue to monitor for safety with 15 minute checks.  Safety maintained. Patient is looking forward to discharge today.  Patient stated she does have some head congestion but declined any pain medication.

## 2013-07-11 NOTE — Progress Notes (Signed)
Adult Psychoeducational Group Note  Date:  07/11/2013 Time: 11:15am Group Topic/Focus:  Recovery Goals:   The focus of this group is to identify appropriate goals for recovery and establish a plan to achieve them.  Participation Level:  Active  Participation Quality:  Appropriate and Attentive  Affect:  Appropriate  Cognitive:  Alert and Appropriate  Insight: Appropriate  Engagement in Group:  Engaged  Modes of Intervention:  Discussion and Education  Additional Comments:  Pt attended and participated in group. When ask what she could do to improve her recovery process pt stated" try and do skills that I have learned,not to isolate myself and to speak up for myself."  Pryor Curia 07/11/2013, 3:10 PM

## 2013-07-11 NOTE — Progress Notes (Signed)
Recreation Therapy Notes  Date: 07.15.2014 Time: 2:45pm Location: [address] Dayroom      Group Topic/Focus: Musician (AAA/T)  Session: Animal Assisted Activities (AAA)  Dog Team: Lyman & handler  Affect: Euthymic  Cognitive: Appropraite  Participation Level: Active  Additional Comments: Patient interacted appropriately with peer, dog team, LRT and MHT.   Marykay Lex Dontrez Pettis, LRT/CTRS  Jalayah Gutridge L 07/11/2013 5:02 PM

## 2013-07-11 NOTE — Progress Notes (Signed)
Adult Psychoeducational Group Note  Date:  07/11/2013 Time:  11:56 PM  Group Topic/Focus:  Wrap-Up Group:   The focus of this group is to help patients review their daily goal of treatment and discuss progress on daily workbooks.  Participation Level:  Did Not Attend  Participation Quality:    Affect:    Cognitive:    Insight:   Engagement in Group:    Modes of Intervention:    Additional Comments:    Humberto Seals Monique 07/11/2013, 11:56 PM

## 2013-07-12 DIAGNOSIS — F3130 Bipolar disorder, current episode depressed, mild or moderate severity, unspecified: Principal | ICD-10-CM

## 2013-07-12 LAB — GLUCOSE, CAPILLARY: Glucose-Capillary: 179 mg/dL — ABNORMAL HIGH (ref 70–99)

## 2013-07-12 MED ORDER — MELOXICAM 15 MG PO TABS: 15.0000 mg | ORAL_TABLET | Freq: Every day | ORAL | Status: DC

## 2013-07-12 MED ORDER — METFORMIN HCL 500 MG PO TABS: 500.0000 mg | ORAL_TABLET | Freq: Two times a day (BID) | ORAL | Status: DC

## 2013-07-12 MED ORDER — RISPERIDONE 0.5 MG PO TABS: 0.5000 mg | ORAL_TABLET | Freq: Two times a day (BID) | ORAL | Status: DC

## 2013-07-12 MED ORDER — SERTRALINE HCL 50 MG PO TABS: 50.0000 mg | ORAL_TABLET | Freq: Every day | ORAL | Status: DC

## 2013-07-12 MED ORDER — TRAZODONE HCL 50 MG PO TABS: 50.0000 mg | ORAL_TABLET | Freq: Every evening | ORAL | Status: DC | PRN

## 2013-07-12 MED ORDER — ENALAPRIL MALEATE 10 MG PO TABS: 10.0000 mg | ORAL_TABLET | Freq: Every day | ORAL | Status: DC

## 2013-07-12 NOTE — Tx Team (Signed)
Interdisciplinary Treatment Plan Update (Adult)  Date: 07/12/2013  Time Reviewed:  9:45 AM  Progress in Treatment: Attending groups: Yes Participating in groups:  Yes Taking medication as prescribed:  Yes Tolerating medication:  Yes Family/Significant othe contact made: Yes Patient understands diagnosis:  Yes Discussing patient identified problems/goals with staff:  Yes Medical problems stabilized or resolved:  Yes Denies suicidal/homicidal ideation: Yes Issues/concerns per patient self-inventory:  Yes Other:  New problem(s) identified: N/A  Discharge Plan or Barriers: Pt has follow up scheduled at [facility] for medication management and therapy.    Reason for Continuation of Hospitalization: Stable to d/c  Comments: N/A  Estimated length of stay: D/C today  For review of initial/current patient goals, please see plan of care.  Attendees: Patient:     Family:     Physician:   07/12/2013 11:00 AM   Nursing:   Seabron Spates, RN 07/12/2013 11:00 AM   Clinical Social Worker:  Reyes Ivan, LCSWA 07/12/2013 11:00 AM   Other: Fransisca Kaufmann, NP 07/12/2013 11:00 AM   Other:  Frankey Shown, MA care coordination 07/12/2013 11:00 AM   Other:  Juline Patch, LCSW 07/12/2013 11:00 AM   Other:  Waynetta Sandy, RN .07/12/2013 11:00 AM  Other:    Other:    Other:    Other:    Other:    Other:     Scribe for Treatment Team:   Carmina Miller, 07/12/2013 11:00 AM

## 2013-07-12 NOTE — Progress Notes (Signed)
[facility] Adult Case Management Discharge Plan :  Will you be returning to the same living situation after discharge: Yes,  returning home At discharge, do you have transportation home?:Yes,  daughter will pick pt up Do you have the ability to pay for your medications:Yes,  access to meds  Release of information consent forms completed and in the chart;  Patient's signature needed at discharge.  Patient to Follow up at: Follow-up Information   Follow up with [facility] On 07/19/2013. (Appointment scheduled at 9:00 am for intake appointment, for medication management and therapy)    Contact information:   [address] [place], [place] [address] Phone: [phone] Fax: [phone]      Patient denies SI/HI:   Yes,  denies SI/HI    Safety Planning and Suicide Prevention discussed:  Yes,  discussed with pt and pt's daughter.  See suicide prevention note.    Carmina Miller 07/12/2013, 12:13 PM

## 2013-07-12 NOTE — Progress Notes (Signed)
Adult Psychoeducational Group Note  Date:  07/12/2013 Time:  11:00AM  Group Topic/Focus:  Crisis Planning:   The purpose of this group is to help patients create a crisis plan for use upon discharge or in the future, as needed.  Participation Level:  Active  Participation Quality:  Appropriate  Affect:  Flat  Cognitive:  Appropriate  Insight: Appropriate  Engagement in Group:  Engaged  Modes of Intervention:  Discussion  Additional Comments:  Patient was able to identify her triggers and her support persons.   Zacarias Pontes R 07/12/2013, 3:38 PM

## 2013-07-12 NOTE — Discharge Summary (Signed)
Physician Discharge Summary Note  Patient:  Jaclyn [name] is an 57 y.o., female MRN:  [id number] DOB:  31-Jan-1956 Patient phone:  [phone] (home)  Patient address:   [address] [place] [address]   Date of Admission:  07/06/2013 Date of Discharge: 07/12/13  Discharge Diagnoses: Active Problems:   Bipolar depression  Axis Diagnosis:  AXIS I: Bipolar Depression  AXIS II: Deferred  AXIS III:  Past Medical History   Diagnosis  Date   .  Diabetes mellitus without complication    .  Hypertension    .  Arthritis    .  Depression     AXIS IV: other psychosocial or environmental problems  AXIS V: 61-70 mild symptoms  Level of Care:  OP  Hospital Course:   [name] is an 57 y.o. female with history off depression and anxiety. She presents here to [facility] with her daughter complaining of worsening depression "for a while now". She feels as if her cymbalta is no longer working, saw her psychiatrist on 7/3 at [facility] and was told not to stop it which she did anyways. Patient states that she terminated using her medication due to the following side effects: not sleeping week, poor appetite, loosing wt., muscle spasms, increased anxiety, nausea, and crying spells. Pt afraid that she will not be normal or get on the right medications. She reports this medication issue has triggered suicidal thoughts. She denies plan or prior attempts. The patient talks about how her medications are making her worse and feels "That I am not on the right medicine. But nobody has been able to help me because I did not have medicaid. I have it now." She talks about behavior problems stating "If I get something in my hand I will end up throwing it. I have torn up my house because of my anger." Patient feels she has some memory problem from past suicide attempt where she overdosed in October then went to die in the woods "I got hypothermia." The patient has been feeling so bad emotionally recently that she  states "I feel like I 'm heading back to that place where I might overdose again. I have not been eating or drinking like I should. The medicine I was on is killing me."   While a patient in this hospital, Jaclyn [name] was enrolled in group counseling and activities as well as received the following medication Current facility-administered medications:acetaminophen (TYLENOL) tablet 650 mg, 650 mg, Oral, Q6H PRN, Kerry Hough, PA-C, 650 mg at 07/11/13 1107;  alum & mag hydroxide-simeth (MAALOX/MYLANTA) 200-200-20 MG/5ML suspension 30 mL, 30 mL, Oral, Q4H PRN, Mena Goes Simon, PA-C;  enalapril (VASOTEC) tablet 10 mg, 10 mg, Oral, Daily, Kerry Hough, PA-C, 10 mg at 07/12/13 0825 feeding supplement (GLUCERNA SHAKE) liquid 237 mL, 237 mL, Oral, QHS, Jeoffrey Massed, RD, 237 mL at 07/10/13 2018;  loratadine (CLARITIN) tablet 10 mg, 10 mg, Oral, Daily, Fransisca Kaufmann, NP, 10 mg at 07/11/13 1600;  magnesium hydroxide (MILK OF MAGNESIA) suspension 30 mL, 30 mL, Oral, Daily PRN, Kerry Hough, PA-C;  meloxicam (MOBIC) tablet 15 mg, 15 mg, Oral, Daily, Kerry Hough, PA-C, 15 mg at 07/12/13 9528 metFORMIN (GLUCOPHAGE) tablet 500 mg, 500 mg, Oral, BID WC, Spencer E Simon, PA-C, 500 mg at 07/12/13 0825;  multivitamin with minerals tablet 1 tablet, 1 tablet, Oral, Daily, Jeoffrey Massed, RD, 1 tablet at 07/12/13 0826;  pseudoephedrine (SUDAFED) tablet 30 mg, 30 mg, Oral, Q6H PRN,  Fransisca Kaufmann, NP, 30 mg at 07/12/13 1610;  risperiDONE (RISPERDAL) tablet 0.5 mg, 0.5 mg, Oral, BID, Verne Spurr, PA-C, 0.5 mg at 07/12/13 9604 sertraline (ZOLOFT) tablet 50 mg, 50 mg, Oral, Daily, Fransisca Kaufmann, NP, 50 mg at 07/12/13 5409;  traZODone (DESYREL) tablet 50 mg, 50 mg, Oral, QHS,MR X 1, Spencer E Simon, PA-C, 50 mg at 07/10/13 2117 Patient reported on admission that her prior medications were not helping her and making her mood worse. Her Cymbalta was not continued for this reason. Kennette also reported having "episodes of rage where  I destroy my house. It's really a problem." The patient was started on Zoloft 50 mg for depression and Risperdal 0.5 mg BID to improve her anger and mood control. The patient had a fast response to her medication regimen and began to report feeling "much calmer." Patient attended the groups and worked hard to develop healthy coping skills. The patient has a daughter who is very supportive of her. Patient attended treatment team meeting this am and met with treatment team members. Pt symptoms, treatment plan and response to treatment discussed. Mercedes Meharg endorsed that their symptoms have improved. Pt also stated that they are stable for discharge.  In other to control Active Problems:   Bipolar depression , they will continue psychiatric care on outpatient basis. They will follow-up at  Follow-up Information   Follow up with [facility] On 07/19/2013. (Appointment scheduled at 9:00 am for intake appointment, for medication management and therapy)    Contact information:   [address] [place], [place] [address] Phone: [phone] Fax: [phone]    .  In addition they were instructed to take all your medications as prescribed by your mental healthcare provider, to report any adverse effects and or reactions from your medicines to your outpatient provider promptly, patient is instructed and cautioned to not engage in alcohol and or illegal drug use while on prescription medicines, in the event of worsening symptoms, patient is instructed to call the crisis hotline, 911 and or go to the nearest ED for appropriate evaluation and treatment of symptoms.   Upon discharge, patient adamantly denies suicidal, homicidal ideations, auditory, visual hallucinations and or delusional thinking. They left [facility] with all personal belongings in no apparent distress.  Consults:  See electronic record for details  Significant Diagnostic Studies:  See electronic record for  details  Discharge Vitals:   Blood pressure 166/91, pulse 81, temperature 98.3 F (36.8 C), temperature source Oral, resp. rate 16, height 5' 4.57" (1.64 m), weight 47.401 kg (104 lb 8 oz)..  Mental Status Exam: See Mental Status Examination and Suicide Risk Assessment completed by Attending Physician prior to discharge.  Discharge destination:  Home  Is patient on multiple antipsychotic therapies at discharge:  No  Has Patient had three or more failed trials of antipsychotic monotherapy by history: N/A Recommended Plan for Multiple Antipsychotic Therapies: N/A    Medication List    STOP taking these medications       DULoxetine 60 MG capsule  Commonly known as:  CYMBALTA     hydrOXYzine 25 MG tablet  Commonly known as:  ATARAX/VISTARIL      TAKE these medications     Indication   enalapril 10 MG tablet  Commonly known as:  VASOTEC  Take 1 tablet (10 mg total) by mouth daily.   Indication:  High Blood Pressure     meloxicam 15 MG tablet  Commonly known as:  MOBIC  Take 1 tablet (15 mg total) by mouth daily.   Indication:  Joint Damage causing Pain and Loss of Function     metFORMIN 500 MG tablet  Commonly known as:  GLUCOPHAGE  Take 1 tablet (500 mg total) by mouth 2 (two) times daily with a meal.   Indication:  Type 2 Diabetes     risperiDONE 0.5 MG tablet  Commonly known as:  RISPERDAL  Take 1 tablet (0.5 mg total) by mouth 2 (two) times daily. For mood control.   Indication:  Easily Angered or Annoyed, Mood stability, Anxiety     sertraline 50 MG tablet  Commonly known as:  ZOLOFT  Take 1 tablet (50 mg total) by mouth daily. For depression.   Indication:  Anxiety Disorder, Major Depressive Disorder     traZODone 50 MG tablet  Commonly known as:  DESYREL  Take 1 tablet (50 mg total) by mouth at bedtime and may repeat dose one time if needed. For sleep.   Indication:  Trouble Sleeping, Major Depressive Disorder           Follow-up Information   Follow  up with [facility] On 07/19/2013. (Appointment scheduled at 9:00 am for intake appointment, for medication management and therapy)    Contact information:   [address] [place], [place] [address] Phone: [phone] Fax: [phone]     Follow-up recommendations:   Activities: Resume typical activities Diet: Resume typical diet Tests: none Other: Follow up with outpatient provider and report any side effects to out patient prescriber. Continue to work on the life style changes that could help better manage your mood disorder Comments:  Take all your medications as prescribed by your mental healthcare provider. Report any adverse effects and or reactions from your medicines to your outpatient provider promptly. Patient is instructed and cautioned to not engage in alcohol and or illegal drug use while on prescription medicines. In the event of worsening symptoms, patient is instructed to call the crisis hotline, 911 and or go to the nearest ED for appropriate evaluation and treatment of symptoms. Follow-up with your primary care provider for your other medical issues, concerns and or health care needs.  SignedFransisca Kaufmann NP-C 07/12/2013 10:25 AM  Agree with assessment and plan Reymundo Poll. Dub Mikes, M.D.

## 2013-07-12 NOTE — [facility] Suicide Risk Assessment (Signed)
[facility] INPATIENT:  Family/Significant Other Suicide Prevention Education  Suicide Prevention Education:  Education Completed; Dallys Nowakowski - daughter [phone]),  (name of family member/significant other) has been identified by the patient as the family member/significant other with whom the patient will be residing, and identified as the person(s) who will aid the patient in the event of a mental health crisis (suicidal ideations/suicide attempt).  With written consent from the patient, the family member/significant other has been provided the following suicide prevention education, prior to the and/or following the discharge of the patient.  The suicide prevention education provided includes the following:  Suicide risk factors  Suicide prevention and interventions  National Suicide Hotline telephone number  [facility] assessment telephone number  [facility] Emergency Assistance 911  [facility] and/or Residential Mobile Crisis Unit telephone number  Request made of family/significant other to:  Remove weapons (e.g., guns, rifles, knives), all items previously/currently identified as safety concern.    Remove drugs/medications (over-the-counter, prescriptions, illicit drugs), all items previously/currently identified as a safety concern.  The family member/significant other verbalizes understanding of the suicide prevention education information provided.  The family member/significant other agrees to remove the items of safety concern listed above.  Carmina Miller 07/12/2013, 10:16 AM

## 2013-07-12 NOTE — Progress Notes (Signed)
Pt discharged per MD orders; pt currently denies SI/HI and auditory/visual hallucinations; pt was given education by RN regarding follow-up appointments and medications and pt denied any questions or concerns about these instructions; pt was then escorted to search room to retrieve her belongings by RN before being discharged to hospital lobby. 

## 2013-07-12 NOTE — [facility] Suicide Risk Assessment (Signed)
Suicide Risk Assessment  Discharge Assessment     Demographic Factors:  Caucasian  Mental Status Per Nursing Assessment::   On Admission:  Self-harm thoughts  Current Mental Status by Physician: in full cotnact with reality. There are no suicidal ideas, plans or intent. Her mood is euthymic, her affect is appropriate. She is feeling so much better. states that this time around is different in that she has undertood that thisis a chronic condition and that she needs to learn of how tomanage it.  Loss Factors: NA  Historical Factors: NA  Risk Reduction Factors:   Sense of responsibility to family and Positive social support  Continued Clinical Symptoms:  Bipolar Disorder:   Depressive phase  Cognitive Features That Contribute To Risk:  Thought constriction (tunnel vision)    Suicide Risk:  Minimal: No identifiable suicidal ideation.  Patients presenting with no risk factors but with morbid ruminations; may be classified as minimal risk based on the severity of the depressive symptoms  Discharge Diagnoses:   AXIS I:  Bipolar Depression AXIS II:  Deferred AXIS III:   Past Medical History  Diagnosis Date  . Diabetes mellitus without complication   . Hypertension   . Arthritis   . Depression    AXIS IV:  other psychosocial or environmental problems AXIS V:  61-70 mild symptoms  Plan Of Care/Follow-up recommendations:  Activity:  as tolerated Diet:  regular Follow up outpatient basis individual/group therapy/medications Is patient on multiple antipsychotic therapies at discharge:  No   Has Patient had three or more failed trials of antipsychotic monotherapy by history:  No  Recommended Plan for Multiple Antipsychotic Therapies: N/A   Deina Lipsey A 07/12/2013, 1:10 PM

## 2013-07-12 NOTE — [facility] Group Notes (Signed)
[facility] LCSW Group Therapy  07/12/2013  1:15 PM   Type of Therapy:  Group Therapy  Participation Level:  Active  Participation Quality:  Appropriate and Attentive  Affect:  Appropriate and Calm  Cognitive:  Alert and Appropriate  Insight:  Developing/Improving and Engaged  Engagement in Therapy:  Developing/Improving and Engaged  Modes of Intervention:  Clarification, Confrontation, Discussion, Education, Exploration, Limit-setting, Orientation, Problem-solving, Rapport Building, Dance movement psychotherapist, Socialization and Support  Summary of Progress/Problems: The topic for group today was emotional regulation.  This group focused on both positive and negative emotion identification and allowed group members to process ways to identify feelings, regulate negative emotions, and find healthy ways to manage internal/external emotions. Group members were asked to reflect on a time when their reaction to an emotion led to a negative outcome and explored how alternative responses using emotion regulation would have benefited them. Group members were also asked to discuss a time when emotion regulation was utilized when a negative emotion was experienced. Pt shared that she deals with the emotions of fear, fear of being judged, and has no confidence and poor self esteem.  Pt states that because of these emotions she was isolating and having SI.  Pt states that she listened to groups here and was open to treatment, and therefor feels she has learned more this time.  Pt actively participated and was engaged in group discussion.    Reyes Ivan, [place] 07/12/2013 2:36 PM

## 2013-07-12 NOTE — [facility] Group Notes (Signed)
[facility] LCSW Aftercare Discharge Planning Group Note   07/12/2013 8:45 AM  Participation Quality:  Alert and Appropriate   Mood/Affect:  Appropriate and Calm  Depression Rating:  7  Anxiety Rating:  3  Thoughts of Suicide:  Pt denies SI/HI  Will you contract for safety?   Yes  Current AVH:  Pt denies  Plan for Discharge/Comments:  Pt attended discharge planning group and actively participated in group.  CSW provided pt with today's workbook.  Pt states that she feels better today and feels ready for d/c today.  Pt states that she will return home in [place] with daughter.   Pt has follow up scheduled at [facility] for medication management and therapy.  No further needs voiced by pt at this time.    Transportation Means: Pt reports daughter will pick her up  Supports: Pt states that her daughter is her main support  Reyes Ivan, LCSWA 07/12/2013 12:12 PM

## 2013-07-12 NOTE — Progress Notes (Signed)
Recreation Therapy Notes   Date: 07.16.2014 Time: 3:00pm Location: [address] Day[address]      Group Topic/Focus: Leisure Education  Participation Level: Did not attend.   Jaclyn [name], LRT/CTRS  Jearl Klinefelter 07/12/2013 4:31 PM

## 2013-07-17 NOTE — Progress Notes (Signed)
Patient Discharge Instructions:  After Visit Summary (AVS):   Faxed to:  07/17/13 Discharge Summary Note:   Faxed to:  07/17/13 Psychiatric Admission Assessment Note:   Faxed to:  07/17/13 Suicide Risk Assessment - Discharge Assessment:   Faxed to:  07/17/13 Faxed/Sent to the Next Level Care provider:  07/17/13 Faxed to [facility] @ [phone]  Jerelene Redden, 07/17/2013, 4:16 PM

## 2013-07-19 NOTE — Progress Notes (Signed)
Agree with assessment and plan Shauna Bodkins A. Solomon Skowronek, M.D. 

## 2013-09-27 ENCOUNTER — Encounter (HOSPITAL_COMMUNITY): Payer: Self-pay | Admitting: Emergency Medicine

## 2013-09-27 ENCOUNTER — Emergency Department (HOSPITAL_COMMUNITY)
Admission: EM | Admit: 2013-09-27 | Discharge: 2013-09-27 | Disposition: A | Discharge status: Home / Self Care | Payer: Medicaid Other | Attending: Emergency Medicine | Admitting: Emergency Medicine

## 2013-09-27 ENCOUNTER — Emergency Department (HOSPITAL_COMMUNITY): Payer: Medicaid Other

## 2013-09-27 DIAGNOSIS — I10 Essential (primary) hypertension: Secondary | ICD-10-CM

## 2013-09-27 DIAGNOSIS — M129 Arthropathy, unspecified: Secondary | ICD-10-CM | POA: Insufficient documentation

## 2013-09-27 DIAGNOSIS — R0602 Shortness of breath: Secondary | ICD-10-CM | POA: Insufficient documentation

## 2013-09-27 DIAGNOSIS — E119 Type 2 diabetes mellitus without complications: Secondary | ICD-10-CM | POA: Insufficient documentation

## 2013-09-27 DIAGNOSIS — Z791 Long term (current) use of non-steroidal anti-inflammatories (NSAID): Secondary | ICD-10-CM | POA: Insufficient documentation

## 2013-09-27 DIAGNOSIS — F329 Major depressive disorder, single episode, unspecified: Secondary | ICD-10-CM | POA: Insufficient documentation

## 2013-09-27 DIAGNOSIS — Z79899 Other long term (current) drug therapy: Secondary | ICD-10-CM | POA: Insufficient documentation

## 2013-09-27 DIAGNOSIS — F3289 Other specified depressive episodes: Secondary | ICD-10-CM | POA: Insufficient documentation

## 2013-09-27 DIAGNOSIS — R51 Headache: Secondary | ICD-10-CM | POA: Insufficient documentation

## 2013-09-27 LAB — CBC WITH DIFFERENTIAL/PLATELET
Basophils Absolute: 0 10*3/uL (ref 0.0–0.1)
Eosinophils Relative: 1 % (ref 0–5)
HCT: 35.7 % — ABNORMAL LOW (ref 36.0–46.0)
Hemoglobin: 12 g/dL (ref 12.0–15.0)
Lymphocytes Relative: 36 % (ref 12–46)
Lymphs Abs: 2.3 10*3/uL (ref 0.7–4.0)
MCV: 84.4 fL (ref 78.0–100.0)
Monocytes Absolute: 0.5 10*3/uL (ref 0.1–1.0)
Monocytes Relative: 9 % (ref 3–12)
Neutro Abs: 3.4 10*3/uL (ref 1.7–7.7)
RBC: 4.23 MIL/uL (ref 3.87–5.11)
WBC: 6.3 10*3/uL (ref 4.0–10.5)

## 2013-09-27 LAB — BASIC METABOLIC PANEL
BUN: 10 mg/dL (ref 6–23)
CO2: 26 mEq/L (ref 19–32)
Calcium: 9.6 mg/dL (ref 8.4–10.5)
Chloride: 97 mEq/L (ref 96–112)
Creatinine, Ser: 0.44 mg/dL — ABNORMAL LOW (ref 0.50–1.10)
Glucose, Bld: 328 mg/dL — ABNORMAL HIGH (ref 70–99)

## 2013-09-27 LAB — PRO B NATRIURETIC PEPTIDE: Pro B Natriuretic peptide (BNP): 145.8 pg/mL — ABNORMAL HIGH (ref 0–125)

## 2013-09-27 MED ORDER — TRAZODONE HCL 50 MG PO TABS: 50.0000 mg | ORAL_TABLET | Freq: Every evening | ORAL | Status: DC | PRN

## 2013-09-27 MED ORDER — METFORMIN HCL 500 MG PO TABS: 500.0000 mg | ORAL_TABLET | Freq: Two times a day (BID) | ORAL | Status: DC

## 2013-09-27 MED ORDER — RISPERIDONE 0.5 MG PO TABS: 0.5000 mg | ORAL_TABLET | Freq: Two times a day (BID) | ORAL | Status: DC

## 2013-09-27 MED ORDER — ENALAPRIL MALEATE 10 MG PO TABS: 10.0000 mg | ORAL_TABLET | Freq: Every day | ORAL | Status: DC

## 2013-09-27 MED ORDER — SERTRALINE HCL 50 MG PO TABS: 50.0000 mg | ORAL_TABLET | Freq: Every day | ORAL | Status: DC

## 2013-09-27 NOTE — ED Provider Notes (Signed)
CSN: [id number]     Arrival date & time 09/27/13  1735 History   First MD Initiated Contact with Patient 09/27/13 1931     Chief Complaint  Patient presents with  . Fatigue  . Shortness of Breath  . Headache    HPI Comments: Pt feels short of breath when she is active and when she is lying flat.  She is supposed to be on blood pressure medications but has not been on her medications for the last month.  Patient is a 57 y.o. female presenting with shortness of breath and headaches. The history is provided by the patient.  Shortness of Breath Severity:  Moderate Onset quality:  Gradual Duration:  2 days Timing:  Constant Chronicity:  New Exacerbated by: lying flat. Ineffective treatments:  None tried Associated symptoms: headaches   Associated symptoms: no abdominal pain, no chest pain, no cough, no fever and no rash   Headache Associated symptoms: fatigue   Associated symptoms: no abdominal pain, no cough, no fever, no numbness and no seizures     Past Medical History  Diagnosis Date  . Diabetes mellitus without complication   . Hypertension   . Arthritis   . Depression    Past Surgical History  Procedure Laterality Date  . Tubal ligation    . Nasal septum surgery     No family history on file. History  Substance Use Topics  . Smoking status: Never Smoker   . Smokeless tobacco: Not on file  . Alcohol Use: No   OB History   Grav Para Term Preterm Abortions TAB SAB Ect Mult Living                 Review of Systems  Constitutional: Positive for fatigue. Negative for fever.  HENT:       Ears feel clogged, she has been trying q tips  Respiratory: Positive for shortness of breath. Negative for cough.   Cardiovascular: Negative for chest pain.  Gastrointestinal: Negative for abdominal pain.  Skin: Negative for rash.  Neurological: Positive for headaches. Negative for tremors, seizures, speech difficulty and numbness.  All other systems reviewed and are  negative.    Allergies  Codeine; Flagyl; Morphine and related; and Remeron  Home Medications   Current Outpatient Rx  Name  Route  Sig  Dispense  Refill  . enalapril (VASOTEC) 10 MG tablet   Oral   Take 1 tablet (10 mg total) by mouth daily.   30 tablet   1   . meloxicam (MOBIC) 15 MG tablet   Oral   Take 1 tablet (15 mg total) by mouth daily.         . metFORMIN (GLUCOPHAGE) 500 MG tablet   Oral   Take 1 tablet (500 mg total) by mouth 2 (two) times daily with a meal.   60 tablet   0   . risperiDONE (RISPERDAL) 0.5 MG tablet   Oral   Take 1 tablet (0.5 mg total) by mouth 2 (two) times daily. For mood control.   60 tablet   0   . sertraline (ZOLOFT) 50 MG tablet   Oral   Take 1 tablet (50 mg total) by mouth daily. For depression.   30 tablet   0   . traZODone (DESYREL) 50 MG tablet   Oral   Take 1 tablet (50 mg total) by mouth at bedtime and may repeat dose one time if needed. For sleep.   60 tablet   0  BP 205/96  Pulse 63  Temp(Src) 98.7 F (37.1 C) (Oral)  Resp 16  SpO2 99% Physical Exam  Nursing note and vitals reviewed. Constitutional: She appears well-developed and well-nourished. No distress.  HENT:  Head: Normocephalic and atraumatic.  Right Ear: External ear normal.  Left Ear: External ear normal.  Eyes: Conjunctivae are normal. Right eye exhibits no discharge. Left eye exhibits no discharge. No scleral icterus.  Neck: Neck supple. No tracheal deviation present.  Cardiovascular: Normal rate, regular rhythm and intact distal pulses.   Pulmonary/Chest: Effort normal and breath sounds normal. No stridor. No respiratory distress. She has no wheezes. She has no rales.  Abdominal: Soft. Bowel sounds are normal. She exhibits no distension. There is no tenderness. There is no rebound and no guarding.  Musculoskeletal: She exhibits no edema and no tenderness.  Neurological: She is alert. She has normal strength. No sensory deficit. Cranial nerve  deficit:  no gross defecits noted. She exhibits normal muscle tone. She displays no seizure activity. Coordination normal.  Skin: Skin is warm and dry. No rash noted.  Psychiatric: She has a normal mood and affect.    ED Course  Procedures (including critical care time) EKG Normal sinus rhythm rate 57 Normal axis, normal intervals Normal ST-T wave no significant change when compared to prior EKG Labs Review Labs Reviewed  CBC WITH DIFFERENTIAL - Abnormal; Notable for the following:    HCT 35.7 (*)    All other components within normal limits  BASIC METABOLIC PANEL - Abnormal; Notable for the following:    Sodium 134 (*)    Glucose, Bld 328 (*)    Creatinine, Ser 0.44 (*)    All other components within normal limits  PRO B NATRIURETIC PEPTIDE - Abnormal; Notable for the following:    Pro B Natriuretic peptide (BNP) 145.8 (*)    All other components within normal limits  POCT I-STAT TROPONIN I   Imaging Review Dg Chest 2 View  09/27/2013   CLINICAL DATA:  57 year old female with shortness of breath.  EXAM: CHEST  2 VIEW  COMPARISON:  None.  FINDINGS: The cardiomediastinal silhouette is unremarkable.  The lungs are clear.  There is no evidence of focal airspace disease, pulmonary edema, suspicious pulmonary nodule/mass, pleural effusion, or pneumothorax. No acute bony abnormalities are identified.  IMPRESSION: No evidence of active cardiopulmonary disease.   Electronically Signed   By: Laveda Abbe M.D.   On: 09/27/2013 20:15    MDM   1. Hypertension    No signs of CHF, pna, ptx.  Doubt PE.  Low Risk.   Will refill her medications.  Follow  Up with PCP as an outpatient.    Celene Kras, MD 09/28/13 0001

## 2013-09-27 NOTE — ED Notes (Signed)
PT states that she has been fatigued with headache and SOB x 2 days.  NAD.

## 2013-11-07 ENCOUNTER — Ambulatory Visit (INDEPENDENT_AMBULATORY_CARE_PROVIDER_SITE_OTHER): Payer: BC Managed Care – PPO | Admitting: Family

## 2013-11-07 ENCOUNTER — Encounter: Payer: Self-pay | Admitting: Family

## 2013-11-07 VITALS — BP 138/64 | HR 71 | Wt 121.0 lb

## 2013-11-07 DIAGNOSIS — I10 Essential (primary) hypertension: Secondary | ICD-10-CM

## 2013-11-07 DIAGNOSIS — IMO0002 Reserved for concepts with insufficient information to code with codable children: Secondary | ICD-10-CM

## 2013-11-07 DIAGNOSIS — Z23 Encounter for immunization: Secondary | ICD-10-CM

## 2013-11-07 DIAGNOSIS — F32A Depression, unspecified: Secondary | ICD-10-CM

## 2013-11-07 DIAGNOSIS — E1165 Type 2 diabetes mellitus with hyperglycemia: Secondary | ICD-10-CM

## 2013-11-07 DIAGNOSIS — F329 Major depressive disorder, single episode, unspecified: Secondary | ICD-10-CM

## 2013-11-07 LAB — BASIC METABOLIC PANEL
CO2: 29 mEq/L (ref 19–32)
Calcium: 9.1 mg/dL (ref 8.4–10.5)
Glucose, Bld: 424 mg/dL — ABNORMAL HIGH (ref 70–99)
Potassium: 3.9 mEq/L (ref 3.5–5.1)
Sodium: 136 mEq/L (ref 135–145)

## 2013-11-07 LAB — CBC WITH DIFFERENTIAL/PLATELET
Basophils Relative: 0.3 % (ref 0.0–3.0)
Eosinophils Absolute: 0 10*3/uL (ref 0.0–0.7)
HCT: 36.2 % (ref 36.0–46.0)
Hemoglobin: 12.1 g/dL (ref 12.0–15.0)
Lymphocytes Relative: 23 % (ref 12.0–46.0)
Lymphs Abs: 1.4 10*3/uL (ref 0.7–4.0)
MCHC: 33.5 g/dL (ref 30.0–36.0)
Monocytes Relative: 5.5 % (ref 3.0–12.0)
Neutro Abs: 4.4 10*3/uL (ref 1.4–7.7)
RBC: 4.25 Mil/uL (ref 3.87–5.11)

## 2013-11-07 LAB — HEPATIC FUNCTION PANEL
AST: 12 U/L (ref 0–37)
Albumin: 4.1 g/dL (ref 3.5–5.2)
Alkaline Phosphatase: 54 U/L (ref 39–117)
Total Protein: 7.2 g/dL (ref 6.0–8.3)

## 2013-11-07 LAB — HEMOGLOBIN A1C: Hgb A1c MFr Bld: 12.7 % — ABNORMAL HIGH (ref 4.6–6.5)

## 2013-11-07 MED ORDER — ENALAPRIL MALEATE 10 MG PO TABS: 10.0000 mg | ORAL_TABLET | Freq: Every day | ORAL | Status: DC

## 2013-11-07 MED ORDER — METFORMIN HCL 500 MG PO TABS: 500.0000 mg | ORAL_TABLET | Freq: Two times a day (BID) | ORAL | Status: DC

## 2013-11-07 NOTE — Progress Notes (Signed)
Subjective:    Patient ID: Jaclyn [name], female    DOB: 28-Sep-1956, 57 y.o.   MRN: [id number]  HPI  57 year old new patient to the practice is in today to be established. She has a history type 2 diabetes, hypertension, depression, anxiety with suicide attempt. She is currently in the care of psychiatry. She has not seen a primary care provider in a long time. Is overdue on health maintenance. Declining mammogram. Reports have a colonoscopy 2 years ago.  Review of Systems  Constitutional: Negative.   HENT: Negative.   Respiratory: Negative.   Cardiovascular: Negative.   Gastrointestinal: Negative.   Endocrine: Negative.   Genitourinary: Negative.   Musculoskeletal: Negative.   Skin: Negative.   Allergic/Immunologic: Negative.   Neurological: Negative.   Hematological: Negative.   Psychiatric/Behavioral: Negative.    Past Medical History  Diagnosis Date  . Diabetes mellitus without complication   . Hypertension   . Arthritis   . Depression     History   Social History  . Marital Status: Single    Spouse Name: N/A    Number of Children: N/A  . Years of Education: N/A   Occupational History  . Not on file.   Social History Main Topics  . Smoking status: Never Smoker   . Smokeless tobacco: Not on file  . Alcohol Use: No  . Drug Use: No  . Sexual Activity: Not Currently   Other Topics Concern  . Not on file   Social History Narrative  . No narrative on file    Past Surgical History  Procedure Laterality Date  . Tubal ligation    . Nasal septum surgery      History reviewed. No pertinent family history.  Allergies  Allergen Reactions  . Codeine Nausea And Vomiting    States that she cannot take in high doses  . Flagyl [Metronidazole] Other (See Comments)    Fast heartbeat,tingly  . Morphine And Related Nausea And Vomiting    States that she cannot take in high doses  . Remeron [Mirtazapine] Other (See Comments)    Severe  restlessness,irritable,hyperactivity,agitation    Current Outpatient Prescriptions on File Prior to Visit  Medication Sig Dispense Refill  . meloxicam (MOBIC) 15 MG tablet Take 1 tablet (15 mg total) by mouth daily.      . risperiDONE (RISPERDAL) 0.5 MG tablet Take 1 tablet (0.5 mg total) by mouth 2 (two) times daily. For mood control.  60 tablet  0  . sertraline (ZOLOFT) 50 MG tablet Take 1 tablet (50 mg total) by mouth daily. For depression.  30 tablet  0  . traZODone (DESYREL) 50 MG tablet Take 1 tablet (50 mg total) by mouth at bedtime and may repeat dose one time if needed. For sleep.  60 tablet  0   No current facility-administered medications on file prior to visit.    BP 138/64  Pulse 71  Wt 121 lb (54.885 kg)chart    Objective:   Physical Exam  Constitutional: She is oriented to person, place, and time. She appears well-developed and well-nourished.  HENT:  Right Ear: External ear normal.  Left Ear: External ear normal.  Nose: Nose normal.  Mouth/Throat: Oropharynx is clear and moist.  Neck: Normal range of motion. Neck supple.  Cardiovascular: Normal rate, regular rhythm and normal heart sounds.   Pulmonary/Chest: Effort normal and breath sounds normal.  Abdominal: Soft. Bowel sounds are normal.  Musculoskeletal: Normal range of motion.  Neurological: She is alert and  oriented to person, place, and time. She has normal reflexes.  Skin: Skin is warm and dry.  Psychiatric: She has a normal mood and affect.          Assessment & Plan:  Assessment:  1. Type 2 diabetes-uncontrolled 2. Hypertension 3. Depression 4. Anxiety  Plan: Lab sent to include A1c, CBC, LFTs, BMP with an admission for the results. Her sodium diet, exercise, low sodium. We'll followup with patient for complete physical exam in 4 months and sooner as needed.

## 2013-11-07 NOTE — Patient Instructions (Signed)

## 2013-11-07 NOTE — Progress Notes (Signed)
Pre-visit discussion using our clinic review tool. No additional management support is needed unless otherwise documented below in the visit note.  

## 2014-01-03 ENCOUNTER — Encounter: Payer: Self-pay | Admitting: Family

## 2014-01-03 ENCOUNTER — Ambulatory Visit (INDEPENDENT_AMBULATORY_CARE_PROVIDER_SITE_OTHER): Payer: Self-pay | Admitting: Family

## 2014-01-03 VITALS — BP 128/76 | HR 79 | Wt 126.0 lb

## 2014-01-03 DIAGNOSIS — I10 Essential (primary) hypertension: Secondary | ICD-10-CM

## 2014-01-03 DIAGNOSIS — E1165 Type 2 diabetes mellitus with hyperglycemia: Secondary | ICD-10-CM

## 2014-01-03 DIAGNOSIS — F32A Depression, unspecified: Secondary | ICD-10-CM

## 2014-01-03 DIAGNOSIS — IMO0001 Reserved for inherently not codable concepts without codable children: Secondary | ICD-10-CM

## 2014-01-03 DIAGNOSIS — IMO0002 Reserved for concepts with insufficient information to code with codable children: Secondary | ICD-10-CM

## 2014-01-03 DIAGNOSIS — F3289 Other specified depressive episodes: Secondary | ICD-10-CM

## 2014-01-03 DIAGNOSIS — F329 Major depressive disorder, single episode, unspecified: Secondary | ICD-10-CM

## 2014-01-03 MED ORDER — METFORMIN HCL 500 MG PO TABS: 500.0000 mg | ORAL_TABLET | Freq: Two times a day (BID) | ORAL | Status: DC

## 2014-01-03 MED ORDER — SERTRALINE HCL 50 MG PO TABS: 50.0000 mg | ORAL_TABLET | Freq: Every day | ORAL | Status: DC

## 2014-01-03 MED ORDER — RISPERIDONE 0.5 MG PO TABS: 0.5000 mg | ORAL_TABLET | Freq: Two times a day (BID) | ORAL | Status: DC

## 2014-01-03 MED ORDER — TRAZODONE HCL 50 MG PO TABS: 50.0000 mg | ORAL_TABLET | Freq: Every evening | ORAL | Status: DC | PRN

## 2014-01-03 MED ORDER — ENALAPRIL MALEATE 10 MG PO TABS: 10.0000 mg | ORAL_TABLET | Freq: Every day | ORAL | Status: DC

## 2014-01-03 NOTE — Patient Instructions (Signed)

## 2014-01-03 NOTE — Progress Notes (Signed)
Subjective:    Patient ID: Jaclyn [name], female    DOB: 1956/01/04, 58 y.o.   MRN: [id number]  HPI 58 year old white female, nonsmoker then for recheck of hypertension, type 2 diabetes, bipolar disorder. She reports she is currently feeling well. Admits to not being compliant with medication regimen and her last office visit due to financial difficulties. However, since that time she has recovered. She's been taking her medication as prescribed. When her blood sugar is elevated he reports having urinary frequency and dry mouth. She does not routinely check her blood sugar.   Review of Systems  Constitutional: Negative.   HENT: Negative.   Respiratory: Negative.   Cardiovascular: Negative.   Gastrointestinal: Negative.   Endocrine: Positive for heat intolerance, polyphagia and polyuria. Negative for cold intolerance.  Genitourinary: Negative.   Musculoskeletal: Negative.   Skin: Negative.   Allergic/Immunologic: Negative.   Neurological: Negative.   Psychiatric/Behavioral: Negative.    Past Medical History  Diagnosis Date  . Diabetes mellitus without complication   . Hypertension   . Arthritis   . Depression     History   Social History  . Marital Status: Single    Spouse Name: N/A    Number of Children: N/A  . Years of Education: N/A   Occupational History  . Not on file.   Social History Main Topics  . Smoking status: Never Smoker   . Smokeless tobacco: Not on file  . Alcohol Use: No  . Drug Use: No  . Sexual Activity: Not Currently   Other Topics Concern  . Not on file   Social History Narrative  . No narrative on file    Past Surgical History  Procedure Laterality Date  . Tubal ligation    . Nasal septum surgery      History reviewed. No pertinent family history.  Allergies  Allergen Reactions  . Codeine Nausea And Vomiting    States that she cannot take in high doses  . Flagyl [Metronidazole] Other (See Comments)    Fast heartbeat,tingly  .  Morphine And Related Nausea And Vomiting    States that she cannot take in high doses  . Remeron [Mirtazapine] Other (See Comments)    Severe restlessness,irritable,hyperactivity,agitation    Current Outpatient Prescriptions on File Prior to Visit  Medication Sig Dispense Refill  . meloxicam (MOBIC) 15 MG tablet Take 1 tablet (15 mg total) by mouth daily.       No current facility-administered medications on file prior to visit.    BP 128/76  Pulse 79  Wt 126 lb (57.153 kg)chart    Objective:   Physical Exam  Constitutional: She is oriented to person, place, and time. She appears well-developed and well-nourished.  HENT:  Right Ear: External ear normal.  Left Ear: External ear normal.  Nose: Nose normal.  Mouth/Throat: Oropharynx is clear and moist.  Neck: Normal range of motion. Neck supple. No thyromegaly present.  Cardiovascular: Normal rate, regular rhythm and normal heart sounds.   Pulmonary/Chest: Effort normal and breath sounds normal.  Abdominal: Soft. Bowel sounds are normal. She exhibits no distension. There is no tenderness. There is no rebound.  Musculoskeletal: Normal range of motion.  Neurological: She is alert and oriented to person, place, and time.  Skin: Skin is warm and dry.  Psychiatric: She has a normal mood and affect.          Assessment & Plan:  Assessment: 1. Type 2 diabetes 2. Hypertension 3.: Depression  Plan: Lab sent  to include A1c, BMP, CBC, LFTs, TSH notify patient of results. Encouraged healthy diet, exercise, nightly feet checks, diabetic eye exams annually. We'll follow the patient for complete physical exam in 3 months and sooner as needed.

## 2014-01-04 ENCOUNTER — Telehealth: Payer: Self-pay

## 2014-01-04 ENCOUNTER — Telehealth: Payer: Self-pay | Admitting: Family

## 2014-01-04 LAB — CBC WITH DIFFERENTIAL/PLATELET
BASOS ABS: 0 10*3/uL (ref 0.0–0.1)
Basophils Relative: 0.5 % (ref 0.0–3.0)
Eosinophils Absolute: 0.1 10*3/uL (ref 0.0–0.7)
Eosinophils Relative: 0.9 % (ref 0.0–5.0)
HEMATOCRIT: 35.9 % — AB (ref 36.0–46.0)
HEMOGLOBIN: 12.3 g/dL (ref 12.0–15.0)
LYMPHS ABS: 1.4 10*3/uL (ref 0.7–4.0)
Lymphocytes Relative: 22.4 % (ref 12.0–46.0)
MCHC: 34.3 g/dL (ref 30.0–36.0)
MCV: 85 fl (ref 78.0–100.0)
MONO ABS: 0.5 10*3/uL (ref 0.1–1.0)
Monocytes Relative: 8.3 % (ref 3.0–12.0)
NEUTROS ABS: 4.1 10*3/uL (ref 1.4–7.7)
Neutrophils Relative %: 67.9 % (ref 43.0–77.0)
PLATELETS: 233 10*3/uL (ref 150.0–400.0)
RBC: 4.22 Mil/uL (ref 3.87–5.11)
RDW: 13.2 % (ref 11.5–14.6)
WBC: 6.1 10*3/uL (ref 4.5–10.5)

## 2014-01-04 LAB — HEPATIC FUNCTION PANEL
ALK PHOS: 50 U/L (ref 39–117)
ALT: 19 U/L (ref 0–35)
AST: 17 U/L (ref 0–37)
Albumin: 4.3 g/dL (ref 3.5–5.2)
BILIRUBIN DIRECT: 0 mg/dL (ref 0.0–0.3)
BILIRUBIN TOTAL: 0.4 mg/dL (ref 0.3–1.2)
Total Protein: 7.2 g/dL (ref 6.0–8.3)

## 2014-01-04 LAB — BASIC METABOLIC PANEL
BUN: 12 mg/dL (ref 6–23)
CALCIUM: 9.5 mg/dL (ref 8.4–10.5)
CO2: 31 mEq/L (ref 19–32)
Chloride: 96 mEq/L (ref 96–112)
Creatinine, Ser: 0.8 mg/dL (ref 0.4–1.2)
GFR: 83.83 mL/min (ref >60.00)
Glucose, Bld: 504 mg/dL (ref 70–99)
POTASSIUM: 4.2 meq/L (ref 3.5–5.1)
SODIUM: 138 meq/L (ref 135–145)

## 2014-01-04 LAB — MICROALBUMIN / CREATININE URINE RATIO
CREATININE, U: 22.5 mg/dL
MICROALB UR: 0.2 mg/dL (ref 0.0–1.9)
MICROALB/CREAT RATIO: 0.9 mg/g (ref 0.0–30.0)

## 2014-01-04 LAB — TSH: TSH: 2.72 u[IU]/mL (ref 0.35–5.50)

## 2014-01-04 LAB — HEMOGLOBIN A1C: Hgb A1c MFr Bld: 11.8 % — ABNORMAL HIGH (ref 4.6–6.5)

## 2014-01-04 MED ORDER — GLIPIZIDE 10 MG PO TABS: 10.0000 mg | ORAL_TABLET | Freq: Two times a day (BID) | ORAL | Status: DC

## 2014-01-04 NOTE — Telephone Encounter (Signed)
Relevant patient education mailed to patient.  

## 2014-01-04 NOTE — Telephone Encounter (Signed)
Pt would like blood work results and pt has concerns about  Starting new medication.

## 2014-01-04 NOTE — Telephone Encounter (Signed)
Jaclyn [name] from [facility] called to make aware that pt's glucose is 504.  Pls advise

## 2014-01-04 NOTE — Telephone Encounter (Signed)
Per Oran ReinPadonda, send glipizide 10 bid to pharmacy. Left message to advise pt's daughter of addition and copy of results mailed

## 2014-01-08 NOTE — Telephone Encounter (Signed)
Left message for pt to call back  °

## 2014-01-09 NOTE — Telephone Encounter (Signed)
Left another message for pt to call back.

## 2014-01-30 ENCOUNTER — Emergency Department (HOSPITAL_COMMUNITY)
Admission: EM | Admit: 2014-01-30 | Discharge: 2014-01-30 | Disposition: A | Discharge status: Home / Self Care | Payer: Medicaid Other | Attending: Emergency Medicine | Admitting: Emergency Medicine

## 2014-01-30 ENCOUNTER — Encounter (HOSPITAL_COMMUNITY): Payer: Self-pay | Admitting: Emergency Medicine

## 2014-01-30 DIAGNOSIS — Z79899 Other long term (current) drug therapy: Secondary | ICD-10-CM | POA: Insufficient documentation

## 2014-01-30 DIAGNOSIS — F3289 Other specified depressive episodes: Secondary | ICD-10-CM | POA: Insufficient documentation

## 2014-01-30 DIAGNOSIS — F411 Generalized anxiety disorder: Secondary | ICD-10-CM | POA: Insufficient documentation

## 2014-01-30 DIAGNOSIS — F329 Major depressive disorder, single episode, unspecified: Secondary | ICD-10-CM | POA: Insufficient documentation

## 2014-01-30 DIAGNOSIS — E119 Type 2 diabetes mellitus without complications: Secondary | ICD-10-CM | POA: Insufficient documentation

## 2014-01-30 DIAGNOSIS — M129 Arthropathy, unspecified: Secondary | ICD-10-CM | POA: Insufficient documentation

## 2014-01-30 DIAGNOSIS — I10 Essential (primary) hypertension: Secondary | ICD-10-CM | POA: Insufficient documentation

## 2014-01-30 DIAGNOSIS — J329 Chronic sinusitis, unspecified: Secondary | ICD-10-CM | POA: Insufficient documentation

## 2014-01-30 HISTORY — DX: Anxiety disorder, unspecified: F41.9

## 2014-01-30 MED ORDER — AZITHROMYCIN 250 MG PO TABS: 250.0000 mg | ORAL_TABLET | Freq: Once | ORAL | Status: DC

## 2014-01-30 MED ORDER — SALINE SPRAY 0.65 % NA SOLN: 1.0000 | NASAL | Status: DC | PRN

## 2014-01-30 NOTE — ED Provider Notes (Signed)
CSN: [id number]     Arrival date & time 01/30/14  1847 History   This chart was scribed for non-physician practitioner Earley FavorGail  Rozann Holts, NP working with Celene KrasJon R Knapp, MD by Donne Anonayla Curran, ED Scribe. This patient was seen in room WTR7/WTR7 and the patient's care was started at 1956.   First MD Initiated Contact with Patient 01/30/14 1956     Chief Complaint  Patient presents with  . Nasal Congestion  . Cough    The history is provided by the patient. No language interpreter was used.   HPI Comments: Jaclyn [name] is a 58 y.o. female with hx of DM, HTN, arthritis, depression and anxiety, who presents to the Emergency Department complaining of 1 week of gradual onset, gradually worsening, moderate nasal congestion and cough. She also complains of associated right sided sinus tenderness and intermittent fever. She has tried Dayquil and Nyquil with little relief. She denies any other symptoms.   [facility] is her PCP. She could not get an appointment to be seen there. She is currently on disability.   Past Medical History  Diagnosis Date  . Diabetes mellitus without complication   . Hypertension   . Arthritis   . Depression   . Anxiety    Past Surgical History  Procedure Laterality Date  . Tubal ligation    . Nasal septum surgery     History reviewed. No pertinent family history. History  Substance Use Topics  . Smoking status: Never Smoker   . Smokeless tobacco: Never Used  . Alcohol Use: No   OB History   Grav Para Term Preterm Abortions TAB SAB Ect Mult Living                 Review of Systems  Constitutional: Negative for activity change.  HENT: Positive for congestion and sinus pressure. Negative for facial swelling.   Respiratory: Positive for cough. Negative for shortness of breath.   Gastrointestinal: Negative for nausea.  Neurological: Negative for dizziness and headaches.  All other systems reviewed and are negative.    Allergies  Codeine; Flagyl; Morphine and related;  and Remeron  Home Medications   Current Outpatient Rx  Name  Route  Sig  Dispense  Refill  . DM-Doxylamine-Acetaminophen (NYQUIL COLD & FLU PO)   Oral   Take 30 mLs by mouth at bedtime as needed (flu-like symptoms).         . enalapril (VASOTEC) 10 MG tablet   Oral   Take 1 tablet (10 mg total) by mouth daily.   90 tablet   1   . glipiZIDE (GLUCOTROL) 10 MG tablet   Oral   Take 1 tablet (10 mg total) by mouth 2 (two) times daily before a meal.   60 tablet   3   . metFORMIN (GLUCOPHAGE) 500 MG tablet   Oral   Take 1 tablet (500 mg total) by mouth 2 (two) times daily with a meal.   180 tablet   1   . risperiDONE (RISPERDAL) 0.5 MG tablet   Oral   Take 1 tablet (0.5 mg total) by mouth 2 (two) times daily. For mood control.   180 tablet   1   . sertraline (ZOLOFT) 50 MG tablet   Oral   Take 1 tablet (50 mg total) by mouth daily. For depression.   90 tablet   1   . azithromycin (ZITHROMAX Z-PAK) 250 MG tablet   Oral   Take 1 tablet (250 mg total) by mouth once.  6 tablet   0     Take 2 tablets on day one, and 1 tablet on days 2  ...   . sodium chloride (OCEAN) 0.65 % SOLN nasal spray   Each Nare   Place 1 spray into both nostrils as needed for congestion.   1 Bottle   0    BP 175/90  Pulse 77  Temp(Src) 98.4 F (36.9 C) (Oral)  Resp 16  Ht 5\' 3"  (1.6 m)  Wt 126 lb (57.153 kg)  BMI 22.33 kg/m2  SpO2 96%  Physical Exam  Nursing note and vitals reviewed. Constitutional: She appears well-developed and well-nourished. No distress.  HENT:  Head: Normocephalic and atraumatic.  Right Ear: External ear normal.  Left Ear: External ear normal.  Nose: Right sinus exhibits maxillary sinus tenderness and frontal sinus tenderness. Left sinus exhibits maxillary sinus tenderness and frontal sinus tenderness.  Mouth/Throat: Oropharynx is clear and moist. No oropharyngeal exudate.  Eyes: Conjunctivae are normal. Pupils are equal, round, and reactive to light.   Neck: Neck supple. No tracheal deviation present.  Cardiovascular: Normal rate, regular rhythm and normal heart sounds.   Pulmonary/Chest: Effort normal and breath sounds normal. No respiratory distress. She has no wheezes. She has no rales.  Abdominal: Soft.  Musculoskeletal: Normal range of motion.  Neurological: She is alert.  Skin: Skin is warm and dry.  Psychiatric: She has a normal mood and affect. Her behavior is normal.    ED Course  Procedures (including critical care time) DIAGNOSTIC STUDIES: Oxygen Saturation is 96% on RA, adequate by my interpretation.    COORDINATION OF CARE: 8:25 PM Discussed treatment plan which includes Zithromax and Ocean Spray nasal spray with pt at bedside and pt agreed to plan. Advised pt to follow up with PCP.   Labs Review Labs Reviewed - No data to display Imaging Review No results found.  EKG Interpretation   None       MDM   1. Sinus infection      I personally performed the services described in this documentation, which was scribed in my presence. The recorded information has been reviewed and is accurate.   Arman Filter, NP 01/30/14 2043

## 2014-01-30 NOTE — ED Notes (Signed)
Pt reports that she has been having nasal congestion, HA and a cough for the past week. States that she has been taking OTC Dayquil for symptoms but they have continued to progress. Pt states that the R side of her head and face hurts worse. Pt also noted to be hypertensive, 175/90, states hx of same and she has taken her medication for this today. Pt a&o x4, ambulatory to triage.

## 2014-01-30 NOTE — Discharge Instructions (Signed)

## 2014-01-31 NOTE — ED Provider Notes (Signed)
Medical screening examination/treatment/procedure(s) were performed by non-physician practitioner and as supervising physician I was immediately available for consultation/collaboration.    Natsumi Whitsitt R Marka Treloar, MD 01/31/14 2337 

## 2014-06-29 ENCOUNTER — Other Ambulatory Visit: Payer: Self-pay | Admitting: Family

## 2014-07-06 ENCOUNTER — Ambulatory Visit (INDEPENDENT_AMBULATORY_CARE_PROVIDER_SITE_OTHER): Payer: Medicare Other | Admitting: Family

## 2014-07-06 ENCOUNTER — Encounter: Payer: Self-pay | Admitting: Family

## 2014-07-06 VITALS — BP 114/60 | HR 74 | Wt 124.0 lb

## 2014-07-06 DIAGNOSIS — IMO0001 Reserved for inherently not codable concepts without codable children: Secondary | ICD-10-CM

## 2014-07-06 DIAGNOSIS — F329 Major depressive disorder, single episode, unspecified: Secondary | ICD-10-CM

## 2014-07-06 DIAGNOSIS — I10 Essential (primary) hypertension: Secondary | ICD-10-CM

## 2014-07-06 DIAGNOSIS — Z1231 Encounter for screening mammogram for malignant neoplasm of breast: Secondary | ICD-10-CM

## 2014-07-06 DIAGNOSIS — E7800 Pure hypercholesterolemia, unspecified: Secondary | ICD-10-CM

## 2014-07-06 DIAGNOSIS — F3289 Other specified depressive episodes: Secondary | ICD-10-CM

## 2014-07-06 DIAGNOSIS — F32A Depression, unspecified: Secondary | ICD-10-CM

## 2014-07-06 DIAGNOSIS — E1165 Type 2 diabetes mellitus with hyperglycemia: Principal | ICD-10-CM

## 2014-07-06 LAB — BASIC METABOLIC PANEL
BUN: 23 mg/dL (ref 6–23)
CO2: 25 mEq/L (ref 19–32)
Calcium: 9.5 mg/dL (ref 8.4–10.5)
Chloride: 96 mEq/L (ref 96–112)
Creat: 0.76 mg/dL (ref 0.50–1.10)
Glucose, Bld: 399 mg/dL — ABNORMAL HIGH (ref 70–99)
Potassium: 4.3 mEq/L (ref 3.5–5.3)
Sodium: 134 mEq/L — ABNORMAL LOW (ref 135–145)

## 2014-07-06 LAB — HEPATIC FUNCTION PANEL
ALBUMIN: 4.4 g/dL (ref 3.5–5.2)
ALT: 9 U/L (ref 0–35)
AST: 8 U/L (ref 0–37)
Alkaline Phosphatase: 60 U/L (ref 39–117)
Bilirubin, Direct: <0.1 mg/dL (ref 0.0–0.3)
TOTAL PROTEIN: 7.1 g/dL (ref 6.0–8.3)
Total Bilirubin: 0.3 mg/dL (ref 0.2–1.2)

## 2014-07-06 LAB — CBC WITH DIFFERENTIAL/PLATELET
BASOS ABS: 0 10*3/uL (ref 0.0–0.1)
BASOS PCT: 0 % (ref 0–1)
EOS ABS: 0.1 10*3/uL (ref 0.0–0.7)
Eosinophils Relative: 1 % (ref 0–5)
HEMATOCRIT: 36 % (ref 36.0–46.0)
Hemoglobin: 11.9 g/dL — ABNORMAL LOW (ref 12.0–15.0)
Lymphocytes Relative: 29 % (ref 12–46)
Lymphs Abs: 2.1 10*3/uL (ref 0.7–4.0)
MCH: 28.3 pg (ref 26.0–34.0)
MCHC: 33.1 g/dL (ref 30.0–36.0)
MCV: 85.7 fL (ref 78.0–100.0)
MONO ABS: 0.5 10*3/uL (ref 0.1–1.0)
MONOS PCT: 7 % (ref 3–12)
Neutro Abs: 4.5 10*3/uL (ref 1.7–7.7)
Neutrophils Relative %: 63 % (ref 43–77)
Platelets: 279 10*3/uL (ref 150–400)
RBC: 4.2 MIL/uL (ref 3.87–5.11)
RDW: 13.9 % (ref 11.5–15.5)
WBC: 7.1 10*3/uL (ref 4.0–10.5)

## 2014-07-06 LAB — LIPID PANEL
Cholesterol: 166 mg/dL (ref 0–200)
HDL: 38 mg/dL — ABNORMAL LOW (ref >39)
LDL Cholesterol: 74 mg/dL (ref 0–99)
TRIGLYCERIDES: 272 mg/dL — AB (ref <150)
Total CHOL/HDL Ratio: 4.4 Ratio
VLDL: 54 mg/dL — ABNORMAL HIGH (ref 0–40)

## 2014-07-06 MED ORDER — ENALAPRIL MALEATE 10 MG PO TABS: ORAL_TABLET | ORAL | Status: DC

## 2014-07-06 MED ORDER — METFORMIN HCL 500 MG PO TABS: ORAL_TABLET | ORAL | Status: DC

## 2014-07-06 MED ORDER — GLIPIZIDE 10 MG PO TABS: ORAL_TABLET | ORAL | Status: DC

## 2014-07-06 MED ORDER — RISPERIDONE 0.5 MG PO TABS: ORAL_TABLET | ORAL | Status: DC

## 2014-07-06 MED ORDER — SERTRALINE HCL 50 MG PO TABS: ORAL_TABLET | ORAL | Status: DC

## 2014-07-06 NOTE — Patient Instructions (Signed)
Diabetes and Standards of Medical Care  Diabetes is complicated. You may find that your diabetes team includes a dietitian, nurse, diabetes educator, eye doctor, and more. To help everyone know what is going on and to help you get the care you deserve, the following schedule of care was developed to help keep you on track. Below are the tests, exams, vaccines, medicines, education, and plans you will need. HbA1c test This test shows how well you have controlled your glucose over the past 2-3 months. It is used to see if your diabetes management plan needs to be adjusted.   It is performed at least 2 times a year if you are meeting treatment goals.  It is performed 4 times a year if therapy has changed or if you are not meeting treatment goals. Blood pressure test  This test is performed at every routine medical visit. The goal is less than 140/90 mmHg for most people, but 130/80 mmHg in some cases. Ask your health care provider about your goal. Dental exam  Follow up with the dentist regularly. Eye exam  If you are diagnosed with type 1 diabetes as a child, get an exam upon reaching the age of 10 years or older and have had diabetes for 3-5 years. Yearly eye exams are recommended after that initial eye exam.  If you are diagnosed with type 1 diabetes as an adult, get an exam within 5 years of diagnosis and then yearly.  If you are diagnosed with type 2 diabetes, get an exam as soon as possible after the diagnosis and then yearly. Foot care exam  Visual foot exams are performed at every routine medical visit. The exams check for cuts, injuries, or other problems with the feet.  A comprehensive foot exam should be done yearly. This includes visual inspection as well as assessing foot pulses and testing for loss of sensation.  Check your feet nightly for cuts, injuries, or other problems with your feet. Tell your health care provider if anything is not healing. Kidney function test (urine  microalbumin)  This test is performed once a year.  Type 1 diabetes: The first test is performed 5 years after diagnosis.  Type 2 diabetes: The first test is performed at the time of diagnosis.  A serum creatinine and estimated glomerular filtration rate (eGFR) test is done once a year to assess the level of chronic kidney disease (CKD), if present. Lipid profile (cholesterol, HDL, LDL, triglycerides)  Performed every 5 years for most people.  The goal for LDL is less than 100 mg/dL. If you are at high risk, the goal is less than 70 mg/dL.  The goal for HDL is 40 mg/dL-50 mg/dL for men and 50 mg/dL-60 mg/dL for women. An HDL cholesterol of 60 mg/dL or higher gives some protection against heart disease.  The goal for triglycerides is less than 150 mg/dL. Influenza vaccine, pneumococcal vaccine, and hepatitis B vaccine  The influenza vaccine is recommended yearly.  The pneumococcal vaccine is generally given once in a lifetime. However, there are some instances when another vaccination is recommended. Check with your health care provider.  The hepatitis B vaccine is also recommended for adults with diabetes. Diabetes self-management education  Education is recommended at diagnosis and ongoing as needed. Treatment plan  Your treatment plan is reviewed at every medical visit. Document Released: 10/11/2009 Document Revised: 08/16/2013 Document Reviewed: 05/16/2013 Exit[employer] Patient Information 2015 [employer]. This information is not intended to replace advice given to you by your   health care provider. Make sure you discuss any questions you have with your health care provider.

## 2014-07-06 NOTE — Progress Notes (Signed)
Subjective:    Patient ID: Jaclyn [name], female    DOB: 08/11/1956, 58 y.o.   MRN: [id number]  HPI 58 year old white female, nonsmoker with a history of type 2 diabetes, hypertension, bipolar disorder is in today for recheck. Denies any concerns. Does not routinely check her blood sugars. Last colonoscopy was in 2013 was normal. Has not had a mammogram in several years.   Review of Systems  Constitutional: Negative.   HENT: Negative.   Respiratory: Negative.   Cardiovascular: Negative.   Gastrointestinal: Negative.   Endocrine: Negative.  Negative for polydipsia, polyphagia and polyuria.  Genitourinary: Negative.   Musculoskeletal: Negative.   Skin: Negative.   Neurological: Negative.   Hematological: Negative.   Psychiatric/Behavioral: Negative.    Past Medical History  Diagnosis Date  . Diabetes mellitus without complication   . Hypertension   . Arthritis   . Depression   . Anxiety     History   Social History  . Marital Status: Single    Spouse Name: N/A    Number of Children: N/A  . Years of Education: N/A   Occupational History  . Not on file.   Social History Main Topics  . Smoking status: Never Smoker   . Smokeless tobacco: Never Used  . Alcohol Use: No  . Drug Use: No  . Sexual Activity: Not Currently   Other Topics Concern  . Not on file   Social History Narrative  . No narrative on file    Past Surgical History  Procedure Laterality Date  . Tubal ligation    . Nasal septum surgery      History reviewed. No pertinent family history.  Allergies  Allergen Reactions  . Codeine Nausea And Vomiting    States that she cannot take in high doses  . Flagyl [Metronidazole] Other (See Comments)    Fast heartbeat,tingly  . Morphine And Related Nausea And Vomiting    States that she cannot take in high doses  . Remeron [Mirtazapine] Other (See Comments)    Severe restlessness,irritable,hyperactivity,agitation    Current Outpatient Prescriptions  on File Prior to Visit  Medication Sig Dispense Refill  . sodium chloride (OCEAN) 0.65 % SOLN nasal spray Place 1 spray into both nostrils as needed for congestion.  1 Bottle  0   No current facility-administered medications on file prior to visit.    BP 114/60  Pulse 74  Wt 124 lb (56.246 kg)chart    Objective:   Physical Exam  Constitutional: She is oriented to person, place, and time. She appears well-developed and well-nourished.  HENT:  Right Ear: External ear normal.  Left Ear: External ear normal.  Nose: Nose normal.  Mouth/Throat: Oropharynx is clear and moist.  Eyes: Pupils are equal, round, and reactive to light.  Neck: Normal range of motion. Neck supple.  Cardiovascular: Normal rate, regular rhythm and normal heart sounds.   Pulmonary/Chest: Effort normal and breath sounds normal.  Musculoskeletal: Normal range of motion.  Neurological: She is alert and oriented to person, place, and time.  Skin: Skin is warm and dry.  Psychiatric: She has a normal mood and affect.          Assessment & Plan:  Jaclyn [name] was seen today for follow-up and fatigue.  Diagnoses and associated orders for this visit:  Type II or unspecified type diabetes mellitus without mention of complication, uncontrolled - Cancel: Hemoglobin A1c - Cancel: Hepatic Function Panel - Cancel: CBC with Differential - Cancel: Microalbumin/Creatinine Ratio, Urine  Unspecified  essential hypertension - Cancel: Basic Metabolic Panel  Depression  Pure hypercholesterolemia - Cancel: Lipid Panel  Screening mammogram for high-risk patient - MM Digital Screening; Future  Other Orders - sertraline (ZOLOFT) 50 MG tablet; TAKE ONE TABLET BY MOUTH ONCE DAILY FOR DEPRESSION - risperiDONE (RISPERDAL) 0.5 MG tablet; TAKE ONE TABLET BY MOUTH TWICE DAILY FOR  MOOD  CONTROL - metFORMIN (GLUCOPHAGE) 500 MG tablet; TAKE ONE TABLET BY MOUTH TWICE DAILY WITH A MEAL - glipiZIDE (GLUCOTROL) 10 MG tablet; TAKE ONE  TABLET BY MOUTH TWICE DAILY BEFORE MEAL(S) - enalapril (VASOTEC) 10 MG tablet; TAKE ONE TABLET BY MOUTH ONCE DAILY   Call the office with any questions or concerns. Recheck as scheduled and as needed.

## 2014-07-06 NOTE — Progress Notes (Signed)
Pre visit review using our clinic review tool, if applicable. No additional management support is needed unless otherwise documented below in the visit note. 

## 2014-07-07 LAB — HEMOGLOBIN A1C
HEMOGLOBIN A1C: 11.4 % — AB (ref <5.7)
MEAN PLASMA GLUCOSE: 280 mg/dL — AB (ref <117)

## 2014-07-07 LAB — MICROALBUMIN / CREATININE URINE RATIO
Creatinine, Urine: 37.9 mg/dL
Microalb Creat Ratio: 13.2 mg/g (ref 0.0–30.0)
Microalb, Ur: 0.5 mg/dL (ref 0.00–1.89)

## 2014-07-31 ENCOUNTER — Telehealth: Payer: Self-pay | Admitting: Family

## 2014-07-31 ENCOUNTER — Encounter: Payer: Self-pay | Admitting: Family

## 2014-07-31 ENCOUNTER — Ambulatory Visit (INDEPENDENT_AMBULATORY_CARE_PROVIDER_SITE_OTHER): Payer: Medicare Other | Admitting: Family

## 2014-07-31 VITALS — BP 128/74 | HR 95 | Wt 125.0 lb

## 2014-07-31 DIAGNOSIS — I10 Essential (primary) hypertension: Secondary | ICD-10-CM

## 2014-07-31 DIAGNOSIS — IMO0001 Reserved for inherently not codable concepts without codable children: Secondary | ICD-10-CM

## 2014-07-31 DIAGNOSIS — E1165 Type 2 diabetes mellitus with hyperglycemia: Principal | ICD-10-CM

## 2014-07-31 MED ORDER — METFORMIN HCL 1000 MG PO TABS: ORAL_TABLET | ORAL | Status: DC

## 2014-07-31 MED ORDER — GLUCOSE BLOOD VI STRP: ORAL_STRIP | Status: DC

## 2014-07-31 NOTE — Progress Notes (Signed)
Pre visit review using our clinic review tool, if applicable. No additional management support is needed unless otherwise documented below in the visit note. 

## 2014-07-31 NOTE — Patient Instructions (Signed)

## 2014-07-31 NOTE — Telephone Encounter (Signed)
Pt request refill of the following:   1 touch verio test strips    Phamacy:  [facility] [phone] [phone]

## 2014-07-31 NOTE — Progress Notes (Signed)
Subjective:    Patient ID: Jaclyn [name], female    DOB: July 16, 1956, 58 y.o.   MRN: [id number]  HPI  58 year old white female, nonsmoker with a history of type 2 diabetes, hypertension, bipolar disorder, depression and sense for recheck of type 2 diabetes. Hemoglobin A1c was 11.4. Currently taking metformin 500 mg twice a day and glipizide 10 mg twice a day. Is tolerating the medication well. Follows a healthy diet and exercises. Has a family history significant for type 2 diabetes.  Review of Systems  Unable to perform ROS Constitutional: Negative.   HENT: Negative.   Respiratory: Negative.   Cardiovascular: Negative.   Gastrointestinal: Negative.   Endocrine: Negative.  Negative for cold intolerance and heat intolerance.  Genitourinary: Negative.   Musculoskeletal: Negative.   Skin: Negative.   Allergic/Immunologic: Negative.   Neurological: Negative.   Psychiatric/Behavioral: Negative.    Past Medical History  Diagnosis Date  . Diabetes mellitus without complication   . Hypertension   . Arthritis   . Depression   . Anxiety     History   Social History  . Marital Status: Single    Spouse Name: N/A    Number of Children: N/A  . Years of Education: N/A   Occupational History  . Not on file.   Social History Main Topics  . Smoking status: Never Smoker   . Smokeless tobacco: Never Used  . Alcohol Use: No  . Drug Use: No  . Sexual Activity: Not Currently   Other Topics Concern  . Not on file   Social History Narrative  . No narrative on file    Past Surgical History  Procedure Laterality Date  . Tubal ligation    . Nasal septum surgery      History reviewed. No pertinent family history.  Allergies  Allergen Reactions  . Codeine Nausea And Vomiting    States that she cannot take in high doses  . Flagyl [Metronidazole] Other (See Comments)    Fast heartbeat,tingly  . Morphine And Related Nausea And Vomiting    States that she cannot take in high doses   . Remeron [Mirtazapine] Other (See Comments)    Severe restlessness,irritable,hyperactivity,agitation    Current Outpatient Prescriptions on File Prior to Visit  Medication Sig Dispense Refill  . enalapril (VASOTEC) 10 MG tablet TAKE ONE TABLET BY MOUTH ONCE DAILY  90 tablet  1  . glipiZIDE (GLUCOTROL) 10 MG tablet TAKE ONE TABLET BY MOUTH TWICE DAILY BEFORE MEAL(S)  180 tablet  1  . risperiDONE (RISPERDAL) 0.5 MG tablet TAKE ONE TABLET BY MOUTH TWICE DAILY FOR  MOOD  CONTROL  180 tablet  1  . sertraline (ZOLOFT) 50 MG tablet TAKE ONE TABLET BY MOUTH ONCE DAILY FOR DEPRESSION  90 tablet  1  . sodium chloride (OCEAN) 0.65 % SOLN nasal spray Place 1 spray into both nostrils as needed for congestion.  1 Bottle  0   No current facility-administered medications on file prior to visit.    BP 128/74  Pulse 95  Wt 125 lb (56.7 kg)chart    Objective:   Physical Exam  Constitutional: She is oriented to person, place, and time. She appears well-developed and well-nourished.  HENT:  Right Ear: External ear normal.  Left Ear: External ear normal.  Nose: Nose normal.  Mouth/Throat: Oropharynx is clear and moist.  Neck: Normal range of motion. Neck supple. No thyromegaly present.  Cardiovascular: Normal rate, regular rhythm and normal heart sounds.   Pulmonary/Chest: Effort normal  and breath sounds normal.  Abdominal: Soft. Bowel sounds are normal.  Musculoskeletal: Normal range of motion.  Neurological: She is alert and oriented to person, place, and time.  Skin: Skin is warm and dry.  Psychiatric: She has a normal mood and affect.          Assessment & Plan:  Jaclyn [name] was seen today for diabetes.  Diagnoses and associated orders for this visit:  Type II or unspecified type diabetes mellitus without mention of complication, uncontrolled  Unspecified essential hypertension  Other Orders - metFORMIN (GLUCOPHAGE) 1000 MG tablet; TAKE ONE TABLET BY MOUTH TWICE DAILY WITH A MEAL     recheck in 6 weeks. Glucometer provided today. Check blood glucose fasting and postprandially. Continue healthy diet and exercise.

## 2014-07-31 NOTE — Telephone Encounter (Signed)
Done

## 2014-08-01 MED ORDER — GLUCOSE BLOOD VI STRP: ORAL_STRIP | Status: DC

## 2014-08-01 NOTE — Telephone Encounter (Signed)
Pt states pharmacy was missing the dx codes, to fill the rx, please resend

## 2014-08-01 NOTE — Addendum Note (Signed)
Addended by: Beverely LowFRAZIER, Britanee Vanblarcom L on: 08/01/2014 03:52 PM   Modules accepted: Orders

## 2014-08-06 ENCOUNTER — Telehealth: Payer: Self-pay | Admitting: Family

## 2014-08-06 ENCOUNTER — Other Ambulatory Visit: Payer: Self-pay

## 2014-08-06 MED ORDER — GLUCOSE BLOOD VI STRP: ORAL_STRIP | Status: DC

## 2014-08-06 NOTE — Telephone Encounter (Signed)
Rx resent. Spoke with pharmacist and daughter

## 2014-08-06 NOTE — Telephone Encounter (Signed)
Pt's daughter calling to report that [facility]([place]) states they will not refill rx for glucose test strips because they do not have the dx code or the instructions.  Please call pharmacy with this information and pt's daughter also requests a callback to confirmed.

## 2014-08-17 ENCOUNTER — Emergency Department (INDEPENDENT_AMBULATORY_CARE_PROVIDER_SITE_OTHER)
Admission: EM | Admit: 2014-08-17 | Discharge: 2014-08-17 | Disposition: A | Discharge status: Home / Self Care | Payer: Medicare Other | Source: Home / Self Care | Attending: Family Medicine | Admitting: Family Medicine

## 2014-08-17 ENCOUNTER — Encounter (HOSPITAL_COMMUNITY): Payer: Self-pay | Admitting: Emergency Medicine

## 2014-08-17 DIAGNOSIS — L237 Allergic contact dermatitis due to plants, except food: Secondary | ICD-10-CM

## 2014-08-17 DIAGNOSIS — L255 Unspecified contact dermatitis due to plants, except food: Secondary | ICD-10-CM

## 2014-08-17 MED ORDER — TRIAMCINOLONE ACETONIDE 0.1 % EX CREA: 1.0000 "application " | TOPICAL_CREAM | Freq: Two times a day (BID) | CUTANEOUS | Status: DC

## 2014-08-17 NOTE — ED Notes (Signed)
Right eye swelling, redness, irritation, onset 8/19.

## 2014-08-17 NOTE — Discharge Instructions (Signed)
Thank you for coming in today. Apply triamcinolone to the neck. Use over-the-counter hydrocortisone cream very sparingly on the face. Do not get this medication in to your eyes. Come back as needed Go to the emergency room if worse  [facility] Poison ivy is a inflammation of the skin (contact dermatitis) caused by touching the allergens on the leaves of the ivy plant following previous exposure to the plant. The rash usually appears 48 hours after exposure. The rash is usually bumps (papules) or blisters (vesicles) in a linear pattern. Depending on your own sensitivity, the rash may simply cause redness and itching, or it may also progress to blisters which may break open. These must be well cared for to prevent secondary bacterial (germ) infection, followed by scarring. Keep any open areas dry, clean, dressed, and covered with an antibacterial ointment if needed. The eyes may also get puffy. The puffiness is worst in the morning and gets better as the day progresses. This dermatitis usually heals without scarring, within 2 to 3 weeks without treatment. HOME CARE INSTRUCTIONS  Thoroughly wash with soap and water as soon as you have been exposed to poison ivy. You have about one half hour to remove the plant resin before it will cause the rash. This washing will destroy the oil or antigen on the skin that is causing, or will cause, the rash. Be sure to wash under your fingernails as any plant resin there will continue to spread the rash. Do not rub skin vigorously when washing affected area. Poison ivy cannot spread if no oil from the plant remains on your body. A rash that has progressed to weeping sores will not spread the rash unless you have not washed thoroughly. It is also important to wash any clothes you have been wearing as these may carry active allergens. The rash will return if you wear the unwashed clothing, even several days later. Avoidance of the plant in the future is the best measure.  Poison ivy plant can be recognized by the number of leaves. Generally, poison ivy has three leaves with flowering branches on a single stem. Diphenhydramine may be purchased over the counter and used as needed for itching. Do not drive with this medication if it makes you drowsy.Ask your caregiver about medication for children. SEEK MEDICAL CARE IF:  Open sores develop.  Redness spreads beyond area of rash.  You notice purulent (pus-like) discharge.  You have increased pain.  Other signs of infection develop (such as fever). Document Released: 12/11/2000 Document Revised: 03/07/2012 Document Reviewed: 05/24/2009 [facility] Patient Information 2015 [place], [place]. This information is not intended to replace advice given to you by your health care provider. Make sure you discuss any questions you have with your health care provider.

## 2014-08-17 NOTE — ED Provider Notes (Signed)
Jaclyn [name] is a 58 y.o. female who presents to Urgent Care today for irritation around the eyes. Patient was exposed to poison ivy a few days ago and noticed irritation around both eyes and on the neck. Her current symptoms are consistent with prior episodes of poison ivy. She denies any significant blurry vision or pain fevers chills nausea vomiting or diarrhea. She's tried a little bit of Benadryl which helps some. She has a history of diabetes with poorly controlled blood sugars.   Past Medical History  Diagnosis Date  . Diabetes mellitus without complication   . Hypertension   . Arthritis   . Depression   . Anxiety    History  Substance Use Topics  . Smoking status: Never Smoker   . Smokeless tobacco: Never Used  . Alcohol Use: No   ROS as above Medications: No current facility-administered medications for this encounter.   Current Outpatient Prescriptions  Medication Sig Dispense Refill  . enalapril (VASOTEC) 10 MG tablet TAKE ONE TABLET BY MOUTH ONCE DAILY  90 tablet  1  . glipiZIDE (GLUCOTROL) 10 MG tablet TAKE ONE TABLET BY MOUTH TWICE DAILY BEFORE MEAL(S)  180 tablet  1  . glucose blood test strip Use to check blood sugar twice daily  100 each  12  . metFORMIN (GLUCOPHAGE) 1000 MG tablet TAKE ONE TABLET BY MOUTH TWICE DAILY WITH A MEAL  60 tablet  1  . risperiDONE (RISPERDAL) 0.5 MG tablet TAKE ONE TABLET BY MOUTH TWICE DAILY FOR  MOOD  CONTROL  180 tablet  1  . sertraline (ZOLOFT) 50 MG tablet TAKE ONE TABLET BY MOUTH ONCE DAILY FOR DEPRESSION  90 tablet  1  . sodium chloride (OCEAN) 0.65 % SOLN nasal spray Place 1 spray into both nostrils as needed for congestion.  1 Bottle  0    Exam:  BP 148/89  Pulse 70  Temp(Src) 98 F (36.7 C) (Oral)  Resp 14  SpO2 99% Gen: Well NAD HEENT: EOMI,  MMM normal-appearing eyes with normal conjunctiva. PERRLA. Lungs: Normal work of breathing. CTABL Heart: RRR no MRG Abd: NABS, Soft. Nondistended, Nontender Exts: Brisk capillary  refill, warm and well perfused.  Skin: Mild erythema on the face around the eyes and on the neck. No vesicles present. Nontender.  Visual Acuity:  Right Eye Distance: 20/40 Left Eye Distance: 20/40 Bilateral Distance: 20/40   No results found for this or any previous visit (from the past 24 hour(s)). No results found.  Assessment and Plan: 58 y.o. female with poison ivy dermatitis. Plan to treat her with triamcinolone cream on the neck and a small amount of hydrocortisone cream around the eyes. Avoid prednisone given poorly controlled blood sugar already.  Discussed warning signs or symptoms. Please see discharge instructions. Patient expresses understanding.   This note was created using Conservation officer, historic buildingsDragon voice recognition software. Any transcription errors are unintended.    Rodolph BongEvan S Anola Mcgough, MD 08/17/14 [phone]

## 2014-08-17 NOTE — ED Notes (Signed)
Patient returned to treatment room.

## 2014-09-06 ENCOUNTER — Ambulatory Visit: Payer: Medicare Other | Admitting: Family

## 2014-09-11 ENCOUNTER — Other Ambulatory Visit: Payer: Self-pay | Admitting: Family

## 2014-09-11 ENCOUNTER — Other Ambulatory Visit: Payer: Self-pay | Admitting: Physician Assistant

## 2014-09-11 ENCOUNTER — Ambulatory Visit (INDEPENDENT_AMBULATORY_CARE_PROVIDER_SITE_OTHER): Payer: Medicare Other | Admitting: Physician Assistant

## 2014-09-11 ENCOUNTER — Encounter: Payer: Self-pay | Admitting: Physician Assistant

## 2014-09-11 ENCOUNTER — Ambulatory Visit: Payer: Medicare Other | Admitting: Family

## 2014-09-11 ENCOUNTER — Telehealth: Payer: Self-pay | Admitting: Family

## 2014-09-11 VITALS — BP 128/72 | HR 60 | Temp 98.1°F | Resp 18 | Wt 123.9 lb

## 2014-09-11 DIAGNOSIS — E1165 Type 2 diabetes mellitus with hyperglycemia: Secondary | ICD-10-CM

## 2014-09-11 DIAGNOSIS — Z23 Encounter for immunization: Secondary | ICD-10-CM

## 2014-09-11 DIAGNOSIS — IMO0001 Reserved for inherently not codable concepts without codable children: Secondary | ICD-10-CM

## 2014-09-11 MED ORDER — GLIPIZIDE 10 MG PO TABS: 10.0000 mg | ORAL_TABLET | Freq: Every day | ORAL | Status: DC

## 2014-09-11 MED ORDER — INSULIN PEN NEEDLE 32G X 4 MM MISC: Status: AC

## 2014-09-11 MED ORDER — INSULIN DETEMIR 100 UNIT/ML FLEXPEN: 10.0000 [IU] | PEN_INJECTOR | Freq: Every day | SUBCUTANEOUS | Status: DC

## 2014-09-11 MED ORDER — INSULIN DETEMIR 100 UNIT/ML ~~LOC~~ SOLN: 10.0000 [IU] | Freq: Every day | SUBCUTANEOUS | Status: DC

## 2014-09-11 NOTE — Telephone Encounter (Signed)
Pharm states there is 2 different instructions on pt's rx glipiZIDE (GLUCOTROL) 10 MG tablet 90 tablet  Route: Take 1 tablet (10 mg total) by mouth daily before breakfast.  TAKE ONE TABLET BY MOUTH TWICE DAILY BEFORE MEAL(S) - Oral Which one is correct? [email or web address]

## 2014-09-11 NOTE — Progress Notes (Signed)
Subjective:    Patient ID: Jaclyn [name], female    DOB: 1956-11-23, 58 y.o.   MRN: [id number]  HPI Patient is a 58 y.o. female presenting for follow up on DM II. Pt is currently taking metformin BID and glipizide BID. 6 weeks ago, her Metformin component was doubled in an attempt to maximize therapy. Her most recent A1c was 11.4 on 07/06/2014. She has been checking her fasting AM blood sugars for the last 6 weeks and states that they have been consistently in the 230s. She states that prior to doubling her metformin, her AM fasting blood sugars were averaging in the 300s. She states that she tolerates the medications well and denies adverse effects to medication. Patient denies fevers, chills, nausea, vomiting, diarrhea, shortness of breath, chest pain, headache, syncope.   Review of Systems As per HPI and are otherwise negative.    Past Medical History  Diagnosis Date  . Diabetes mellitus without complication   . Hypertension   . Arthritis   . Depression   . Anxiety     History   Social History  . Marital Status: Single    Spouse Name: N/A    Number of Children: N/A  . Years of Education: N/A   Occupational History  . Not on file.   Social History Main Topics  . Smoking status: Never Smoker   . Smokeless tobacco: Never Used  . Alcohol Use: No  . Drug Use: No  . Sexual Activity: Not Currently   Other Topics Concern  . Not on file   Social History Narrative  . No narrative on file    Past Surgical History  Procedure Laterality Date  . Tubal ligation    . Nasal septum surgery      History reviewed. No pertinent family history.  Allergies  Allergen Reactions  . Codeine Nausea And Vomiting    States that she cannot take in high doses  . Flagyl [Metronidazole] Other (See Comments)    Fast heartbeat,tingly  . Morphine And Related Nausea And Vomiting    States that she cannot take in high doses  . Remeron [Mirtazapine] Other (See Comments)    Severe  restlessness,irritable,hyperactivity,agitation    Current Outpatient Prescriptions on File Prior to Visit  Medication Sig Dispense Refill  . enalapril (VASOTEC) 10 MG tablet TAKE ONE TABLET BY MOUTH ONCE DAILY  90 tablet  1  . glipiZIDE (GLUCOTROL) 10 MG tablet TAKE ONE TABLET BY MOUTH TWICE DAILY BEFORE MEAL(S)  180 tablet  1  . glucose blood test strip Use to check blood sugar twice daily  100 each  12  . metFORMIN (GLUCOPHAGE) 1000 MG tablet TAKE ONE TABLET BY MOUTH TWICE DAILY WITH A MEAL  60 tablet  1  . risperiDONE (RISPERDAL) 0.5 MG tablet TAKE ONE TABLET BY MOUTH TWICE DAILY FOR  MOOD  CONTROL  180 tablet  1  . sertraline (ZOLOFT) 50 MG tablet TAKE ONE TABLET BY MOUTH ONCE DAILY FOR DEPRESSION  90 tablet  1  . sodium chloride (OCEAN) 0.65 % SOLN nasal spray Place 1 spray into both nostrils as needed for congestion.  1 Bottle  0  . triamcinolone cream (KENALOG) 0.1 % Apply 1 application topically 2 (two) times daily.  30 g  0   No current facility-administered medications on file prior to visit.    EXAM: BP 128/72  Pulse 60  Temp(Src) 98.1 F (36.7 C) (Oral)  Resp 18  Wt 123 lb 14.4 oz (56.201 kg)  Objective:   Physical Exam  Nursing note and vitals reviewed. Constitutional: She is oriented to person, place, and time. She appears well-developed and well-nourished. No distress.  HENT:  Head: Normocephalic and atraumatic.  Eyes: Conjunctivae and EOM are normal.  Cardiovascular: Normal rate, regular rhythm and intact distal pulses.   Pulmonary/Chest: Effort normal and breath sounds normal. No respiratory distress. She exhibits no tenderness.  Neurological: She is alert and oriented to person, place, and time.  Skin: Skin is warm and dry. She is not diaphoretic. No pallor.  Psychiatric: She has a normal mood and affect. Her behavior is normal. Judgment and thought content normal.    Lab Results  Component Value Date   WBC 7.1 07/06/2014   HGB 11.9* 07/06/2014   HCT  36.0 07/06/2014   PLT 279 07/06/2014   GLUCOSE 399* 07/06/2014   CHOL 166 07/06/2014   TRIG 272* 07/06/2014   HDL 38* 07/06/2014   LDLCALC 74 07/06/2014   ALT 9 07/06/2014   AST 8 07/06/2014   NA 134* 07/06/2014   K 4.3 07/06/2014   CL 96 07/06/2014   CREATININE 0.76 07/06/2014   BUN 23 07/06/2014   CO2 25 07/06/2014   TSH 2.72 01/03/2014   INR 1.27 11/10/2012   HGBA1C 11.4* 07/06/2014   MICROALBUR 0.50 07/06/2014         Assessment & Plan:  Beaulah was seen today for follow up diabetes.  Diagnoses and associated orders for this visit:  Need for prophylactic vaccination and inoculation against influenza - Flu Vaccine QUAD 36+ mos PF IM (Fluarix Quad PF)  Type II or unspecified type diabetes mellitus without mention of complication, uncontrolled Comments: Continue metformin, cut glipizide in half, start levemir with auto titration. Follow up a1c in 4 weeks, f/u with PCP in 1 month. - Hemoglobin A1c; Future - Insulin Detemir (LEVEMIR FLEXTOUCH) 100 UNIT/ML Pen; Inject 10 Units into the skin daily at 10 pm.  Other Orders - insulin detemir (LEVEMIR) 100 UNIT/ML injection; Inject 0.1 mLs (10 Units total) into the skin at bedtime. - Insulin Pen Needle (BD PEN NEEDLE NANO U/F) 32G X 4 MM MISC; Test daily.    Return precautions provided, and patient handout on DM II.  Plan to follow up in about 1 month with PCP to reassess, or for worsening or persistent symptoms despite treatment.  Patient Instructions  Continue taking metformin as directed twice daily.  We will cut your glipizide in half, You will now take it once daily.  Levemir insulin at bedtime. We will start with 10 units at bedtime. You will automatically titrate this on your own: - Check your fasting blood sugar every morning. - Every three (3) days, add your 3 most recent AM blood sugars together and divide by 3 to get the average for the past 3 mornings. - Next, if the average blood sugars for the past 3 days are above 130, you  will increase your total insulin units at bedtime by 2 units. - You will do this until your average blood sugars are around 130 daily.  Example: Starting at 10 units at night Blood sugars: Monday: 220 Tuesday: 200 Wednesday: 210 Average= (220+200+210)/ 3 = (630)/3 = 210 on average ---Average above 130, so Increase 2 units to total of 12 units at night. Thurs: 180 Fri: 200 Sat: 190 Average= (180+200+190)/ 3 = (570)/3 = 190 on average ---Average above 130, so Increase 2 units to total of 14 units at night. And so on until the average  every 3 days is 130.  If emergency symptoms discussed during visit developed, seek medical attention immediately.  Followup in about 1 month to reassess, or for worsening or persistent symptoms despite treatment.

## 2014-09-11 NOTE — Telephone Encounter (Signed)
We changed this today to once daily.

## 2014-09-11 NOTE — Patient Instructions (Addendum)
Continue taking metformin as directed twice daily.  We will cut your glipizide in half, You will now take it once daily.  Levemir insulin at bedtime. We will start with 10 units at bedtime. You will automatically titrate this on your own: - Check your fasting blood sugar every morning. - Every three (3) days, add your 3 most recent AM blood sugars together and divide by 3 to get the average for the past 3 mornings. - Next, if the average blood sugars for the past 3 days are above 130, you will increase your total insulin units at bedtime by 2 units. - You will do this until your average blood sugars are around 130 daily.  Example: Starting at 10 units at night Blood sugars: Monday: 220 Tuesday: 200 Wednesday: 210 Average= (220+200+210)/ 3 = (630)/3 = 210 on average ---Average above 130, so Increase 2 units to total of 12 units at night. Thurs: 180 Fri: 200 Sat: 190 Average= (180+200+190)/ 3 = (570)/3 = 190 on average ---Average above 130, so Increase 2 units to total of 14 units at night. And so on until the average every 3 days is 130.  If emergency symptoms discussed during visit developed, seek medical attention immediately.  Followup in about 1 month to reassess, or for worsening or persistent symptoms despite treatment.      Type 2 Diabetes Mellitus Type 2 diabetes mellitus is a long-term (chronic) disease. In type 2 diabetes:  The pancreas does not make enough of a hormone called insulin.  The cells in the body do not respond as well to the insulin that is made.  Both of the above can happen. Normally, insulin moves sugars from food into tissue cells. This gives you energy. If you have type 2 diabetes, sugars cannot be moved into tissue cells. This causes high blood sugar (hyperglycemia).  HOME CARE  Have your hemoglobin A1c level checked twice a year. The level shows if your diabetes is under control or out of control.  Test your blood sugar level every day as told  by your doctor.  Check your ketone levels by testing your pee (urine) when you are sick and as told.  Take your diabetes or insulin medicine as told by your doctor.  Never run out of insulin.  Adjust how much insulin you give yourself based on how many carbs (carbohydrates) you eat. Carbs are in many foods, such as fruits, vegetables, whole grains, and dairy products.  Have a healthy snack between every healthy meal. Have 3 meals and 3 snacks a day.  Lose weight if you are overweight.  Carry a medical alert card or wear your medical alert jewelry.  Carry a 15-gram carb snack with you at all times. Examples include:  Glucose pills, 3 or 4.  Glucose gel, 15-gram tube.  Raisins, 2 tablespoons (24 grams).  Jelly beans, 6.  Animal crackers, 8.  Regular (not diet) pop, 4 ounces (120 milliliters).  Gummy treats, 9.  Notice low blood sugar (hypoglycemia) symptoms, such as:  Shaking (tremors).  Trouble thinking clearly.  Sweating.  Faster heart rate.  Headache.  Dry mouth.  Hunger.  Crabbiness (irritability).  Being worried or tense (anxious).  Restless sleep.  A change in speech or coordination.  Confusion.  Treat low blood sugar right away. If you are alert and can swallow, follow the 15:15 rule:  Take 15-20 grams of a rapid-acting glucose or carb. This includes glucose gel, glucose pills, or 4 ounces (120 milliliters) of fruit juice,  regular pop, or low-fat milk.  Check your blood sugar level 15 minutes after taking the glucose.  Take 15-20 grams more of glucose if the repeat blood sugar level is still 70 mg/dL (milligrams/deciliter) or below.  Eat a meal or snack within 1 hour of the blood sugar levels going back to normal.  Notice early symptoms of high blood sugar, such as:  Being really thirsty or drinking a lot (polydipsia).  Peeing a lot (polyuria).  Do at least 150 minutes of physical activity a week or as told.  Split the 150 minutes of  activity up during the week. Do not do 150 minutes of activity in one day.  Perform exercises, such as weight lifting, at least 2 times a week or as told.  Spend no more than 90 minutes at one time inactive.  Adjust your insulin or food intake as needed if you start a new exercise or sport.  Follow your sick-day plan when you are not able to eat or drink as usual.  Do not smoke, chew tobacco, or use electronic cigarettes.  Women who are not pregnant should drink no more than 1 drink a day. Men should drink no more than 2 drinks a day.  Only drink alcohol with food.  Ask your doctor if alcohol is safe for you.  Tell your doctor if you drink alcohol several times during the week.  See your doctor regularly.  Schedule an eye exam soon after you are told you have diabetes. Schedule exams once every year.  Check your skin and feet every day. Check for cuts, bruises, redness, nail problems, bleeding, blisters, or sores. A doctor should do a foot exam once a year.  Brush your teeth and gums twice a day. Floss once a day. Visit your dentist regularly.  Share your diabetes plan with your workplace or school.  Stay up-to-date with shots that fight against diseases (immunizations).  Learn how to deal with stress.  Get diabetes education and support as needed.  Ask your doctor for special help if:  You need help to maintain or improve how you do things on your own.  You need help to maintain or improve the quality of your life.  You have foot or hand problems.  You have trouble cleaning yourself, dressing, eating, or doing physical activity. GET HELP IF:  You are unable to eat or drink for more than 6 hours.  You feel sick to your stomach (nauseous) or throw up (vomit) for more than 6 hours.  Your blood sugar level is over 240 mg/dL.  There is a change in mental status.  You get another serious illness.  You have watery poop (diarrhea) for more than 6 hours.  You have  been sick or have had a fever for 2 or more days and are not getting better.  You have pain when you are active. GET HELP RIGHT AWAY IF:  You have trouble breathing.  Your ketone levels are higher than your doctor says they should be. MAKE SURE YOU:  Understand these instructions.  Will watch your condition.  Will get help right away if you are not doing well or get worse. Document Released: 09/22/2008 Document Revised: 04/30/2014 Document Reviewed: 07/15/2012 [facility] Patient Information 2015 [place], [place]. This information is not intended to replace advice given to you by your health care provider. Make sure you discuss any questions you have with your health care provider.

## 2014-09-12 ENCOUNTER — Ambulatory Visit: Payer: Medicare Other | Admitting: Family

## 2014-09-12 MED ORDER — GLIPIZIDE 10 MG PO TABS: 10.0000 mg | ORAL_TABLET | Freq: Every day | ORAL | Status: DC

## 2014-09-12 NOTE — Addendum Note (Signed)
Addended by: Azucena Freed on: 09/12/2014 11:06 AM   Modules accepted: Orders, Medications

## 2014-09-12 NOTE — Telephone Encounter (Signed)
New rx sent to pharmacy with new instructions.

## 2014-09-17 ENCOUNTER — Other Ambulatory Visit: Payer: Self-pay | Admitting: Family

## 2014-09-18 ENCOUNTER — Ambulatory Visit: Payer: Medicare Other | Admitting: Family

## 2014-09-24 ENCOUNTER — Telehealth: Payer: Self-pay | Admitting: Family

## 2014-09-24 NOTE — Telephone Encounter (Signed)
Patient wants to know if she can change her METFORMIN to a 90 day supply, and tablets instead of tablets.

## 2014-09-25 MED ORDER — METFORMIN HCL 500 MG PO TABS: 1000.0000 mg | ORAL_TABLET | Freq: Two times a day (BID) | ORAL | Status: DC

## 2014-09-25 NOTE — Telephone Encounter (Signed)
Rx change sent to pharmacy

## 2014-10-29 ENCOUNTER — Other Ambulatory Visit: Payer: Self-pay | Admitting: Family

## 2014-11-14 ENCOUNTER — Other Ambulatory Visit: Payer: Self-pay | Admitting: Family

## 2014-12-07 ENCOUNTER — Telehealth: Payer: Self-pay | Admitting: Family

## 2014-12-07 ENCOUNTER — Ambulatory Visit: Payer: Medicare Other | Admitting: Family

## 2014-12-07 MED ORDER — RISPERIDONE 0.5 MG PO TABS: ORAL_TABLET | ORAL | Status: DC

## 2014-12-07 NOTE — Telephone Encounter (Signed)
The Rispedal was last filled 07/06/14 for #180 with 1 refill. I'm not sure why the pharmacy is only giving 30 days at a time but it was filled for 90 days.  Refill sent, however daughter may want to contact pharmacy about the quantity being distributed

## 2014-12-07 NOTE — Telephone Encounter (Signed)
Patient's daughter states patient needs a re-fill on risperiDONE (RISPERDAL) 0.5 MG tablet.  Her daughter says pharmacy is only giving patient 30 days at a time on the medication.

## 2014-12-19 ENCOUNTER — Ambulatory Visit (INDEPENDENT_AMBULATORY_CARE_PROVIDER_SITE_OTHER): Payer: Medicare Other | Admitting: Family

## 2014-12-19 ENCOUNTER — Encounter: Payer: Self-pay | Admitting: Family

## 2014-12-19 VITALS — BP 120/64 | HR 83 | Temp 98.2°F | Wt 126.7 lb

## 2014-12-19 DIAGNOSIS — IMO0002 Reserved for concepts with insufficient information to code with codable children: Secondary | ICD-10-CM

## 2014-12-19 DIAGNOSIS — I10 Essential (primary) hypertension: Secondary | ICD-10-CM

## 2014-12-19 DIAGNOSIS — F329 Major depressive disorder, single episode, unspecified: Secondary | ICD-10-CM

## 2014-12-19 DIAGNOSIS — E1165 Type 2 diabetes mellitus with hyperglycemia: Secondary | ICD-10-CM

## 2014-12-19 DIAGNOSIS — F32A Depression, unspecified: Secondary | ICD-10-CM

## 2014-12-19 DIAGNOSIS — R6889 Other general symptoms and signs: Secondary | ICD-10-CM

## 2014-12-19 NOTE — Patient Instructions (Signed)
Diabetes and Standards of Medical Care Diabetes is complicated. You may find that your diabetes team includes a dietitian, nurse, diabetes educator, eye doctor, and more. To help everyone know what is going on and to help you get the care you deserve, the following schedule of care was developed to help keep you on track. Below are the tests, exams, vaccines, medicines, education, and plans you will need. HbA1c test This test shows how well you have controlled your glucose over the past 2-3 months. It is used to see if your diabetes management plan needs to be adjusted.   It is performed at least 2 times a year if you are meeting treatment goals.  It is performed 4 times a year if therapy has changed or if you are not meeting treatment goals. Blood pressure test  This test is performed at every routine medical visit. The goal is less than 140/90 mm Hg for most people, but 130/80 mm Hg in some cases. Ask your health care provider about your goal. Dental exam  Follow up with the dentist regularly. Eye exam  If you are diagnosed with type 1 diabetes as a child, get an exam upon reaching the age of 37 years or older and have had diabetes for 3-5 years. Yearly eye exams are recommended after that initial eye exam.  If you are diagnosed with type 1 diabetes as an adult, get an exam within 5 years of diagnosis and then yearly.  If you are diagnosed with type 2 diabetes, get an exam as soon as possible after the diagnosis and then yearly. Foot care exam  Visual foot exams are performed at every routine medical visit. The exams check for cuts, injuries, or other problems with the feet.  A comprehensive foot exam should be done yearly. This includes visual inspection as well as assessing foot pulses and testing for loss of sensation.  Check your feet nightly for cuts, injuries, or other problems with your feet. Tell your health care provider if anything is not healing. Kidney function test (urine  microalbumin)  This test is performed once a year.  Type 1 diabetes: The first test is performed 5 years after diagnosis.  Type 2 diabetes: The first test is performed at the time of diagnosis.  A serum creatinine and estimated glomerular filtration rate (eGFR) test is done once a year to assess the level of chronic kidney disease (CKD), if present. Lipid profile (cholesterol, HDL, LDL, triglycerides)  Performed every 5 years for most people.  The goal for LDL is less than 100 mg/dL. If you are at high risk, the goal is less than 70 mg/dL.  The goal for HDL is 40 mg/dL-50 mg/dL for men and 50 mg/dL-60 mg/dL for women. An HDL cholesterol of 60 mg/dL or higher gives some protection against heart disease.  The goal for triglycerides is less than 150 mg/dL. Influenza vaccine, pneumococcal vaccine, and hepatitis B vaccine  The influenza vaccine is recommended yearly.  It is recommended that people with diabetes who are over 24 years old get the pneumonia vaccine. In some cases, two separate shots may be given. Ask your health care provider if your pneumonia vaccination is up to date.  The hepatitis B vaccine is also recommended for adults with diabetes. Diabetes self-management education  Education is recommended at diagnosis and ongoing as needed. Treatment plan  Your treatment plan is reviewed at every medical visit. Document Released: 10/11/2009 Document Revised: 04/30/2014 Document Reviewed: 05/16/2013 [facility] Patient Information 2015 [place],  [employer]. This information is not intended to replace advice given to you by your health care provider. Make sure you discuss any questions you have with your health care provider.  

## 2014-12-20 LAB — CBC WITH DIFFERENTIAL/PLATELET
BASOS ABS: 0.1 10*3/uL (ref 0.0–0.1)
Basophils Relative: 1.5 % (ref 0.0–3.0)
EOS ABS: 0.1 10*3/uL (ref 0.0–0.7)
Eosinophils Relative: 1.3 % (ref 0.0–5.0)
HCT: 35.8 % — ABNORMAL LOW (ref 36.0–46.0)
HEMOGLOBIN: 11.7 g/dL — AB (ref 12.0–15.0)
LYMPHS PCT: 25.8 % (ref 12.0–46.0)
Lymphs Abs: 2.2 10*3/uL (ref 0.7–4.0)
MCHC: 32.6 g/dL (ref 30.0–36.0)
MCV: 87.5 fl (ref 78.0–100.0)
Monocytes Absolute: 0.5 10*3/uL (ref 0.1–1.0)
Monocytes Relative: 5.9 % (ref 3.0–12.0)
NEUTROS ABS: 5.6 10*3/uL (ref 1.4–7.7)
Neutrophils Relative %: 65.5 % (ref 43.0–77.0)
Platelets: 285 10*3/uL (ref 150.0–400.0)
RBC: 4.09 Mil/uL (ref 3.87–5.11)
RDW: 14 % (ref 11.5–15.5)
WBC: 8.5 10*3/uL (ref 4.0–10.5)

## 2014-12-20 LAB — HEPATIC FUNCTION PANEL
ALK PHOS: 55 U/L (ref 39–117)
ALT: 17 U/L (ref 0–35)
AST: 15 U/L (ref 0–37)
Albumin: 4.1 g/dL (ref 3.5–5.2)
BILIRUBIN DIRECT: 0 mg/dL (ref 0.0–0.3)
TOTAL PROTEIN: 7.3 g/dL (ref 6.0–8.3)
Total Bilirubin: 0.3 mg/dL (ref 0.2–1.2)

## 2014-12-20 LAB — BASIC METABOLIC PANEL
BUN: 16 mg/dL (ref 6–23)
CHLORIDE: 101 meq/L (ref 96–112)
CO2: 26 mEq/L (ref 19–32)
Calcium: 9.8 mg/dL (ref 8.4–10.5)
Creatinine, Ser: 0.6 mg/dL (ref 0.4–1.2)
GFR: 121.33 mL/min (ref >60.00)
Glucose, Bld: 213 mg/dL — ABNORMAL HIGH (ref 70–99)
POTASSIUM: 4.2 meq/L (ref 3.5–5.1)
Sodium: 138 mEq/L (ref 135–145)

## 2014-12-20 LAB — HEMOGLOBIN A1C: HEMOGLOBIN A1C: 7.6 % — AB (ref 4.6–6.5)

## 2014-12-20 LAB — TSH: TSH: 1.12 u[IU]/mL (ref 0.35–4.50)

## 2014-12-20 NOTE — Progress Notes (Signed)
Subjective:    Patient ID: Jaclyn JunkerJanie Burgner, female    DOB: 01/18/1956, 58 y.o.   MRN: [id number]  HPI 58 year old white female, nonsmoker with a history of type 2 diabetes, hypertension, depression, bipolar disorder is in today for recheck. Reports doing well. Blood glucoses range between 120-210. Typical fasting blood sugar readings are 120-140. Does not routinely exercise. Is currently taking 18 units of Levemir twice a day and tolerating it well. Has not had a diabetic eye exam.   Review of Systems  Constitutional: Negative.   HENT: Negative.   Respiratory: Negative.   Cardiovascular: Negative.   Gastrointestinal: Negative.   Endocrine: Negative.   Genitourinary: Negative.   Musculoskeletal: Negative.   Skin: Negative.   Allergic/Immunologic: Negative.   Neurological: Negative.   Hematological: Negative.   Psychiatric/Behavioral: Negative.   All other systems reviewed and are negative.  Past Medical History  Diagnosis Date  . Diabetes mellitus without complication   . Hypertension   . Arthritis   . Depression   . Anxiety     History   Social History  . Marital Status: Single    Spouse Name: N/A    Number of Children: N/A  . Years of Education: N/A   Occupational History  . Not on file.   Social History Main Topics  . Smoking status: Never Smoker   . Smokeless tobacco: Never Used  . Alcohol Use: No  . Drug Use: No  . Sexual Activity: Not Currently   Other Topics Concern  . Not on file   Social History Narrative    Past Surgical History  Procedure Laterality Date  . Tubal ligation    . Nasal septum surgery      History reviewed. No pertinent family history.  Allergies  Allergen Reactions  . Codeine Nausea And Vomiting    States that she cannot take in high doses  . Flagyl [Metronidazole] Other (See Comments)    Fast heartbeat,tingly  . Morphine And Related Nausea And Vomiting    States that she cannot take in high doses  . Remeron [Mirtazapine]  Other (See Comments)    Severe restlessness,irritable,hyperactivity,agitation    Current Outpatient Prescriptions on File Prior to Visit  Medication Sig Dispense Refill  . enalapril (VASOTEC) 10 MG tablet TAKE ONE TABLET BY MOUTH ONCE DAILY 90 tablet 0  . glipiZIDE (GLUCOTROL) 10 MG tablet Take 1 tablet (10 mg total) by mouth daily. 90 tablet 1  . glucose blood test strip Use to check blood sugar twice daily 100 each 12  . Insulin Detemir (LEVEMIR FLEXTOUCH) 100 UNIT/ML Pen Inject 10 Units into the skin daily at 10 pm. 15 mL 5  . insulin detemir (LEVEMIR) 100 UNIT/ML injection Inject 0.1 mLs (10 Units total) into the skin at bedtime. 3 mL 0  . Insulin Pen Needle (BD PEN NEEDLE NANO U/F) 32G X 4 MM MISC Test daily. 100 each 5  . metFORMIN (GLUCOPHAGE) 1000 MG tablet TAKE ONE TABLET BY MOUTH TWICE DAILY WITH  A  MEAL 180 tablet 0  . risperiDONE (RISPERDAL) 0.5 MG tablet TAKE ONE TABLET BY MOUTH TWICE DAILY FOR  MOOD  CONTROL 180 tablet 0  . sertraline (ZOLOFT) 50 MG tablet TAKE ONE TABLET BY MOUTH ONCE DAILY FOR DEPRESSION 90 tablet 1   No current facility-administered medications on file prior to visit.    BP 120/64 mmHg  Pulse 83  Temp(Src) 98.2 F (36.8 C) (Oral)  Wt 126 lb 11.2 oz (57.471 kg)chart  Objective:   Physical Exam  Constitutional: She is oriented to person, place, and time. She appears well-developed and well-nourished.  HENT:  Right Ear: External ear normal.  Left Ear: External ear normal.  Nose: Nose normal.  Mouth/Throat: Oropharynx is clear and moist.  Neck: Normal range of motion. Neck supple. No thyromegaly present.  Cardiovascular: Normal rate, regular rhythm and normal heart sounds.   Pulmonary/Chest: Effort normal and breath sounds normal.  Abdominal: Soft. Bowel sounds are normal.  Musculoskeletal: Normal range of motion.  Neurological: She is alert and oriented to person, place, and time.  Skin: Skin is warm and dry.          Assessment & Plan:    [name] was seen today for diabetes.  Diagnoses and associated orders for this visit:  Diabetes type 2, uncontrolled - Hemoglobin A1c - Basic Metabolic Panel - Hepatic Function Panel - CBC with Differential - TSH  Essential hypertension - Hemoglobin A1c - Basic Metabolic Panel - Hepatic Function Panel - CBC with Differential  Depression - Hemoglobin A1c - Basic Metabolic Panel - Hepatic Function Panel - CBC with Differential - TSH  Heat intolerance - TSH    Advised diabetic eye exam. Return for well care exam. Continue current medications. Labs obtained today will recheck pending. Nightly she checks.

## 2015-01-17 ENCOUNTER — Encounter: Payer: Self-pay | Admitting: Family

## 2015-02-27 ENCOUNTER — Other Ambulatory Visit: Payer: Self-pay

## 2015-02-27 MED ORDER — GLUCOSE BLOOD VI STRP: ORAL_STRIP | Status: AC

## 2015-03-01 ENCOUNTER — Other Ambulatory Visit: Payer: Self-pay | Admitting: *Deleted

## 2015-03-01 MED ORDER — METFORMIN HCL 1000 MG PO TABS: ORAL_TABLET | ORAL | Status: DC

## 2015-03-01 MED ORDER — RISPERIDONE 0.5 MG PO TABS: ORAL_TABLET | ORAL | Status: DC

## 2015-03-01 MED ORDER — ENALAPRIL MALEATE 10 MG PO TABS: 10.0000 mg | ORAL_TABLET | Freq: Every day | ORAL | Status: DC

## 2015-04-04 ENCOUNTER — Ambulatory Visit (INDEPENDENT_AMBULATORY_CARE_PROVIDER_SITE_OTHER): Payer: Medicare Other | Admitting: Family Medicine

## 2015-04-04 ENCOUNTER — Encounter: Payer: Self-pay | Admitting: Family Medicine

## 2015-04-04 VITALS — BP 130/72 | HR 81 | Temp 98.0°F | Ht 65.75 in | Wt 126.6 lb

## 2015-04-04 DIAGNOSIS — Z7689 Persons encountering health services in other specified circumstances: Secondary | ICD-10-CM

## 2015-04-04 DIAGNOSIS — F329 Major depressive disorder, single episode, unspecified: Secondary | ICD-10-CM | POA: Diagnosis not present

## 2015-04-04 DIAGNOSIS — E119 Type 2 diabetes mellitus without complications: Secondary | ICD-10-CM | POA: Diagnosis not present

## 2015-04-04 DIAGNOSIS — I10 Essential (primary) hypertension: Secondary | ICD-10-CM | POA: Diagnosis not present

## 2015-04-04 DIAGNOSIS — F32A Depression, unspecified: Secondary | ICD-10-CM

## 2015-04-04 DIAGNOSIS — Z7189 Other specified counseling: Secondary | ICD-10-CM

## 2015-04-04 NOTE — Progress Notes (Addendum)
HPI:  Jaclyn [name] is here to establish care. She reports has not had a physical in a long time.  Has the following chronic problems that require follow up and concerns today:  Depression/Anxiety/Bipolar Disorder: -hx of suicidal attempts in 2012, 2013 and 2014 by drug overdose -she was seeing a psychiatrist in the past but lost her insurance, now has insurance again -medications:risperdal and zoloft -denies depression, manic thoughts, thoughts of self harm  Diabetes Mellitis -meds: metformin  bid, levemir 18 nightly, glipizide 10 daily -home BS: 140-200 the last few weeks fasting -on acei -denies: hypoglycemia, wounds on feet, vision -last eye exam: not done  HTN: -meds: enalapril  Ear wax: -reports chronic -daughter wants her to see an ENT doctor  ROS negative for unless reported above: fevers, unintentional weight loss, hearing or vision loss, chest pain, palpitations, struggling to breath, hemoptysis, melena, hematochezia, hematuria, falls, loc, si, thoughts of self harm  Past Medical History  Diagnosis Date  . Diabetes mellitus without complication   . Hypertension   . Arthritis   . Depression   . Anxiety     Past Surgical History  Procedure Laterality Date  . Tubal ligation    . Nasal septum surgery      No family history on file.  History   Social History  . Marital Status: Single    Spouse Name: N/A  . Number of Children: N/A  . Years of Education: N/A   Social History Main Topics  . Smoking status: Never Smoker   . Smokeless tobacco: Never Used  . Alcohol Use: No  . Drug Use: No  . Sexual Activity: Not Currently   Other Topics Concern  . None   Social History Narrative     Current outpatient prescriptions:  .  enalapril (VASOTEC) 10 MG tablet, Take 1 tablet (10 mg total) by mouth daily., Disp: 90 tablet, Rfl: 0 .  glipiZIDE (GLUCOTROL) 10 MG tablet, Take 1 tablet (10 mg total) by mouth daily., Disp: 90 tablet, Rfl: 1 .  glucose  blood test strip, Use to check blood sugar twice daily (Patient taking differently: One touch verio--Use to check blood sugar twice daily), Disp: 100 each, Rfl: 5 .  Insulin Detemir (LEVEMIR FLEXTOUCH) 100 UNIT/ML Pen, Inject 10 Units into the skin daily at 10 pm. (Patient taking differently: Inject 18 Units into the skin daily at 10 pm. ), Disp: 15 mL, Rfl: 5 .  insulin detemir (LEVEMIR) 100 UNIT/ML injection, Inject 0.1 mLs (10 Units total) into the skin at bedtime., Disp: 3 mL, Rfl: 0 .  Insulin Pen Needle (BD PEN NEEDLE NANO U/F) 32G X 4 MM MISC, Test daily., Disp: 100 each, Rfl: 5 .  metFORMIN (GLUCOPHAGE) 1000 MG tablet, TAKE ONE TABLET BY MOUTH TWICE DAILY WITH  A  MEAL (Patient taking differently: 500 mg. TAKE ONE TABLET BY MOUTH TWICE DAILY WITH  A  MEAL), Disp: 180 tablet, Rfl: 0 .  risperiDONE (RISPERDAL) 0.5 MG tablet, TAKE ONE TABLET BY MOUTH TWICE DAILY FOR  MOOD  CONTROL, Disp: 180 tablet, Rfl: 0 .  sertraline (ZOLOFT) 50 MG tablet, TAKE ONE TABLET BY MOUTH ONCE DAILY FOR DEPRESSION, Disp: 90 tablet, Rfl: 1  EXAM:  Filed Vitals:   04/04/15 1535  BP: 130/72  Pulse: 81  Temp: 98 F (36.7 C)    Body mass index is 20.59 kg/(m^2).  GENERAL: vitals reviewed and listed above, alert, oriented, appears well hydrated and in no acute distress  HEENT: atraumatic, conjunttiva  clear, no obvious abnormalities on inspection of external nose and ears  NECK: no obvious masses on inspection  LUNGS: clear to auscultation bilaterally, no wheezes, rales or rhonchi, good air movement  CV: HRRR, no peripheral edema  MS: moves all extremities without noticeable abnormality  PSYCH: pleasant and cooperative, no obvious depression or anxiety  FOOT EXAM: see chart  ASSESSMENT AND PLAN:  Discussed the following assessment and plan:  Encounter to establish care  Type 2 diabetes mellitus without complication - Plan: Hemoglobin A1c, Basic metabolic panel, Microalbumin/Creatinine Ratio,  Urine  Essential hypertension - Plan: Basic metabolic panel  Depression - Plan: Hepatic Function Panel -We reviewed the PMH, PSH, FH, SH, Meds and Allergies. -We provided refills for any medications we will prescribe as needed. -We addressed current concerns per orders and patient instructions. -We have asked for records for pertinent exams, studies, vaccines and notes from previous providers. -We have advised patient to follow up per instructions below.   -Patient advised to return or notify a doctor immediately if symptoms worsen or persist or new concerns arise.  Patient Instructions  BEFORE YOU LEAVE: -labs -physical in abut 3 months - come fasting, drink plenty of water  Increase your insulin to 20 units daily and check fasting blood sugar for a goal of fasting blood sugar under 130 on average  Call if any low blood sugars under 70 - eat a small snack or drink 4 oz of sweetened beverage and recheck 30 minute later  Schedule optholmology appointment  Schedule appointment with a psychiatrist for management of your depression  Schedule mammogram  Amlactin foot cream on the feet nightly  We recommend the following healthy lifestyle measures: - eat a healthy diet consisting of lots of vegetables, fruits, beans, nuts, seeds, healthy meats such as white chicken and fish and whole grains.  - avoid fried foods, fast food, processed foods, sodas, red meet and other fattening foods.  - get a least 150 minutes of aerobic exercise per week.          Kriste BasqueKIM, Jaclyn Montelongo R.

## 2015-04-04 NOTE — Patient Instructions (Addendum)
BEFORE YOU LEAVE: -labs -physical in abut 3 months - come fasting, drink plenty of water  Increase your insulin to 20 units daily and check fasting blood sugar for a goal of fasting blood sugar under 130 on average  Call if any low blood sugars under 70 - eat a small snack or drink 4 oz of sweetened beverage and recheck 30 minute later  Schedule optholmology appointment  Schedule appointment with a psychiatrist for management of your depression  Schedule mammogram  Amlactin foot cream on the feet nightly  We recommend the following healthy lifestyle measures: - eat a healthy diet consisting of lots of vegetables, fruits, beans, nuts, seeds, healthy meats such as white chicken and fish and whole grains.  - avoid fried foods, fast food, processed foods, sodas, red meet and other fattening foods.  - get a least 150 minutes of aerobic exercise per week.

## 2015-04-04 NOTE — Progress Notes (Signed)
Pre visit review using our clinic review tool, if applicable. No additional management support is needed unless otherwise documented below in the visit note. 

## 2015-04-05 LAB — HEPATIC FUNCTION PANEL
ALT: 14 U/L (ref 0–35)
AST: 11 U/L (ref 0–37)
Albumin: 4.2 g/dL (ref 3.5–5.2)
Alkaline Phosphatase: 57 U/L (ref 39–117)
BILIRUBIN TOTAL: 0.2 mg/dL (ref 0.2–1.2)
Bilirubin, Direct: 0.1 mg/dL (ref 0.0–0.3)
Total Protein: 7.3 g/dL (ref 6.0–8.3)

## 2015-04-05 LAB — BASIC METABOLIC PANEL
BUN: 11 mg/dL (ref 6–23)
CO2: 30 mEq/L (ref 19–32)
CREATININE: 0.56 mg/dL (ref 0.40–1.20)
Calcium: 9.9 mg/dL (ref 8.4–10.5)
Chloride: 101 mEq/L (ref 96–112)
GFR: 118.71 mL/min (ref >60.00)
GLUCOSE: 174 mg/dL — AB (ref 70–99)
POTASSIUM: 4 meq/L (ref 3.5–5.1)
Sodium: 139 mEq/L (ref 135–145)

## 2015-04-05 LAB — MICROALBUMIN / CREATININE URINE RATIO
CREATININE, U: 187.2 mg/dL
MICROALB UR: 1.2 mg/dL (ref 0.0–1.9)
Microalb Creat Ratio: 0.6 mg/g (ref 0.0–30.0)

## 2015-04-05 LAB — HEMOGLOBIN A1C: Hgb A1c MFr Bld: 7.3 % — ABNORMAL HIGH (ref 4.6–6.5)

## 2015-04-06 ENCOUNTER — Other Ambulatory Visit: Payer: Self-pay | Admitting: Family Medicine

## 2015-04-06 MED ORDER — METFORMIN HCL 1000 MG PO TABS: ORAL_TABLET | ORAL | Status: AC

## 2015-04-06 MED ORDER — ENALAPRIL MALEATE 10 MG PO TABS: 10.0000 mg | ORAL_TABLET | Freq: Every day | ORAL | Status: DC

## 2015-04-06 MED ORDER — GLIPIZIDE 10 MG PO TABS: 10.0000 mg | ORAL_TABLET | Freq: Every day | ORAL | Status: DC

## 2015-04-06 MED ORDER — INSULIN DETEMIR 100 UNIT/ML FLEXPEN: 18.0000 [IU] | PEN_INJECTOR | Freq: Every day | SUBCUTANEOUS | Status: DC

## 2015-05-17 ENCOUNTER — Other Ambulatory Visit: Payer: Self-pay | Admitting: Family Medicine

## 2015-05-17 DIAGNOSIS — Z1239 Encounter for other screening for malignant neoplasm of breast: Secondary | ICD-10-CM

## 2015-05-22 ENCOUNTER — Other Ambulatory Visit: Payer: Self-pay | Admitting: Family

## 2015-05-24 NOTE — Telephone Encounter (Signed)
Pt will establish with Dr.Kim 7.29.16

## 2015-06-04 ENCOUNTER — Telehealth: Payer: Self-pay | Admitting: Family Medicine

## 2015-06-04 NOTE — Telephone Encounter (Signed)
Patients daughter informed of the message below and states she will call for an appt and call our office back.

## 2015-06-04 NOTE — Telephone Encounter (Signed)
Please advise Jaclyn [name], that given the severity of her depression, she needs to be under the care of a psychiatrist. Once they can confirm they have scheduled an psychiatry office visit and provide us the name of the doctor they will be seeing. We can confirm the psych appt and provide refills to get to that appt only. Please provide them [facility] information for any urgent needs. Thank you.

## 2015-06-04 NOTE — Telephone Encounter (Signed)
Patient's daughter called requesting to schedule an appointment with Padonda.  Daughter started off conversation stating she was the patient and needed an appointment with Padonda.  In the call she stated that she needed a medication re-fill and Padonda always prescribed it but Dr. Selena BattenKim does not.   I realized that I wasn't talking to the patient and asked for clarification on who I was speaking to, which she admitted her daughter.  I advised her not to call pretending she is the patient and to be honest with who she is when she calls.  Jaclyn [name] verbalized understanding.   Jaclyn [name] states patient has been unsuccessful in finding a psychiatrist and wants an appointment with Padonda to have the Zoloft and risperidone filled.  I didn't feel this was appropriate and wanted to find out from Dr. Selena BattenKim what to do.  Please advise.

## 2015-07-04 ENCOUNTER — Telehealth: Payer: Self-pay | Admitting: *Deleted

## 2015-07-04 MED ORDER — INSULIN DETEMIR 100 UNIT/ML FLEXPEN: 28.0000 [IU] | PEN_INJECTOR | Freq: Every day | SUBCUTANEOUS | Status: AC

## 2015-07-04 NOTE — Telephone Encounter (Signed)
Please refill x 1 month to get to appointment. Thanks.

## 2015-07-04 NOTE — Telephone Encounter (Signed)
Patient is requesting a refill of Insulin Detemir (LEVEMIR FLEXTOUCH) 100 UNIT/ML Pen.  She states that she is now taking 28 units. [facility] [address] [phone]

## 2015-07-26 ENCOUNTER — Ambulatory Visit (INDEPENDENT_AMBULATORY_CARE_PROVIDER_SITE_OTHER): Payer: Medicare Other | Admitting: Family Medicine

## 2015-07-26 DIAGNOSIS — R69 Illness, unspecified: Secondary | ICD-10-CM

## 2015-07-26 NOTE — Progress Notes (Signed)
NO SHOW for CPE  

## 2016-02-21 DIAGNOSIS — E119 Type 2 diabetes mellitus without complications: Secondary | ICD-10-CM | POA: Diagnosis not present

## 2016-02-21 DIAGNOSIS — M7581 Other shoulder lesions, right shoulder: Secondary | ICD-10-CM | POA: Diagnosis not present

## 2016-02-21 DIAGNOSIS — I10 Essential (primary) hypertension: Secondary | ICD-10-CM | POA: Diagnosis not present

## 2016-06-06 ENCOUNTER — Telehealth: Payer: Self-pay

## 2016-06-06 NOTE — Telephone Encounter (Signed)
Patient is on the list for [employer] 2017 and may be a good candidate for an AWV in 2017. Please let me know if/when appt is scheduled.   -I do not know if pt will respond to contact efforts.

## 2016-06-15 DIAGNOSIS — M7501 Adhesive capsulitis of right shoulder: Secondary | ICD-10-CM | POA: Diagnosis not present

## 2016-06-15 DIAGNOSIS — M25511 Pain in right shoulder: Secondary | ICD-10-CM | POA: Diagnosis not present

## 2016-07-30 ENCOUNTER — Telehealth: Payer: Self-pay | Admitting: *Deleted

## 2016-07-30 NOTE — Telephone Encounter (Signed)
I left a detailed message for the pts daughter Jaclyn [name] that Dr Selena Batten received a message stating the pt is due for a mammogram and to [facility] at [phone] to schedule this.

## 2016-07-30 NOTE — Telephone Encounter (Signed)
-----   Message from Terressa Koyanagi, DO sent at 07/27/2016  8:08 AM EDT ----- Can you check to see if these folks need their mammograms? Reorder if needed? Thanks!  ----- Message ----- From: SYSTEM Sent: 07/21/2016  12:07 AM To: Terressa Koyanagi, DO

## 2016-08-25 DIAGNOSIS — M7501 Adhesive capsulitis of right shoulder: Secondary | ICD-10-CM | POA: Diagnosis not present

## 2016-09-25 DIAGNOSIS — M7501 Adhesive capsulitis of right shoulder: Secondary | ICD-10-CM | POA: Diagnosis not present

## 2016-10-21 DIAGNOSIS — Z7984 Long term (current) use of oral hypoglycemic drugs: Secondary | ICD-10-CM | POA: Diagnosis not present

## 2016-10-21 DIAGNOSIS — E119 Type 2 diabetes mellitus without complications: Secondary | ICD-10-CM | POA: Diagnosis not present

## 2016-10-21 DIAGNOSIS — Z23 Encounter for immunization: Secondary | ICD-10-CM | POA: Diagnosis not present

## 2016-10-21 DIAGNOSIS — I10 Essential (primary) hypertension: Secondary | ICD-10-CM | POA: Diagnosis not present

## 2017-05-06 DIAGNOSIS — M25561 Pain in right knee: Secondary | ICD-10-CM | POA: Diagnosis not present

## 2017-05-06 DIAGNOSIS — M222X1 Patellofemoral disorders, right knee: Secondary | ICD-10-CM | POA: Diagnosis not present

## 2017-05-06 DIAGNOSIS — M654 Radial styloid tenosynovitis [de Quervain]: Secondary | ICD-10-CM | POA: Diagnosis not present

## 2017-06-11 ENCOUNTER — Encounter (HOSPITAL_COMMUNITY): Payer: Self-pay | Admitting: Nurse Practitioner

## 2017-06-11 DIAGNOSIS — Z794 Long term (current) use of insulin: Secondary | ICD-10-CM | POA: Insufficient documentation

## 2017-06-11 DIAGNOSIS — Z7984 Long term (current) use of oral hypoglycemic drugs: Secondary | ICD-10-CM | POA: Insufficient documentation

## 2017-06-11 DIAGNOSIS — F419 Anxiety disorder, unspecified: Secondary | ICD-10-CM | POA: Insufficient documentation

## 2017-06-11 DIAGNOSIS — E119 Type 2 diabetes mellitus without complications: Secondary | ICD-10-CM | POA: Insufficient documentation

## 2017-06-11 DIAGNOSIS — Z79899 Other long term (current) drug therapy: Secondary | ICD-10-CM | POA: Diagnosis not present

## 2017-06-11 DIAGNOSIS — F5101 Primary insomnia: Secondary | ICD-10-CM | POA: Diagnosis not present

## 2017-06-11 DIAGNOSIS — I10 Essential (primary) hypertension: Secondary | ICD-10-CM | POA: Diagnosis not present

## 2017-06-11 NOTE — ED Triage Notes (Signed)
Pt state she has "not slept all week." Today, she adds, she is feeling overly anxious without anything she trigger she can attribute this to. She denies thoughts of self harm, suicidal or homicidal thoughts.

## 2017-06-12 ENCOUNTER — Emergency Department (HOSPITAL_COMMUNITY)
Admission: EM | Admit: 2017-06-12 | Discharge: 2017-06-12 | Disposition: A | Discharge status: Home / Self Care | Payer: Medicare Other | Attending: Emergency Medicine | Admitting: Emergency Medicine

## 2017-06-12 DIAGNOSIS — F419 Anxiety disorder, unspecified: Secondary | ICD-10-CM

## 2017-06-12 MED ORDER — LORAZEPAM 1 MG PO TABS
1.0000 mg | ORAL_TABLET | Freq: Once | ORAL | Status: AC
  Administered 2017-06-12: 1 mg via ORAL
  Filled 2017-06-12: qty 1

## 2017-06-12 MED ORDER — LORAZEPAM 1 MG PO TABS: 1.0000 mg | ORAL_TABLET | Freq: Three times a day (TID) | ORAL | 0 refills | Status: DC | PRN

## 2017-06-12 NOTE — ED Provider Notes (Signed)
[facility]-EMERGENCY DEPT Provider Note   CSN: [id number] Arrival date & time: 06/11/17  2245     History   Chief Complaint Chief Complaint  Patient presents with  . Anxiety    HPI Jaclyn [name] is a 61 y.o. female.  Patient here with daughter, whom she lives with, and has symptoms of restlessness, agitation and hyperactivity she relates as uncontrolled anxiety. She takes Risperdal and Buspar for anxiety but is still having difficulty sleeping, wakes from sleep suddenly and feels panicked. Daughter reports she is getting very little sleep. The patient denies hallucinations, or any suicidal or homicidal ideation.    The history is provided by the patient. No language interpreter was used.    Past Medical History:  Diagnosis Date  . Anxiety   . Arthritis   . Depression   . Diabetes mellitus without complication (HCC)   . Hypertension     Patient Active Problem List   Diagnosis Date Noted  . Bipolar depression (HCC) 07/07/2013  . Delirium, drug-induced (HCC) 11/10/2012  . Drug overdose, multiple drugs 11/09/2012  . Suicidal behavior 11/09/2012  . DM type 2 (diabetes mellitus, type 2) (HCC) 11/09/2012  . Abnormal EKG 11/09/2012  . 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 Hypertension 11/09/2012  . Depression     Past Surgical History:  Procedure Laterality Date  . NASAL SEPTUM SURGERY    . TUBAL LIGATION      OB History    No data available       Home Medications    Prior to Admission medications   Medication Sig Start Date End Date Taking? Authorizing Provider  enalapril (VASOTEC) 10 MG tablet Take 1 tablet (10 mg total) by mouth daily. 04/06/15   Terressa Koyanagi, DO  glipiZIDE (GLUCOTROL) 10 MG tablet Take 1 tablet (10 mg total) by mouth daily. 04/06/15   Kriste Basque R, DO  glucose blood test strip Use to check blood sugar twice daily Patient taking differently: One touch verio--Use to check blood sugar twice daily 02/27/15   Worthy Rancher B, FNP  Insulin Detemir  (LEVEMIR FLEXTOUCH) 100 UNIT/ML Pen Inject 28 Units into the skin daily at 10 pm. 07/04/15   Terressa Koyanagi, DO  Insulin Pen Needle (BD PEN NEEDLE NANO U/F) 32G X 4 MM MISC Test daily. 09/11/14   Toniann Ket, PA-C  metFORMIN (GLUCOPHAGE) 1000 MG tablet TAKE ONE TABLET BY MOUTH TWICE DAILY WITH  A  MEAL 04/06/15   Kriste Basque R, DO  risperiDONE (RISPERDAL) 0.5 MG tablet TAKE ONE TABLET BY MOUTH TWICE DAILY FOR MOOD  CONTROL. 05/24/15   Worthy Rancher B, FNP  sertraline (ZOLOFT) 50 MG tablet TAKE ONE TABLET BY MOUTH ONCE DAILY FOR DEPRESSION 05/24/15   Eulis Foster, FNP    Family History History reviewed. No pertinent family history.  Social History Social History  Substance Use Topics  . Smoking status: Never Smoker  . Smokeless tobacco: Never Used  . Alcohol use No     Allergies   Codeine; Flagyl [metronidazole]; Morphine and related; and Remeron [mirtazapine]   Review of Systems Review of Systems  Constitutional: Negative for chills and fever.  HENT: Negative.   Respiratory: Negative.   Cardiovascular: Negative.   Gastrointestinal: Negative.   Musculoskeletal: Negative.   Skin: Negative.   Neurological: Negative.   Psychiatric/Behavioral: Positive for agitation and sleep disturbance. Negative for hallucinations and suicidal ideas. The patient is nervous/anxious.      Physical Exam Updated Vital Signs  BP (!) 162/104 (BP Location: Right Arm)   Pulse 80   Temp 98.1 F (36.7 C) (Oral)   Resp 20   SpO2 96%   Physical Exam  Constitutional: She appears well-developed and well-nourished.  HENT:  Head: Normocephalic.  Neck: Normal range of motion. Neck supple.  Cardiovascular: Normal rate and regular rhythm.   Pulmonary/Chest: Effort normal and breath sounds normal.  Abdominal: Soft. Bowel sounds are normal. There is no tenderness. There is no rebound and no guarding.  Musculoskeletal: Normal range of motion.  Neurological: She is alert. No cranial nerve deficit.    Skin: Skin is warm and dry. No rash noted.  Psychiatric: She has a normal mood and affect.     ED Treatments / Results  Labs (all labs ordered are listed, but only abnormal results are displayed) Labs Reviewed - No data to display  EKG  EKG Interpretation None       Radiology No results found.  Procedures Procedures (including critical care time)  Medications Ordered in ED Medications  LORazepam (ATIVAN) tablet 1 mg (1 mg Oral Given 06/12/17 0120)     Initial Impression / Assessment and Plan / ED Course  I have reviewed the triage vital signs and the nursing notes.  Pertinent labs & imaging results that were available during my care of the patient were reviewed by me and considered in my medical decision making (see chart for details).     Patient presents with daughter for symptoms of anxiety, uncontrolled with regular medications. No symptoms of infection, or acute psychiatric condition. She is given an Ativan in the ED and feels better. Per daughter she is more calm and she is comfortable taking her home. Will provide limited number of Ativan and encourage PCP follow up.   Final Clinical Impressions(s) / ED Diagnoses   Final diagnoses:  None   1. Anxiety  New Prescriptions New Prescriptions   No medications on file     Danne HarborUpstill, Brenlynn Fake, PA-C 06/19/17 0622    Melene PlanFloyd, Dan, DO 06/19/17 [phone]

## 2017-06-12 NOTE — ED Notes (Signed)
Provider notified of patients elevated BP, pt will take am BP med when she gets home.

## 2017-08-17 DIAGNOSIS — Z7984 Long term (current) use of oral hypoglycemic drugs: Secondary | ICD-10-CM | POA: Diagnosis not present

## 2017-08-17 DIAGNOSIS — I10 Essential (primary) hypertension: Secondary | ICD-10-CM | POA: Diagnosis not present

## 2017-08-17 DIAGNOSIS — E1165 Type 2 diabetes mellitus with hyperglycemia: Secondary | ICD-10-CM | POA: Diagnosis not present

## 2017-08-17 DIAGNOSIS — Z23 Encounter for immunization: Secondary | ICD-10-CM | POA: Diagnosis not present

## 2017-10-22 DIAGNOSIS — F411 Generalized anxiety disorder: Secondary | ICD-10-CM | POA: Insufficient documentation

## 2017-11-17 DIAGNOSIS — Z6820 Body mass index (BMI) 20.0-20.9, adult: Secondary | ICD-10-CM | POA: Diagnosis not present

## 2017-11-17 DIAGNOSIS — Z1231 Encounter for screening mammogram for malignant neoplasm of breast: Secondary | ICD-10-CM | POA: Diagnosis not present

## 2017-11-17 DIAGNOSIS — E119 Type 2 diabetes mellitus without complications: Secondary | ICD-10-CM | POA: Diagnosis not present

## 2017-11-17 DIAGNOSIS — Z01419 Encounter for gynecological examination (general) (routine) without abnormal findings: Secondary | ICD-10-CM | POA: Diagnosis not present

## 2018-04-13 DIAGNOSIS — Z0000 Encounter for general adult medical examination without abnormal findings: Secondary | ICD-10-CM | POA: Diagnosis not present

## 2018-04-13 DIAGNOSIS — Z794 Long term (current) use of insulin: Secondary | ICD-10-CM | POA: Diagnosis not present

## 2018-04-13 DIAGNOSIS — I10 Essential (primary) hypertension: Secondary | ICD-10-CM | POA: Diagnosis not present

## 2018-04-13 DIAGNOSIS — H6123 Impacted cerumen, bilateral: Secondary | ICD-10-CM | POA: Diagnosis not present

## 2018-04-13 DIAGNOSIS — E119 Type 2 diabetes mellitus without complications: Secondary | ICD-10-CM | POA: Diagnosis not present

## 2018-04-14 DIAGNOSIS — I10 Essential (primary) hypertension: Secondary | ICD-10-CM | POA: Diagnosis not present

## 2018-04-14 DIAGNOSIS — H6123 Impacted cerumen, bilateral: Secondary | ICD-10-CM | POA: Diagnosis not present

## 2018-04-14 DIAGNOSIS — E119 Type 2 diabetes mellitus without complications: Secondary | ICD-10-CM | POA: Diagnosis not present

## 2018-04-15 DIAGNOSIS — F4323 Adjustment disorder with mixed anxiety and depressed mood: Secondary | ICD-10-CM | POA: Insufficient documentation

## 2018-07-12 ENCOUNTER — Ambulatory Visit: Payer: Medicare Other | Admitting: *Deleted

## 2018-09-17 ENCOUNTER — Encounter (HOSPITAL_COMMUNITY): Payer: Self-pay | Admitting: Emergency Medicine

## 2018-09-17 ENCOUNTER — Other Ambulatory Visit: Payer: Self-pay

## 2018-09-17 ENCOUNTER — Emergency Department (HOSPITAL_COMMUNITY): Payer: Medicare Other

## 2018-09-17 ENCOUNTER — Emergency Department (HOSPITAL_COMMUNITY)
Admission: EM | Admit: 2018-09-17 | Discharge: 2018-09-17 | Disposition: A | Discharge status: Home / Self Care | Payer: Medicare Other | Attending: Emergency Medicine | Admitting: Emergency Medicine

## 2018-09-17 DIAGNOSIS — R1031 Right lower quadrant pain: Secondary | ICD-10-CM | POA: Diagnosis not present

## 2018-09-17 DIAGNOSIS — E119 Type 2 diabetes mellitus without complications: Secondary | ICD-10-CM | POA: Insufficient documentation

## 2018-09-17 DIAGNOSIS — R112 Nausea with vomiting, unspecified: Secondary | ICD-10-CM | POA: Insufficient documentation

## 2018-09-17 DIAGNOSIS — Z79899 Other long term (current) drug therapy: Secondary | ICD-10-CM | POA: Diagnosis not present

## 2018-09-17 DIAGNOSIS — I10 Essential (primary) hypertension: Secondary | ICD-10-CM | POA: Insufficient documentation

## 2018-09-17 DIAGNOSIS — R1032 Left lower quadrant pain: Secondary | ICD-10-CM | POA: Diagnosis not present

## 2018-09-17 DIAGNOSIS — Z794 Long term (current) use of insulin: Secondary | ICD-10-CM | POA: Diagnosis not present

## 2018-09-17 LAB — CBC
HCT: 37.7 % (ref 36.0–46.0)
Hemoglobin: 12 g/dL (ref 12.0–15.0)
MCH: 28.4 pg (ref 26.0–34.0)
MCHC: 31.8 g/dL (ref 30.0–36.0)
MCV: 89.3 fL (ref 78.0–100.0)
PLATELETS: 283 10*3/uL (ref 150–400)
RBC: 4.22 MIL/uL (ref 3.87–5.11)
RDW: 13.2 % (ref 11.5–15.5)
WBC: 15 10*3/uL — AB (ref 4.0–10.5)

## 2018-09-17 LAB — URINALYSIS, ROUTINE W REFLEX MICROSCOPIC
BILIRUBIN URINE: NEGATIVE
Bacteria, UA: NONE SEEN
Glucose, UA: >=500 mg/dL — AB
Ketones, ur: NEGATIVE mg/dL
LEUKOCYTES UA: NEGATIVE
NITRITE: NEGATIVE
PH: 6 (ref 5.0–8.0)
Protein, ur: NEGATIVE mg/dL
RBC / HPF: >50 RBC/hpf — ABNORMAL HIGH (ref 0–5)
SPECIFIC GRAVITY, URINE: 1.024 (ref 1.005–1.030)

## 2018-09-17 LAB — COMPREHENSIVE METABOLIC PANEL
ALK PHOS: 48 U/L (ref 38–126)
ALT: 17 U/L (ref 0–44)
AST: 17 U/L (ref 15–41)
Albumin: 4 g/dL (ref 3.5–5.0)
Anion gap: 10 (ref 5–15)
BILIRUBIN TOTAL: 0.4 mg/dL (ref 0.3–1.2)
BUN: 18 mg/dL (ref 6–20)
CO2: 30 mmol/L (ref 22–32)
CREATININE: 0.68 mg/dL (ref 0.44–1.00)
Calcium: 9.1 mg/dL (ref 8.9–10.3)
Chloride: 102 mmol/L (ref 98–111)
GFR calc Af Amer: >60 mL/min (ref >60)
GLUCOSE: 298 mg/dL — AB (ref 70–99)
Potassium: 3.5 mmol/L (ref 3.5–5.1)
Sodium: 142 mmol/L (ref 135–145)
TOTAL PROTEIN: 7.1 g/dL (ref 6.5–8.1)

## 2018-09-17 LAB — LIPASE, BLOOD: LIPASE: 31 U/L (ref 11–51)

## 2018-09-17 MED ORDER — FENTANYL CITRATE (PF) 100 MCG/2ML IJ SOLN
50.0000 ug | Freq: Once | INTRAMUSCULAR | Status: AC
  Administered 2018-09-17: 50 ug via INTRAVENOUS
  Filled 2018-09-17: qty 2

## 2018-09-17 MED ORDER — ONDANSETRON HCL 4 MG PO TABS: 4.0000 mg | ORAL_TABLET | Freq: Four times a day (QID) | ORAL | 0 refills | Status: DC

## 2018-09-17 MED ORDER — HYDROCODONE-ACETAMINOPHEN 5-325 MG PO TABS: 1.0000 | ORAL_TABLET | Freq: Four times a day (QID) | ORAL | 0 refills | Status: DC | PRN

## 2018-09-17 MED ORDER — IBUPROFEN 600 MG PO TABS: 600.0000 mg | ORAL_TABLET | Freq: Four times a day (QID) | ORAL | 0 refills | Status: DC | PRN

## 2018-09-17 MED ORDER — ONDANSETRON HCL 4 MG/2ML IJ SOLN
4.0000 mg | Freq: Once | INTRAMUSCULAR | Status: AC
  Administered 2018-09-17: 4 mg via INTRAVENOUS
  Filled 2018-09-17: qty 2

## 2018-09-17 MED ORDER — METOCLOPRAMIDE HCL 10 MG PO TABS: 10.0000 mg | ORAL_TABLET | Freq: Four times a day (QID) | ORAL | 0 refills | Status: DC

## 2018-09-17 MED ORDER — IOPAMIDOL (ISOVUE-300) INJECTION 61%
100.0000 mL | Freq: Once | INTRAVENOUS | Status: AC | PRN
  Administered 2018-09-17: 100 mL via INTRAVENOUS

## 2018-09-17 MED ORDER — IOPAMIDOL (ISOVUE-300) INJECTION 61%
INTRAVENOUS | Status: AC
  Filled 2018-09-17: qty 100

## 2018-09-17 MED ORDER — SODIUM CHLORIDE 0.9 % IV BOLUS
1000.0000 mL | Freq: Once | INTRAVENOUS | Status: AC
  Administered 2018-09-17: 1000 mL via INTRAVENOUS

## 2018-09-17 NOTE — Discharge Instructions (Addendum)
Medications: Ibuprofen, Norco, Zofran  Treatment: Take ibuprofen every 6 hours as needed for pain.  For severe pain, take 1-2 Norco every 6 hours.  Take Zofran every 6 hours as needed for nausea or vomiting.  Do not drink alcohol, drive, operate machinery or participate in any other potentially dangerous activities while taking opiate pain medication as it may make you sleepy. Do not take this medication with any other sedating medications, either prescription or over-the-counter. If you were prescribed Percocet or Vicodin, do not take these with acetaminophen (Tylenol) as it is already contained within these medications and overdose of Tylenol is dangerous.   This medication is an opiate (or narcotic) pain medication and can be habit forming.  Use it as little as possible to achieve adequate pain control.  Do not use or use it with extreme caution if you have a history of opiate abuse or dependence. This medication is intended for your use only - do not give any to anyone else and keep it in a secure place where nobody else, especially children, have access to it. It will also cause or worsen constipation, so you may want to consider taking an over-the-counter stool softener while you are taking this medication.  Follow-up: Please follow-up with the urologist below for further evaluation and treatment of your symptoms, as there were findings of inflammation on your right ureter today.  Please call on Monday morning to make an appointment as soon as available. Please return the emergency department if you develop any new or worsening symptoms.

## 2018-09-17 NOTE — ED Triage Notes (Signed)
Pt presents with RLQ pain. Patient states that the pain began yesterday. Patient endorses N/V. Patient states she is a little constipated.

## 2018-09-17 NOTE — ED Notes (Signed)
Patient transported to CT 

## 2018-09-17 NOTE — ED Notes (Signed)
Pt observed walking to the BR w/ daughter. Pt walks w/o assistance and steady gait

## 2018-09-17 NOTE — ED Notes (Signed)
Pt observed to ambulate to BR with daughter. Pt walks with steady gait and w/o assistance.

## 2018-09-17 NOTE — ED Provider Notes (Addendum)
[facility]-EMERGENCY DEPT Provider Note   CSN: [id number] Arrival date & time: 09/17/18  0442     History   Chief Complaint Chief Complaint  Patient presents with  . Abdominal Pain    RLQ    HPI Jaclyn [name] is a 62 y.o. female with history of hypertension, diabetes, depression, anxiety, bipolar disorder who presents with right lower quadrant abdominal pain, nausea, and vomiting that began around 2 PM yesterday.  Her pain is been constant.  She has history of colitis, however she does not remember this feeling like this at all.  She has history of tubal ligation, but otherwise no other abdominal surgeries.  She denies any fevers, chest pain, diarrhea, bloody stools, urinary symptoms.  She reports she is only short of breath when her pain is high.  She has not taken any medications at home for symptoms.  HPI  Past Medical History:  Diagnosis Date  . Anxiety   . Arthritis   . Depression   . Diabetes mellitus without complication (HCC)   . Hypertension     Patient Active Problem List   Diagnosis Date Noted  . Bipolar depression (HCC) 07/07/2013  . Delirium, drug-induced (HCC) 11/10/2012  . Drug overdose, multiple drugs 11/09/2012  . Suicidal behavior 11/09/2012  . DM type 2 (diabetes mellitus, type 2) (HCC) 11/09/2012  . Abnormal EKG 11/09/2012  . 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 Hypertension 11/09/2012  . Depression     Past Surgical History:  Procedure Laterality Date  . NASAL SEPTUM SURGERY    . TUBAL LIGATION       OB History   None      Home Medications    Prior to Admission medications   Medication Sig Start Date End Date Taking? Authorizing Provider  busPIRone (BUSPAR) 10 MG tablet Take 10 mg by mouth 2 (two) times daily. 07/16/18  Yes [provider]  cetirizine (ZYRTEC) 10 MG chewable tablet Chew 10 mg by mouth daily.   Yes [provider]  enalapril (VASOTEC) 10 MG tablet Take 1 tablet (10 mg total)  by mouth daily. 04/06/15  Yes Kriste Basque R, DO  glipiZIDE (GLUCOTROL) 10 MG tablet Take 1 tablet (10 mg total) by mouth daily. 04/06/15  Yes Terressa Koyanagi, DO  Insulin Detemir (LEVEMIR FLEXTOUCH) 100 UNIT/ML Pen Inject 28 Units into the skin daily at 10 pm. Patient taking differently: Inject 14 Units into the skin daily at 10 pm.  07/04/15  Yes Terressa Koyanagi, DO  metFORMIN (GLUCOPHAGE) 1000 MG tablet TAKE ONE TABLET BY MOUTH TWICE DAILY WITH  A  MEAL Patient taking differently: Take 1,000 mg by mouth daily with breakfast.  04/06/15  Yes Terressa Koyanagi, DO  Multiple Vitamin (MULTIVITAMIN WITH MINERALS) TABS tablet Take 1 tablet by mouth daily.   Yes [provider]  risperiDONE (RISPERDAL) 0.5 MG tablet TAKE ONE TABLET BY MOUTH TWICE DAILY FOR MOOD  CONTROL. Patient taking differently: Take 0.5 mg by mouth 2 (two) times daily.  05/24/15  Yes Worthy Rancher B, FNP  sertraline (ZOLOFT) 100 MG tablet Take 100 mg by mouth daily. 05/07/17  Yes [provider]  glucose blood test strip Use to check blood sugar twice daily Patient taking differently: 1 each by Other route as needed (blood sugar). One touch verio--Use to check blood sugar twice daily 02/27/15   Worthy Rancher B, FNP  HYDROcodone-acetaminophen (NORCO/VICODIN) 5-325 MG tablet Take 1-2 tablets by mouth every 6 (six)  hours as needed. 09/17/18   Shahzain Kiester, Waylan BogaAlexandra M, PA-C  ibuprofen (ADVIL,MOTRIN) 600 MG tablet Take 1 tablet (600 mg total) by mouth every 6 (six) hours as needed. 09/17/18   Lucious Zou, Waylan BogaAlexandra M, PA-C  Insulin Pen Needle (BD PEN NEEDLE NANO U/F) 32G X 4 MM MISC Test daily. 09/11/14   Toniann Ketucker, Matthew K, PA-C  LORazepam (ATIVAN) 1 MG tablet Take 1 tablet (1 mg total) by mouth every 8 (eight) hours as needed for anxiety. Patient not taking: Reported on 09/17/2018 06/12/17   Elpidio AnisUpstill, Shari, PA-C  ondansetron (ZOFRAN) 4 MG tablet Take 1 tablet (4 mg total) by mouth every 6 (six) hours. 09/17/18   Emi HolesLaw, Conan Mcmanaway M, PA-C    Family History No  family history on file.  Social History Social History   Tobacco Use  . Smoking status: Never Smoker  . Smokeless tobacco: Never Used  Substance Use Topics  . Alcohol use: No  . Drug use: No     Allergies   Codeine; Flagyl [metronidazole]; Morphine and related; and Remeron [mirtazapine]   Review of Systems Review of Systems  Constitutional: Negative for chills and fever.  HENT: Negative for facial swelling and sore throat.   Respiratory: Negative for shortness of breath.   Cardiovascular: Negative for chest pain.  Gastrointestinal: Positive for abdominal pain, nausea and vomiting. Negative for blood in stool and diarrhea.  Genitourinary: Negative for dysuria.  Musculoskeletal: Negative for back pain.  Skin: Negative for rash and wound.  Neurological: Negative for headaches.  Psychiatric/Behavioral: The patient is not nervous/anxious.      Physical Exam Updated Vital Signs BP (!) 161/79   Pulse 78   Temp 99 F (37.2 C) (Oral)   Resp 18   Ht 5\' 5"  (1.651 m)   Wt 54.4 kg   SpO2 93%   BMI 19.97 kg/m   Physical Exam  Constitutional: She appears well-developed and well-nourished. No distress.  HENT:  Head: Normocephalic and atraumatic.  Mouth/Throat: Oropharynx is clear and moist. No oropharyngeal exudate.  Eyes: Pupils are equal, round, and reactive to light. Conjunctivae are normal. Right eye exhibits no discharge. Left eye exhibits no discharge. No scleral icterus.  Neck: Normal range of motion. Neck supple. No thyromegaly present.  Cardiovascular: Normal rate, regular rhythm, normal heart sounds and intact distal pulses. Exam reveals no gallop and no friction rub.  No murmur heard. Pulmonary/Chest: Effort normal and breath sounds normal. No stridor. No respiratory distress. She has no wheezes. She has no rales.  Abdominal: Soft. Bowel sounds are normal. She exhibits no distension. There is tenderness in the right lower quadrant, suprapubic area and left lower  quadrant. There is tenderness at McBurney's point and positive Murphy's sign. There is no rebound and no guarding.  + Rovsing's  Musculoskeletal: She exhibits no edema.  Lymphadenopathy:    She has no cervical adenopathy.  Neurological: She is alert. Coordination normal.  Skin: Skin is warm and dry. No rash noted. She is not diaphoretic. No pallor.  Psychiatric: She has a normal mood and affect.  Nursing note and vitals reviewed.    ED Treatments / Results  Labs (all labs ordered are listed, but only abnormal results are displayed) Labs Reviewed  COMPREHENSIVE METABOLIC PANEL - Abnormal; Notable for the following components:      Result Value   Glucose, Bld 298 (*)    All other components within normal limits  CBC - Abnormal; Notable for the following components:   WBC 15.0 (*)  All other components within normal limits  URINALYSIS, ROUTINE W REFLEX MICROSCOPIC - Abnormal; Notable for the following components:   Color, Urine STRAW (*)    Glucose, UA >=500 (*)    Hgb urine dipstick LARGE (*)    RBC / HPF >50 (*)    All other components within normal limits  URINE CULTURE  LIPASE, BLOOD    EKG None  Radiology Ct Abdomen Pelvis W Contrast  Result Date: 09/17/2018 CLINICAL DATA:  62 year old female with right lower quadrant abdominal pain with nausea this morning. Query appendicitis. EXAM: CT ABDOMEN AND PELVIS WITH CONTRAST TECHNIQUE: Multidetector CT imaging of the abdomen and pelvis was performed using the standard protocol following bolus administration of intravenous contrast. CONTRAST:  ISOVUE-300 IOPAMIDOL (ISOVUE-300) INJECTION 61% COMPARISON:  Report of [facility] Abdomen and Pelvis 10/20/2012 (no images available). FINDINGS: Lower chest: Negative lung bases. No pericardial or pleural effusion. There is a small hiatal hernia. Hepatobiliary: Negative gallbladder. Negative liver aside from probable benign hemangioma in the left hepatic lobe best seen  on coronal series 7, image 34. Pancreas: Negative. Spleen: Negative. Adrenals/Urinary Tract: Normal adrenal glands. The left kidney and left ureter appear normal. Both the right kidney and right ureter are abnormal. There is moderate right perinephric and pararenal space inflammatory stranding. The right kidney does not appear hydronephrotic, although there is a subtle delayed right nephrogram. On the delayed phase images the bilateral renal contrast excretion and proximal ureter opacification appears symmetric. On the initial phase images, no striated nephrogram to suggest pyelonephritis. However, the right ureter is abnormally enlarged and demonstrates conspicuous urothelial thickening and enhancement throughout its course (series 2, images 53, 57). The abnormal enlargement and urothelial thickening of the right ureter continues in the pelvis as seen on series 4, image 5 and series 10 images 39 through 45. There are multiple pelvic phleboliths, but no convincing obstructing distal ureteral calculus. It appears that the ureter remains abnormal to the ureterovesical junction. However, the urinary bladder itself appears unremarkable. No perivesical inflammatory stranding. Stomach/Bowel: Decompressed rectosigmoid colon with no definite inflammation. Similar decompressed appearance of the descending and transverse colon. Redundant transverse colon and flexures. Mild inflammatory thickening of the right lateral conal fascia appears related to the right pararenal space. Decompressed ascending colon similar to the other segments. The cecum and appendix appear normal (series 2, image 58). Terminal ileum within normal limits. No dilated small bowel. No definite inflamed small bowel loops. Small gastric hiatal hernia. Intraabdominal stomach within normal limits. Mild inflammation along the right lateral course of the duodenum appears related to the pararenal space. No abdominal free air, free fluid. Vascular/Lymphatic: Mild  for age atherosclerosis in the abdomen and pelvis. The major arterial structures appear patent. The portal venous system is patent. No lymphadenopathy. Reproductive: Within normal limits. Other: No definite pelvic free fluid. Musculoskeletal: Mild lumbar spine degeneration. Severe bilateral hip degeneration. No acute osseous abnormality identified. IMPRESSION: 1. The abnormality is in the right urinary system. The right ureter is abnormal throughout its course appearing dilated and inflamed. There are multiple pelvic phleboliths, but no definite ureteral stone. The ureter remains abnormal to the UVJ, but no bladder mass or abnormality is evident. There is a delayed right nephrogram, and moderate perinephric/pararenal space inflammation. However, the appearance does not rise to the level of pyelonephritis. Etiology is unclear but perhaps this is an ascending infection of the ureter. Consider further evaluation with endoscopy and retrograde pyelography. 2. The appendix is normal. There is mild secondary  appearing inflammation of the duodenum and right colon related to #1. The small and large bowel loops are largely decompressed throughout the abdomen. 3. Small hiatal hernia. Small benign liver hemangioma suspected in the lateral left lobe. Severe bilateral hip degeneration. Electronically Signed   By: Odessa Fleming M.D.   On: 09/17/2018 08:21    Procedures Procedures (including critical care time)  Medications Ordered in ED Medications  iopamidol (ISOVUE-300) 61 % injection (has no administration in time range)  fentaNYL (SUBLIMAZE) injection 50 mcg (50 mcg Intravenous Given 09/17/18 0634)  ondansetron (ZOFRAN) injection 4 mg (4 mg Intravenous Given 09/17/18 0629)  sodium chloride 0.9 % bolus 1,000 mL (0 mLs Intravenous Stopped 09/17/18 0809)  iopamidol (ISOVUE-300) 61 % injection 100 mL (100 mLs Intravenous Contrast Given 09/17/18 0713)     Initial Impression / Assessment and Plan / ED Course  I have reviewed  the triage vital signs and the nursing notes.  Pertinent labs & imaging results that were available during my care of the patient were reviewed by me and considered in my medical decision making (see chart for details).     Patient with right lower quadrant pain.  CT shows an abnormality in the right urinary tract, however no stone is seen.  No signs indicating pyelonephritis.  CBC shows WBC 15.0.  Patient hyperglycemic at 298, fluids given.  UA shows large hematuria and greater than 50 RBCs.  There are no signs of infection.  Patient pain and symptoms controlled in the ED with fentanyl and Zofran.  I discussed patient case with urologist on-call, Dr. Retta Diones, who advised pain control, Zofran, and follow-up with urology in the office.  I reviewed the [place] narcotic database and found no discrepancies.  Return precautions discussed with the patient.  She understands and agrees with plan.  Patient vitals stable throughout ED course and discharged in satisfactory condition.  Patient also evaluated by Dr. Estell Harpin who guided the patient's management and agrees with plan.  Patient called pharmacy and found out Zofran would not be covered by insurance.  I spoke with pharmacist and was told most antiemetics would not be covered, but will send in Reglan as well just in case.  Final Clinical Impressions(s) / ED Diagnoses   Final diagnoses:  RLQ abdominal pain    ED Discharge Orders         Ordered    HYDROcodone-acetaminophen (NORCO/VICODIN) 5-325 MG tablet  Every 6 hours PRN     09/17/18 0953    ibuprofen (ADVIL,MOTRIN) 600 MG tablet  Every 6 hours PRN     09/17/18 0953    ondansetron (ZOFRAN) 4 MG tablet  Every 6 hours     09/17/18 0953           Emi Holes, PA-C 09/17/18 [address], [place], PA-C 09/17/18 1029    Bethann Berkshire, MD 09/17/18 1552

## 2018-09-18 LAB — URINE CULTURE: Culture: <10000 — AB

## 2018-12-10 ENCOUNTER — Emergency Department (HOSPITAL_COMMUNITY)
Admission: EM | Admit: 2018-12-10 | Discharge: 2018-12-10 | Disposition: A | Discharge status: Home / Self Care | Payer: Medicare Other | Attending: Emergency Medicine | Admitting: Emergency Medicine

## 2018-12-10 ENCOUNTER — Other Ambulatory Visit: Payer: Self-pay

## 2018-12-10 ENCOUNTER — Encounter (HOSPITAL_COMMUNITY): Payer: Self-pay

## 2018-12-10 ENCOUNTER — Emergency Department (HOSPITAL_COMMUNITY): Payer: Medicare Other

## 2018-12-10 DIAGNOSIS — E119 Type 2 diabetes mellitus without complications: Secondary | ICD-10-CM | POA: Diagnosis not present

## 2018-12-10 DIAGNOSIS — R300 Dysuria: Secondary | ICD-10-CM | POA: Insufficient documentation

## 2018-12-10 DIAGNOSIS — I10 Essential (primary) hypertension: Secondary | ICD-10-CM | POA: Diagnosis not present

## 2018-12-10 DIAGNOSIS — Z79899 Other long term (current) drug therapy: Secondary | ICD-10-CM | POA: Insufficient documentation

## 2018-12-10 DIAGNOSIS — Z794 Long term (current) use of insulin: Secondary | ICD-10-CM | POA: Insufficient documentation

## 2018-12-10 DIAGNOSIS — R112 Nausea with vomiting, unspecified: Secondary | ICD-10-CM | POA: Diagnosis not present

## 2018-12-10 DIAGNOSIS — R1033 Periumbilical pain: Secondary | ICD-10-CM | POA: Diagnosis not present

## 2018-12-10 LAB — URINALYSIS, ROUTINE W REFLEX MICROSCOPIC
BACTERIA UA: NONE SEEN
BILIRUBIN URINE: NEGATIVE
Glucose, UA: >=500 mg/dL — AB
Hgb urine dipstick: NEGATIVE
Ketones, ur: 5 mg/dL — AB
Nitrite: NEGATIVE
PH: 5 (ref 5.0–8.0)
Protein, ur: NEGATIVE mg/dL
SPECIFIC GRAVITY, URINE: 1.037 — AB (ref 1.005–1.030)

## 2018-12-10 LAB — COMPREHENSIVE METABOLIC PANEL
ALT: 18 U/L (ref 0–44)
AST: 14 U/L — ABNORMAL LOW (ref 15–41)
Albumin: 4 g/dL (ref 3.5–5.0)
Alkaline Phosphatase: 44 U/L (ref 38–126)
Anion gap: 9 (ref 5–15)
BUN: 16 mg/dL (ref 6–20)
CHLORIDE: 103 mmol/L (ref 98–111)
CO2: 29 mmol/L (ref 22–32)
CREATININE: 0.58 mg/dL (ref 0.44–1.00)
Calcium: 8.9 mg/dL (ref 8.9–10.3)
GFR calc non Af Amer: >60 mL/min (ref >60)
Glucose, Bld: 220 mg/dL — ABNORMAL HIGH (ref 70–99)
Potassium: 3.9 mmol/L (ref 3.5–5.1)
SODIUM: 141 mmol/L (ref 135–145)
Total Bilirubin: 0.3 mg/dL (ref 0.3–1.2)
Total Protein: 7.1 g/dL (ref 6.5–8.1)

## 2018-12-10 LAB — CBC
HEMATOCRIT: 36.2 % (ref 36.0–46.0)
Hemoglobin: 11.3 g/dL — ABNORMAL LOW (ref 12.0–15.0)
MCH: 28.2 pg (ref 26.0–34.0)
MCHC: 31.2 g/dL (ref 30.0–36.0)
MCV: 90.3 fL (ref 80.0–100.0)
NRBC: 0 % (ref 0.0–0.2)
PLATELETS: 255 10*3/uL (ref 150–400)
RBC: 4.01 MIL/uL (ref 3.87–5.11)
RDW: 13.3 % (ref 11.5–15.5)
WBC: 8.3 10*3/uL (ref 4.0–10.5)

## 2018-12-10 LAB — LIPASE, BLOOD: LIPASE: 40 U/L (ref 11–51)

## 2018-12-10 MED ORDER — FAMOTIDINE 20 MG PO TABS
20.0000 mg | ORAL_TABLET | Freq: Once | ORAL | Status: AC
  Administered 2018-12-10: 20 mg via ORAL
  Filled 2018-12-10: qty 1

## 2018-12-10 MED ORDER — IOPAMIDOL (ISOVUE-300) INJECTION 61%
INTRAVENOUS | Status: AC
  Filled 2018-12-10: qty 100

## 2018-12-10 MED ORDER — ONDANSETRON HCL 4 MG PO TABS: 4.0000 mg | ORAL_TABLET | Freq: Three times a day (TID) | ORAL | 0 refills | Status: DC | PRN

## 2018-12-10 MED ORDER — SODIUM CHLORIDE (PF) 0.9 % IJ SOLN
INTRAMUSCULAR | Status: AC
  Filled 2018-12-10: qty 50

## 2018-12-10 MED ORDER — ONDANSETRON HCL 4 MG PO TABS
4.0000 mg | ORAL_TABLET | Freq: Once | ORAL | Status: AC
  Administered 2018-12-10: 4 mg via ORAL
  Filled 2018-12-10: qty 1

## 2018-12-10 MED ORDER — IOPAMIDOL (ISOVUE-300) INJECTION 61%
100.0000 mL | Freq: Once | INTRAVENOUS | Status: AC | PRN
  Administered 2018-12-10: 100 mL via INTRAVENOUS

## 2018-12-10 NOTE — ED Notes (Signed)
Urine culture sent down to lab with uninalysis

## 2018-12-10 NOTE — Discharge Instructions (Signed)
Take medication as directed.   Call the gastroenterology office to make an appointment for follow up.   Please follow up with your primary doctor within the next 5-7 days.  If you do not have a primary care provider, information for a healthcare clinic has been provided for you to make arrangements for follow up care. Please return to the ER sooner if you have any new or worsening symptoms, or if you have any of the following symptoms:  Abdominal pain that does not go away.  You have a fever.  You keep throwing up (vomiting).  The pain is felt only in portions of the abdomen. Pain in the right side could possibly be appendicitis. In an adult, pain in the left lower portion of the abdomen could be colitis or diverticulitis.  You pass bloody or black tarry stools.  There is bright red blood in the stool.  The constipation stays for more than 4 days.  There is belly (abdominal) or rectal pain.  You do not seem to be getting better.  You have any questions or concerns.

## 2018-12-10 NOTE — ED Notes (Signed)
Fluid challenged pt with 8oz of water, no emesis, she denies nausea or abdominal pain.

## 2018-12-10 NOTE — ED Triage Notes (Signed)
Patient c/o mid abdominal pain and nausea x 1 month every day. Patient states she has been taking Catering manageralka Seltzer daily. Patient denies any vomiting or diarrhea.

## 2018-12-10 NOTE — ED Provider Notes (Signed)
[facility] Provider Note   CSN: [id number] Arrival date & time: 12/10/18  1518     History   Chief Complaint Chief Complaint  Patient presents with  . Abdominal Pain  . Nausea    HPI Jaclyn [name] is a 62 y.o. female.  HPI   Pt is a 62 y/o with a h/o anxiety, depression, arthritis, diabetes, HTN, who presents to the ED today c/o abd pain that began 1 month ago.  Pain is intermittent. Pain lasts for several hours at a time. Pain located to lower abdomen bilaterally. Pain feels like a burning sensation. Pain is sometimes worse with eating. Reports associated abd bloating, nausea, vomiting (intermittent, rare). Also reports dysuria that is intermittent, but denies hematuria, frequency, or urgency. Denies diarrhea, constipation, hematemesis, hematochezia, melena. 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  Denies ETOH, illicit substance use. Denies heavy NSAID use.   Past Medical History:  Diagnosis Date  . Anxiety   . Arthritis   . Depression   . Diabetes mellitus without complication (HCC)   . Hypertension     Patient Active Problem List   Diagnosis Date Noted  . Bipolar depression (HCC) 07/07/2013  . Delirium, drug-induced (HCC) 11/10/2012  . Drug overdose, multiple drugs 11/09/2012  . Suicidal behavior 11/09/2012  . DM type 2 (diabetes mellitus, type 2) (HCC) 11/09/2012  . Abnormal EKG 11/09/2012  . 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 Hypertension 11/09/2012  . Depression     Past Surgical History:  Procedure Laterality Date  . NASAL SEPTUM SURGERY    . TUBAL LIGATION       OB History   No obstetric history on file.      Home Medications    Prior to Admission medications   Medication Sig Start Date End Date Taking? Authorizing Provider  busPIRone (BUSPAR) 10 MG tablet Take 10 mg by mouth 2 (two) times daily. 07/16/18   [provider]  cetirizine (ZYRTEC) 10 MG chewable tablet Chew 10 mg  by mouth daily.    [provider]  enalapril (VASOTEC) 10 MG tablet Take 1 tablet (10 mg total) by mouth daily. 04/06/15   Terressa Koyanagi, DO  glipiZIDE (GLUCOTROL) 10 MG tablet Take 1 tablet (10 mg total) by mouth daily. 04/06/15   Kriste Basque R, DO  glucose blood test strip Use to check blood sugar twice daily Patient taking differently: 1 each by Other route as needed (blood sugar). One touch verio--Use to check blood sugar twice daily 02/27/15   Worthy Rancher B, FNP  HYDROcodone-acetaminophen (NORCO/VICODIN) 5-325 MG tablet Take 1-2 tablets by mouth every 6 (six) hours as needed. 09/17/18   Law, Waylan Boga, PA-C  ibuprofen (ADVIL,MOTRIN) 600 MG tablet Take 1 tablet (600 mg total) by mouth every 6 (six) hours as needed. 09/17/18   Law, Waylan Boga, PA-C  Insulin Detemir (LEVEMIR FLEXTOUCH) 100 UNIT/ML Pen Inject 28 Units into the skin daily at 10 pm. Patient taking differently: Inject 14 Units into the skin daily at 10 pm.  07/04/15   Terressa Koyanagi, DO  Insulin Pen Needle (BD PEN NEEDLE NANO U/F) 32G X 4 MM MISC Test daily. 09/11/14   Toniann Ket, PA-C  LORazepam (ATIVAN) 1 MG tablet Take 1 tablet (1 mg total) by mouth every 8 (eight) hours as needed for anxiety. Patient not taking: Reported on 09/17/2018 06/12/17   Elpidio Anis, PA-C  metFORMIN (GLUCOPHAGE) 1000 MG tablet TAKE ONE  TABLET BY MOUTH TWICE DAILY WITH  A  MEAL Patient taking differently: Take 1,000 mg by mouth daily with breakfast.  04/06/15   Terressa Koyanagi, DO  metoCLOPramide (REGLAN) 10 MG tablet Take 1 tablet (10 mg total) by mouth every 6 (six) hours. 09/17/18   Emi Holes, PA-C  Multiple Vitamin (MULTIVITAMIN WITH MINERALS) TABS tablet Take 1 tablet by mouth daily.    [provider]  ondansetron (ZOFRAN) 4 MG tablet Take 1 tablet (4 mg total) by mouth every 8 (eight) hours as needed for nausea or vomiting. 12/10/18   Siearra Amberg S, PA-C  risperiDONE (RISPERDAL) 0.5 MG tablet TAKE ONE TABLET BY MOUTH TWICE  DAILY FOR MOOD  CONTROL. Patient taking differently: Take 0.5 mg by mouth 2 (two) times daily.  05/24/15   Worthy Rancher B, FNP  sertraline (ZOLOFT) 100 MG tablet Take 100 mg by mouth daily. 05/07/17   [provider]    Family History History reviewed. No pertinent family history.  Social History Social History   Tobacco Use  . Smoking status: Never Smoker  . Smokeless tobacco: Never Used  Substance Use Topics  . Alcohol use: No  . Drug use: No     Allergies   Codeine; Flagyl [metronidazole]; Morphine and related; and Remeron [mirtazapine]   Review of Systems Review of Systems  Constitutional: Negative for fever.  HENT: Positive for congestion.   Eyes: Negative for visual disturbance.  Respiratory: Negative for cough and shortness of breath.   Cardiovascular: Negative for chest pain.  Gastrointestinal: Positive for abdominal pain, nausea and vomiting. Negative for blood in stool, constipation and diarrhea.  Genitourinary: Positive for dysuria. Negative for frequency, hematuria and urgency.  Musculoskeletal: Negative for back pain.  Skin: Negative for color change.  Neurological: Negative for headaches.   Physical Exam Updated Vital Signs BP (!) 169/85 (BP Location: Left Arm)   Pulse 63   Temp 98.6 F (37 C) (Oral)   Resp 14   Ht 5\' 3"  (1.6 m)   Wt 54.4 kg   SpO2 97%   BMI 21.26 kg/m   Physical Exam Vitals signs and nursing note reviewed.  Constitutional:      General: She is not in acute distress.    Appearance: She is well-developed.  HENT:     Head: Normocephalic and atraumatic.  Eyes:     Conjunctiva/sclera: Conjunctivae normal.  Neck:     Musculoskeletal: Neck supple.  Cardiovascular:     Rate and Rhythm: Normal rate and regular rhythm.     Heart sounds: Normal heart sounds. No murmur.  Pulmonary:     Effort: Pulmonary effort is normal. No respiratory distress.     Breath sounds: Normal breath sounds.  Abdominal:     Palpations: Abdomen  is soft.     Tenderness: There is no right CVA tenderness, left CVA tenderness, guarding or rebound.     Comments: Subjectively has generalized TTP however no grimace on exam and appears distractable. abd appears somewhat bloated.  Skin:    General: Skin is warm and dry.     Capillary Refill: Capillary refill takes less than 2 seconds.  Neurological:     Mental Status: She is alert.  Psychiatric:        Mood and Affect: Mood normal.    ED Treatments / Results  Labs (all labs ordered are listed, but only abnormal results are displayed) Labs Reviewed  COMPREHENSIVE METABOLIC PANEL - Abnormal; Notable for the following components:  Result Value   Glucose, Bld 220 (*)    AST 14 (*)    All other components within normal limits  CBC - Abnormal; Notable for the following components:   Hemoglobin 11.3 (*)    All other components within normal limits  URINALYSIS, ROUTINE W REFLEX MICROSCOPIC - Abnormal; Notable for the following components:   Specific Gravity, Urine 1.037 (*)    Glucose, UA >=500 (*)    Ketones, ur 5 (*)    Leukocytes, UA SMALL (*)    All other components within normal limits  URINE CULTURE  LIPASE, BLOOD    EKG None  Radiology Ct Abdomen Pelvis W Contrast  Result Date: 12/10/2018 CLINICAL DATA:  Periumbilical abdominal pain, nausea, vomiting, history hypertension, diabetes mellitus EXAM: CT ABDOMEN AND PELVIS WITH CONTRAST TECHNIQUE: Multidetector CT imaging of the abdomen and pelvis was performed using the standard protocol following bolus administration of intravenous contrast. Sagittal and coronal MPR images reconstructed from axial data set. CONTRAST:  ISOVUE-300 IOPAMIDOL (ISOVUE-300) INJECTION 61% COMPARISON:  09/17/2018 FINDINGS: Lower chest: Lung bases clear Hepatobiliary: Mass identified lateral segment LEFT lobe liver 18 x 14 x 11 mm diameter with dense nodular peripheral enhancement question hemangioma, unchanged. No additional focal hepatic  abnormalities. Gallbladder unremarkable. Pancreas: Normal appearance Spleen: Normal appearance Adrenals/Urinary Tract: Adrenal glands, kidneys, ureters, and bladder normal appearance Stomach/Bowel: Normal appendix. Stomach and bowel loops normal appearance. Vascular/Lymphatic: Pelvic phleboliths. Aorta normal caliber with scattered atherosclerotic calcifications. Retroaortic LEFT renal vein. No adenopathy. Reproductive: Unremarkable uterus and ovaries Other: No free air or free fluid. No hernia or acute inflammatory process. Musculoskeletal: Mild degenerative disc disease changes and minimal levoconvex scoliosis lumbar spine. Degenerative changes of the hip joints bilaterally. IMPRESSION: Probable small hemangioma lateral segment LEFT lobe liver 18 mm greatest size, unchanged. No acute intra-abdominal or intrapelvic abnormalities. Electronically Signed   By: Ulyses Southward M.D.   On: 12/10/2018 19:34    Procedures Procedures (including critical care time)  Medications Ordered in ED Medications  iopamidol (ISOVUE-300) 61 % injection (has no administration in time range)  sodium chloride (PF) 0.9 % injection (has no administration in time range)  ondansetron (ZOFRAN) tablet 4 mg (4 mg Oral Given 12/10/18 1738)  famotidine (PEPCID) tablet 20 mg (20 mg Oral Given 12/10/18 1759)  iopamidol (ISOVUE-300) 61 % injection 100 mL (100 mLs Intravenous Contrast Given 12/10/18 1919)     Initial Impression / Assessment and Plan / ED Course  I have reviewed the triage vital signs and the nursing notes.  Pertinent labs & imaging results that were available during my care of the patient were reviewed by me and considered in my medical decision making (see chart for details).     Final Clinical Impressions(s) / ED Diagnoses   Final diagnoses:  Periumbilical abdominal pain   Patient presenting with periumbilical abdominal pain and some abdominal bloating.  On exam she does appear to have some bloating of the  abdomen and reports generalized tenderness though has no rigidity, rebound or guarding present.  Her vitals are normal.  Lab work is grossly reassuring however given her reported diffuse abdominal tenderness CT scan of the abdomen was obtained which did not show any acute abnormalities in the abdomen.  She does have a small hemangioma of the liver.  Patient was given Zofran and Pepcid in the ED and had improvement of her symptoms.  She was able to tolerate p.o.  Given her persistent symptoms, will have her follow-up with GI.  Have advised  her to return to the ER for new or worsening symptoms.  She voices understanding the plan reasons return to the ED.  All questions answered.  ED Discharge Orders         Ordered    ondansetron (ZOFRAN) 4 MG tablet  Every 8 hours PRN     12/10/18 2142           Karrie MeresCouture, Merilyn Pagan S, PA-C 12/11/18 0018    Linwood DibblesKnapp, Jon, MD 12/11/18 1537

## 2018-12-12 LAB — URINE CULTURE

## 2019-01-19 DIAGNOSIS — H6993 Unspecified Eustachian tube disorder, bilateral: Secondary | ICD-10-CM | POA: Insufficient documentation

## 2019-01-19 DIAGNOSIS — H6983 Other specified disorders of Eustachian tube, bilateral: Secondary | ICD-10-CM | POA: Insufficient documentation

## 2019-01-19 DIAGNOSIS — K219 Gastro-esophageal reflux disease without esophagitis: Secondary | ICD-10-CM | POA: Insufficient documentation

## 2019-01-19 DIAGNOSIS — J3089 Other allergic rhinitis: Secondary | ICD-10-CM | POA: Insufficient documentation

## 2019-01-19 DIAGNOSIS — H9203 Otalgia, bilateral: Secondary | ICD-10-CM | POA: Insufficient documentation

## 2019-01-19 DIAGNOSIS — J324 Chronic pansinusitis: Secondary | ICD-10-CM | POA: Insufficient documentation

## 2019-01-19 HISTORY — DX: Other specified disorders of eustachian tube, bilateral: H69.83

## 2019-01-19 HISTORY — DX: Otalgia, bilateral: H92.03

## 2019-04-11 ENCOUNTER — Telehealth: Payer: Self-pay

## 2019-04-11 NOTE — Telephone Encounter (Signed)
Pt WOULD LIKE TO CANCEL THIS APPOINTMENT UNTIL SHE IS ABLE TO COME INTO THE OFFICE

## 2019-04-11 NOTE — Telephone Encounter (Signed)
PT

## 2019-04-17 ENCOUNTER — Ambulatory Visit: Payer: Medicare Other | Admitting: Cardiology

## 2019-04-21 ENCOUNTER — Telehealth: Payer: Self-pay | Admitting: Cardiology

## 2019-04-21 NOTE — Telephone Encounter (Signed)
Smartphone/ consent/ my chart via email/ pre reg completed °

## 2019-04-22 NOTE — Progress Notes (Signed)
Virtual Visit via Video Note   This visit type was conducted due to national recommendations for restrictions regarding the COVID-19 Pandemic (e.g. social distancing) in an effort to limit this patient's exposure and mitigate transmission in our community.  Due to her co-morbid illnesses, this patient is at least at moderate risk for complications without adequate follow up.  This format is felt to be most appropriate for this patient at this time.  All issues noted in this document were discussed and addressed.  A limited physical exam was performed with this format.  Please refer to the patient's chart for her consent to telehealth for [facility].   Evaluation Performed:  Follow-up visit  Date:  04/24/2019   ID:  Jaclyn [name], Jaclyn [name] [birth year], MRN [id number]  Patient Location: Home Provider Location: Home  PCP:  Clayborn Heron, MD  Cardiologist:  Rollene Rotunda, MD  Electrophysiologist:  None   Chief Complaint:  HTN  History of Present Illness:    Jaclyn [name] is a 63 y.o. female who is referred by Clayborn Heron, MD for evaluation of difficult to control HTN.   She has had hypertension for many years.  However, for the last 6 months or so it is been poorly controlled.  Prior to that was well controlled on enalapril.  She is seeing systolics that are typically in the 140s to 180s.  Readings like today are not completely unusual.  The diastolic seems to be typically below 90.  She is compliant with diet and activity.  She walks the dog though she does not routinely get exercise.  With this level of activity she denies any chest pressure, neck or arm discomfort.  She denies any palpitations, presyncope or syncope.  She has no shortness of breath, PND or orthopnea.  She is never had any prior cardiac work-up.  I can see a urinalysis done last year that demonstrated elevated sugar in her urine but no protein.  Her creatinine has been normal.  Weight is not an issue.  She does not  snore.  The patient does not have symptoms concerning for COVID-19 infection (fever, chills, cough, or new shortness of breath).    Past Medical History:  Diagnosis Date   Anxiety    Arthritis    Depression    Diabetes mellitus without complication (HCC)    Hypertension    Past Surgical History:  Procedure Laterality Date   NASAL SEPTUM SURGERY     TUBAL LIGATION       Current Meds  Medication Sig   busPIRone (BUSPAR) 10 MG tablet Take 10 mg by mouth 2 (two) times daily.   cetirizine (ZYRTEC) 10 MG chewable tablet Chew 10 mg by mouth daily.   enalapril (VASOTEC) 20 MG tablet Take 20 mg by mouth daily.   glipiZIDE (GLUCOTROL) 10 MG tablet Take 1 tablet (10 mg total) by mouth daily.   ibuprofen (ADVIL,MOTRIN) 600 MG tablet Take 1 tablet (600 mg total) by mouth every 6 (six) hours as needed.   Insulin Detemir (LEVEMIR FLEXTOUCH) 100 UNIT/ML Pen Inject 28 Units into the skin daily at 10 pm. (Patient taking differently: Inject 36 Units into the skin daily at 10 pm. )   Insulin Pen Needle (BD PEN NEEDLE NANO U/F) 32G X 4 MM MISC Test daily.   metFORMIN (GLUCOPHAGE) 1000 MG tablet TAKE ONE TABLET BY MOUTH TWICE DAILY WITH  A  MEAL (Patient taking differently: Take 1,000 mg by mouth 2 (two) times daily with  a meal. )   Multiple Vitamin (MULTIVITAMIN WITH MINERALS) TABS tablet Take 1 tablet by mouth daily.   risperiDONE (RISPERDAL) 0.5 MG tablet TAKE ONE TABLET BY MOUTH TWICE DAILY FOR MOOD  CONTROL. (Patient taking differently: Take 0.5 mg by mouth 2 (two) times daily. )   sertraline (ZOLOFT) 100 MG tablet Take 100 mg by mouth daily.     Allergies:   Codeine; Flagyl [metronidazole]; Morphine and related; and Remeron [mirtazapine]   Social History   Tobacco Use   Smoking status: Never Smoker   Smokeless tobacco: Never Used  Substance Use Topics   Alcohol use: No   Drug use: No     Family Hx: The patient's family history includes Huntington's disease in her  father.  ROS:   Please see the history of present illness.    As stated in the HPI and negative for all other systems.   Prior CV studies:   The following studies were reviewed today:    Labs/Other Tests and Data Reviewed:    EKG:  Normal sinus rhythm, rate 57, axis within normal limits, intervals within normal limits, no acute ST-T wave changes, September 27, 2013  Recent Labs: 12/10/2018: ALT 18; BUN 16; Creatinine, Ser 0.58; Hemoglobin 11.3; Platelets 255; Potassium 3.9; Sodium 141   Recent Lipid Panel Lab Results  Component Value Date/Time   CHOL 166 07/06/2014 04:32 PM   TRIG 272 (H) 07/06/2014 04:32 PM   HDL 38 (L) 07/06/2014 04:32 PM   CHOLHDL 4.4 07/06/2014 04:32 PM   LDLCALC 74 07/06/2014 04:32 PM    Wt Readings from Last 3 Encounters:  04/24/19 122 lb (55.3 kg)  12/10/18 120 lb (54.4 kg)  09/17/18 120 lb (54.4 kg)     Objective:    Vital Signs:  BP (!) 193/91    Pulse 93    Ht 5\' 3"  (1.6 m)    Wt 122 lb (55.3 kg)    BMI 21.61 kg/m    VITAL SIGNS:  reviewed GEN:  no acute distress EYES:  sclerae anicteric, EOMI - Extraocular Movements Intact CARDIOVASCULAR:  Normal JVP at 90 degrees with brisk carotid upstroke NEURO:  alert and oriented x 3, no obvious focal deficit PSYCH:  normal affect  ASSESSMENT & PLAN:    HTN: Her blood pressure is not well controlled but she is only on one agent.  I add Norvasc 5 mg daily.  I am also going to give her hydralazine as needed for to sustain systolic blood pressures above 180.  I gave her and her daughter instructions on how to use this.    I do not see a recent TSH and when she comes back for follow-up I will check this.  DM: Her A1c was 8.2.  We talked about the fact that this needs to be better controlled.  COVID-19 Education: The signs and symptoms of COVID-19 were discussed with the patient and how to seek care for testing (follow up with PCP or arrange E-visit).  The importance of social distancing was discussed  today.  Time:   Today, I have spent 25 minutes with the patient with telehealth technology discussing the above problems.     Medication Adjustments/Labs and Tests Ordered: Current medicines are reviewed at length with the patient today.  Concerns regarding medicines are outlined above.   Tests Ordered: No orders of the defined types were placed in this encounter.   Medication Changes: Meds ordered this encounter  Medications   amLODipine (NORVASC) 5 MG tablet  Sig: Take 1 tablet (5 mg total) by mouth daily.    Dispense:  90 tablet    Refill:  3   hydrALAZINE (APRESOLINE) 10 MG tablet    Sig: Take 1 tablet (10 mg total) by mouth every 8 (eight) hours as needed. For blood pressure over 180    Dispense:  30 tablet    Refill:  6    Disposition:  Follow up six weeks in the office.   Signed, Rollene RotundaJames Keisa Blow, MD  04/24/2019 9:37 AM    [facility]

## 2019-04-24 ENCOUNTER — Telehealth (INDEPENDENT_AMBULATORY_CARE_PROVIDER_SITE_OTHER): Payer: Medicare Other | Admitting: Cardiology

## 2019-04-24 ENCOUNTER — Encounter: Payer: Self-pay | Admitting: Cardiology

## 2019-04-24 VITALS — BP 193/91 | HR 93 | Ht 63.0 in | Wt 122.0 lb

## 2019-04-24 DIAGNOSIS — Z7189 Other specified counseling: Secondary | ICD-10-CM

## 2019-04-24 DIAGNOSIS — I10 Essential (primary) hypertension: Secondary | ICD-10-CM | POA: Diagnosis not present

## 2019-04-24 HISTORY — DX: Other specified counseling: Z71.89

## 2019-04-24 MED ORDER — AMLODIPINE BESYLATE 5 MG PO TABS: 5.0000 mg | ORAL_TABLET | Freq: Every day | ORAL | 3 refills | Status: DC

## 2019-04-24 MED ORDER — HYDRALAZINE HCL 10 MG PO TABS: 10.0000 mg | ORAL_TABLET | Freq: Three times a day (TID) | ORAL | 6 refills | Status: DC | PRN

## 2019-05-10 ENCOUNTER — Telehealth: Payer: Self-pay | Admitting: Cardiology

## 2019-05-10 NOTE — Telephone Encounter (Signed)
Spoke with pt and B/P this am was 137/73 at 8:00 am and had taken meds around 7:00 am and also has checked B/P around 4:00 pm and readings are ranging 153-157/90-91.Wil forward to Dr Antoine Poche for review .Jaclyn [name]

## 2019-05-10 NOTE — Telephone Encounter (Signed)
She can continue the meds as listed.  No change in therapy.  Call Ms. Shavers with the results and send results to Rankins, Fanny Dance, MD

## 2019-05-10 NOTE — Telephone Encounter (Signed)
Daughter of pt called to say that the patient has been taking both Amlodipine and enalapril, and has  average bp readings of 153/91.  She was hoping that her Mom could get into the office to see Dr. Antoine Poche sooner.

## 2019-05-11 NOTE — Telephone Encounter (Signed)
Pt call with Dr Antoine Poche recommendations, pt voice understanding

## 2019-05-24 ENCOUNTER — Telehealth: Payer: Self-pay | Admitting: Cardiology

## 2019-05-24 NOTE — Telephone Encounter (Signed)
LVM regarding preference of phone or video visit for appt with Dr. Antoine Poche.

## 2019-05-31 ENCOUNTER — Telehealth: Payer: Self-pay | Admitting: Cardiology

## 2019-05-31 NOTE — Telephone Encounter (Signed)
Pt returned call to pre-reg, states that she was told this appt was meant to be in-office-- she has already had a virtual visit. Nurse notified. 05/31/2019

## 2019-05-31 NOTE — Telephone Encounter (Signed)
called to pre-reg, no answer, lvm. 05/31/2019 MS

## 2019-06-05 ENCOUNTER — Telehealth: Payer: Self-pay | Admitting: Cardiology

## 2019-06-05 NOTE — Telephone Encounter (Signed)
Patient had a Telemedicine visit on 04/27 with Dr. Percival Spanish. In her appointment summary, he wanted to see her in the office in six weeks. She has an appt scheduled on 06/11. The patient thought this was going to be an in person visit, but her MyChart states this is a virtual visit. Please confirm appointment types, and leave a detailed message on her answering machine if you are unable to reach her

## 2019-06-05 NOTE — Telephone Encounter (Signed)
Should this visit be in office or telehealth visit?  Thank you!

## 2019-06-06 NOTE — Telephone Encounter (Signed)
Call and spoke with pt to let her know appt is office visit, pt voice understanding and thanks

## 2019-06-07 ENCOUNTER — Other Ambulatory Visit (HOSPITAL_COMMUNITY)
Admission: RE | Admit: 2019-06-07 | Discharge: 2019-06-07 | Disposition: A | Discharge status: Home / Self Care | Payer: Medicare Other | Source: Ambulatory Visit | Attending: Family Medicine | Admitting: Family Medicine

## 2019-06-07 ENCOUNTER — Other Ambulatory Visit: Payer: Self-pay | Admitting: Family Medicine

## 2019-06-07 DIAGNOSIS — Z124 Encounter for screening for malignant neoplasm of cervix: Secondary | ICD-10-CM | POA: Insufficient documentation

## 2019-06-07 DIAGNOSIS — Z1151 Encounter for screening for human papillomavirus (HPV): Secondary | ICD-10-CM | POA: Diagnosis not present

## 2019-06-07 NOTE — Progress Notes (Signed)
Cardiology Office Note   Date:  06/08/2019   ID:  Matea, Stanard [birth year], MRN [id number]  PCP:  Aretta Nip, MD  Cardiologist:   Minus Breeding, MD    No chief complaint on file.     History of Present Illness: Jaclyn [name] is a 63 y.o. female who presents for who was referred by Aretta Nip, MD for evaluation of difficult to control HTN.  I added Norvasc and PRN hydralazine.    Since I last saw him he has done well.  The patient denies any new symptoms such as chest discomfort, neck or arm discomfort. There has been no new shortness of breath, PND or orthopnea. There have been no reported palpitations, presyncope or syncope.    She says her blood pressure has spiked only once when she is needed hydralazine.  I am seeing readings in the 150s 160s fairly consistently.  She is done okay with the amlodipine though.  She is not having any palpitations, presyncope or syncope.  She is not having any shortness of breath, PND or orthopnea.  She is had no weight gain or edema.   Past Medical History:  Diagnosis Date  . Anxiety   . Arthritis   . Depression   . Diabetes mellitus without complication ([place])   . Hypertension     Past Surgical History:  Procedure Laterality Date  . NASAL SEPTUM SURGERY    . TUBAL LIGATION       Current Outpatient Medications  Medication Sig Dispense Refill  . busPIRone (BUSPAR) 10 MG tablet Take 10 mg by mouth 2 (two) times daily.  1  . cetirizine (ZYRTEC) 10 MG chewable tablet Chew 10 mg by mouth daily.    . enalapril (VASOTEC) 10 MG tablet Take 1 tablet by mouth 2 (two) times a day.    Marland Kitchen glipiZIDE (GLUCOTROL) 10 MG tablet Take 1 tablet (10 mg total) by mouth daily. 90 tablet 3  . glucose blood test strip Use to check blood sugar twice daily (Patient taking differently: 1 each by Other route as needed (blood sugar). One touch verio--Use to check blood sugar twice daily) 100 each 5  . hydrALAZINE (APRESOLINE) 10 MG tablet Take 1  tablet (10 mg total) by mouth every 8 (eight) hours as needed. For blood pressure over 180 30 tablet 6  . ibuprofen (ADVIL,MOTRIN) 600 MG tablet Take 1 tablet (600 mg total) by mouth every 6 (six) hours as needed. 30 tablet 0  . Insulin Detemir (LEVEMIR FLEXTOUCH) 100 UNIT/ML Pen Inject 28 Units into the skin daily at 10 pm. (Patient taking differently: Inject 36 Units into the skin daily at 10 pm. ) 9 mL 0  . Insulin Pen Needle (BD PEN NEEDLE NANO U/F) 32G X 4 MM MISC Test daily. 100 each 5  . metFORMIN (GLUCOPHAGE) 1000 MG tablet TAKE ONE TABLET BY MOUTH TWICE DAILY WITH  A  MEAL (Patient taking differently: Take 1,000 mg by mouth 2 (two) times daily with a meal. ) 180 tablet 3  . Multiple Vitamin (MULTIVITAMIN WITH MINERALS) TABS tablet Take 1 tablet by mouth daily.    . risperiDONE (RISPERDAL) 0.5 MG tablet TAKE ONE TABLET BY MOUTH TWICE DAILY FOR MOOD  CONTROL. (Patient taking differently: Take 0.5 mg by mouth 2 (two) times daily. ) 180 tablet 0  . SERTRALINE HCL PO Take 200 mg by mouth daily.     Marland Kitchen amLODipine (NORVASC) 10 MG tablet Take 1 tablet (10 mg  total) by mouth daily. 180 tablet 3   No current facility-administered medications for this visit.     Allergies:   Codeine, Flagyl [metronidazole], Morphine and related, and Remeron [mirtazapine]   ROS:  Please see the history of present illness.   Otherwise, review of systems are positive for she is being actively managed for anxiety..   All other systems are reviewed and negative.    PHYSICAL EXAM: VS:  BP 140/78   Pulse 84   Temp 97.8 F (36.6 C)   Ht 5\' 5"  (1.651 m)   Wt 121 lb (54.9 kg)   SpO2 96%   BMI 20.14 kg/m  , BMI Body mass index is 20.14 kg/m. GENERAL:  Well appearing NECK:  No jugular venous distention, waveform within normal limits, carotid upstroke brisk and symmetric, no bruits, no thyromegaly LUNGS:  Clear to auscultation bilaterally CHEST:  Unremarkable HEART:  PMI not displaced or sustained,S1 and S2 within  normal limits, no S3, no S4, no clicks, no rubs, no murmurs ABD:  Flat, positive bowel sounds normal in frequency in pitch, no bruits, no rebound, no guarding, no midline pulsatile mass, no hepatomegaly, no splenomegaly EXT:  2 plus pulses throughout, no edema, no cyanosis no clubbing   EKG:  EKG is ordered today. The ekg ordered today demonstrates NSR, rate 84, axis within normal limits, intervals within normal limits, no acute ST-T wave changes.   Recent Labs: 12/10/2018: ALT 18; BUN 16; Creatinine, Ser 0.58; Hemoglobin 11.3; Platelets 255; Potassium 3.9; Sodium 141    Lipid Panel    Component Value Date/Time   CHOL 166 07/06/2014 1632   TRIG 272 (H) 07/06/2014 1632   HDL 38 (L) 07/06/2014 1632   CHOLHDL 4.4 07/06/2014 1632   VLDL 54 (H) 07/06/2014 1632   LDLCALC 74 07/06/2014 1632      Wt Readings from Last 3 Encounters:  06/08/19 121 lb (54.9 kg)  04/24/19 122 lb (55.3 kg)  12/10/18 120 lb (54.4 kg)      Other studies Reviewed: Additional studies/ records that were reviewed today include: None. Review of the above records demonstrates:  Please see elsewhere in the note.     ASSESSMENT AND PLAN:  HTN:   Today I am going to increase her amlodipine to 10 mg daily.   She is going to continue to have her hydralazine as needed.  Keep a blood pressure diary.  Of note she did have a TSH done yesterday.  DM: Her A1c was 8.2.  She is going to see an endocrinologist for the first time for management of this.    ANXIETY: She has this actively managed by psychiatry and we talked about this for quite a while.  She understands that relationship between anxiety and HTN.  I am glad this is being managed actively.     Current medicines are reviewed at length with the patient today.  The patient does not have concerns regarding medicines.  The following changes have been made:   As above  Labs/ tests ordered today include:  No orders of the defined types were placed in this  encounter.    Disposition:   FU with me in 3 months in the office.     Signed, Rollene RotundaJames Anay Rathe, MD  06/08/2019 5:05 PM    [facility]

## 2019-06-08 ENCOUNTER — Other Ambulatory Visit: Payer: Self-pay

## 2019-06-08 ENCOUNTER — Ambulatory Visit: Payer: Medicare Other | Admitting: Cardiology

## 2019-06-08 ENCOUNTER — Encounter: Payer: Self-pay | Admitting: Cardiology

## 2019-06-08 VITALS — BP 140/78 | HR 84 | Temp 97.8°F | Ht 65.0 in | Wt 121.0 lb

## 2019-06-08 DIAGNOSIS — I10 Essential (primary) hypertension: Secondary | ICD-10-CM

## 2019-06-08 DIAGNOSIS — Z7189 Other specified counseling: Secondary | ICD-10-CM | POA: Diagnosis not present

## 2019-06-08 MED ORDER — AMLODIPINE BESYLATE 10 MG PO TABS: 10.0000 mg | ORAL_TABLET | Freq: Every day | ORAL | 3 refills | Status: DC

## 2019-06-08 NOTE — Patient Instructions (Signed)
Medication Instructions:  INCREASE- Amlodipine 10 mg daily  If you need a refill on your cardiac medications before your next appointment, please call your pharmacy.  Labwork: None Ordered   Testing/Procedures: None Ordered  Follow-Up: You will need a follow up appointment in 3 months.  Please call our office 2 months in advance to schedule this appointment.  You may see Minus Breeding, MD or one of the following Advanced Practice Providers on your designated Care Team:   Rosaria Ferries, PA-C . Jory Sims, DNP, ANP      At [facility], you and your health needs are our priority.  As part of our continuing mission to provide you with exceptional heart care, we have created designated Provider Care Teams.  These Care Teams include your primary Cardiologist (physician) and Advanced Practice Providers (APPs -  Physician Assistants and Nurse Practitioners) who all work together to provide you with the care you need, when you need it.  Thank you for choosing [facility] at [facility]!!

## 2019-06-12 ENCOUNTER — Other Ambulatory Visit (INDEPENDENT_AMBULATORY_CARE_PROVIDER_SITE_OTHER): Payer: Medicare Other

## 2019-06-12 DIAGNOSIS — I10 Essential (primary) hypertension: Secondary | ICD-10-CM | POA: Diagnosis not present

## 2019-06-12 LAB — CYTOLOGY - PAP
Diagnosis: NEGATIVE
HPV: NOT DETECTED

## 2019-06-16 ENCOUNTER — Ambulatory Visit: Payer: Medicare Other | Admitting: Cardiology

## 2019-08-22 ENCOUNTER — Ambulatory Visit: Payer: Medicare Other | Admitting: Neurology

## 2019-08-22 NOTE — Progress Notes (Deleted)
[facility]    Provider:  Dr Jaynee Eagles Requesting Provider: Radene Ou Bill Salinas, MD Primary Care Provider:  Aretta Nip, MD  CC:  ***  HPI:  Jaclyn [name] is a 63 y.o. female here as requested by Rankins, Bill Salinas, MD for balance.  Past medical history pain syndrome of the right knee, primary insomnia, hypertension, type 2 diabetes with long-term use of insulin, hypertension, depression, hypercholesterolemia.  Reviewed notes, labs and imaging from outside physicians, which showed ***  I reviewed her ENT notes, she was sent by Dr. Radene Ou with several issues, mucus feeling in the throat, thought to be sinusitis, headaches across the temple and up to the top of the head, bilateral ear pain that radiates down along the sternocleidomastoid on both muscles, no dysphasia or odynophagia, multiple allergy over the counters none seem to have made a difference.  Examination showed some tenderness in the tail of the parotid glands, slight nodularity, TMJ tender bilaterally which does reproduce discomfort, postnasal drip which may be reflux.  Also diagnosed with sial adenitis  No notes from Dr. Radene Ou were included in the referral, I will request last office visits.  I reviewed x-ray report of the lumbar spine from 2012 which showed multilevel mild spondylosis.  Review of Systems: Patient complains of symptoms per HPI as well as the following symptoms ***. Pertinent negatives and positives per HPI. All others negative.   Social History   Socioeconomic History  . Marital status: Single    Spouse name: Not on file  . Number of children: Not on file  . Years of education: Not on file  . Highest education level: Not on file  Occupational History  . Not on file  Social Needs  . Financial resource strain: Not on file  . Food insecurity    Worry: Not on file    Inability: Not on file  . Transportation needs    Medical: Not on file    Non-medical: Not on file  Tobacco  Use  . Smoking status: Never Smoker  . Smokeless tobacco: Never Used  Substance and Sexual Activity  . Alcohol use: No  . Drug use: No  . Sexual activity: Not Currently  Lifestyle  . Physical activity    Days per week: Not on file    Minutes per session: Not on file  . Stress: Not on file  Relationships  . Social Herbalist on phone: Not on file    Gets together: Not on file    Attends religious service: Not on file    Active member of club or organization: Not on file    Attends meetings of clubs or organizations: Not on file    Relationship status: Not on file  . Intimate partner violence    Fear of current or ex partner: Not on file    Emotionally abused: Not on file    Physically abused: Not on file    Forced sexual activity: Not on file  Other Topics Concern  . Not on file  Social History Narrative   Lives with daughter    Family History  Problem Relation Age of Onset  . Huntington's disease Father     Past Medical History:  Diagnosis Date  . Anxiety   . Arthritis   . Depression   . Diabetes mellitus without complication ([place])   . Hypertension     Patient Active Problem List   Diagnosis Date Noted  . Educated About Covid-19 Virus  Infection 04/24/2019  . Bipolar depression ([facility]) 07/07/2013  . Delirium, drug-induced ([facility]) 11/10/2012  . Drug overdose, multiple drugs 11/09/2012  . Suicidal behavior 11/09/2012  . DM type 2 (diabetes mellitus, type 2) ([facility]) 11/09/2012  . Abnormal EKG 11/09/2012  . 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 Hypertension 11/09/2012  . Depression     Past Surgical History:  Procedure Laterality Date  . NASAL SEPTUM SURGERY    . TUBAL LIGATION      Current Outpatient Medications  Medication Sig Dispense Refill  . amLODipine (NORVASC) 10 MG tablet Take 1 tablet (10 mg total) by mouth daily. 180 tablet 3  . busPIRone (BUSPAR) 10 MG tablet Take 10 mg by mouth 2 (two) times daily.  1  . cetirizine (ZYRTEC) 10 MG  chewable tablet Chew 10 mg by mouth daily.    . enalapril (VASOTEC) 10 MG tablet Take 1 tablet by mouth 2 (two) times a day.    Marland Kitchen. glipiZIDE (GLUCOTROL) 10 MG tablet Take 1 tablet (10 mg total) by mouth daily. 90 tablet 3  . glucose blood test strip Use to check blood sugar twice daily (Patient taking differently: 1 each by Other route as needed (blood sugar). One touch verio--Use to check blood sugar twice daily) 100 each 5  . hydrALAZINE (APRESOLINE) 10 MG tablet Take 1 tablet (10 mg total) by mouth every 8 (eight) hours as needed. For blood pressure over 180 30 tablet 6  . ibuprofen (ADVIL,MOTRIN) 600 MG tablet Take 1 tablet (600 mg total) by mouth every 6 (six) hours as needed. 30 tablet 0  . Insulin Detemir (LEVEMIR FLEXTOUCH) 100 UNIT/ML Pen Inject 28 Units into the skin daily at 10 pm. (Patient taking differently: Inject 36 Units into the skin daily at 10 pm. ) 9 mL 0  . Insulin Pen Needle (BD PEN NEEDLE NANO U/F) 32G X 4 MM MISC Test daily. 100 each 5  . metFORMIN (GLUCOPHAGE) 1000 MG tablet TAKE ONE TABLET BY MOUTH TWICE DAILY WITH  A  MEAL (Patient taking differently: Take 1,000 mg by mouth 2 (two) times daily with a meal. ) 180 tablet 3  . Multiple Vitamin (MULTIVITAMIN WITH MINERALS) TABS tablet Take 1 tablet by mouth daily.    . risperiDONE (RISPERDAL) 0.5 MG tablet TAKE ONE TABLET BY MOUTH TWICE DAILY FOR MOOD  CONTROL. (Patient taking differently: Take 0.5 mg by mouth 2 (two) times daily. ) 180 tablet 0  . SERTRALINE HCL PO Take 200 mg by mouth daily.      No current facility-administered medications for this visit.     Allergies as of 08/23/2019 - Review Complete 06/08/2019  Allergen Reaction Noted  . Codeine Nausea And Vomiting 11/09/2012  . Flagyl [metronidazole] Other (See Comments) 06/09/2013  . Morphine and related Nausea And Vomiting 11/09/2012  . Remeron [mirtazapine] Other (See Comments) 06/09/2013    Vitals: There were no vitals taken for this visit. Last Weight:   Wt Readings from Last 1 Encounters:  06/08/19 121 lb (54.9 kg)   Last Height:   Ht Readings from Last 1 Encounters:  06/08/19 5\' 5"  (1.651 m)     Physical exam: Exam: Gen: NAD, conversant, well nourised, obese, well groomed                     CV: RRR, no MRG. No Carotid Bruits. No peripheral edema, warm, nontender Eyes: Conjunctivae clear without exudates or hemorrhage  Neuro: Detailed Neurologic Exam  Speech:  Speech is normal; fluent and spontaneous with normal comprehension.  Cognition:    The patient is oriented to person, place, and time;     recent and remote memory intact;     language fluent;     normal attention, concentration,     fund of knowledge Cranial Nerves:    The pupils are equal, round, and reactive to light. The fundi are normal and spontaneous venous pulsations are present. Visual fields are full to finger confrontation. Extraocular movements are intact. Trigeminal sensation is intact and the muscles of mastication are normal. The face is symmetric. The palate elevates in the midline. Hearing intact. Voice is normal. Shoulder shrug is normal. The tongue has normal motion without fasciculations.   Coordination:    Normal finger to nose and heel to shin. Normal rapid alternating movements.   Gait:    Heel-toe and tandem gait are normal.   Motor Observation:    No asymmetry, no atrophy, and no involuntary movements noted. Tone:    Normal muscle tone.    Posture:    Posture is normal. normal erect    Strength:    Strength is V/V in the upper and lower limbs.      Sensation: intact to LT     Reflex Exam:  DTR's:    Deep tendon reflexes in the upper and lower extremities are normal bilaterally.   Toes:    The toes are downgoing bilaterally.   Clonus:    Clonus is absent.    Assessment/Plan:    No orders of the defined types were placed in this encounter.  No orders of the defined types were placed in this encounter.   Cc: Rankins,  Fanny Dance, MD,  Rankins, Fanny Dance, MD  Naomie Dean, MD  [facility] [address] [place], [place] [address]  Phone [phone] Fax [phone]

## 2019-08-23 ENCOUNTER — Ambulatory Visit: Payer: Medicare Other | Admitting: Neurology

## 2019-08-24 ENCOUNTER — Encounter: Payer: Self-pay | Admitting: Neurology

## 2019-08-29 ENCOUNTER — Ambulatory Visit: Payer: Medicare Other | Admitting: Cardiology

## 2019-09-21 ENCOUNTER — Encounter: Payer: Self-pay | Admitting: Registered"

## 2019-09-21 ENCOUNTER — Other Ambulatory Visit: Payer: Self-pay

## 2019-09-21 ENCOUNTER — Encounter: Payer: Medicare Other | Attending: Endocrinology | Admitting: Registered"

## 2019-09-21 DIAGNOSIS — E1165 Type 2 diabetes mellitus with hyperglycemia: Secondary | ICD-10-CM | POA: Diagnosis not present

## 2019-09-21 NOTE — Patient Instructions (Addendum)
Due to acid reflux may want to consider cutting back on coffee. If replacing with tea look for ones that are caffeine free.  Aim to eat balanced meals and snacks. Reduce oatmeal by 1 packet. Measure pasta and check BG after meal.  Continue checking your fasting blood sugar. Consider changing the time check you afternoon blood sugar to see how lunch is affecting your blood sugar 2 hours after your meal.  Look into getting a meter that can sync with your phone if interested. Accu-chek Guide or Guide me has a convenient lancing device with 6 needles per drum.

## 2019-09-21 NOTE — Progress Notes (Signed)
Diabetes Self-Management Education  Visit Type: First/Initial  Appt. Start Time: 1045 Appt. End Time: 1215  09/21/2019  [name], identified by name and date of birth, is a 63 y.o. female with a diagnosis of Diabetes: Type 2.   This patient is accompanied in the office by her daughter who also lives with her.   ASSESSMENT  Height _0  (1.6 m), weight 128 lb (58.1 kg). Body mass index is 22.67 kg/m.  Patient states she wants to learn what she can do to bring down her blood sugar. Also states she wants to understand how to read food labels.  Medications: Metformin since 2006, glipizide ~5 yrs, Levemir 10u morning, 26u evening.  SMBG: Patient reported - FBG avg 120 mg/dL; pre-meal 3-4 pm about 3 hrs since last meal runs 250-290 mg/dL  Diet: No sugar-sweetened beverages, doesn't drink straight water, makes her nauseated.  Pt reports she has a lot of stomach acid, nausea and med started about 1 month ago helps.  If patient decides to return for follow-up, patient states she would be interested in learning about the action of her medications.  Diabetes Self-Management Education - 09/21/19 1055      Visit Information   Visit Type  First/Initial      Initial Visit   Diabetes Type  Type 2    Are you currently following a meal plan?  No    Are you taking your medications as prescribed?  Yes    Date Diagnosed  2006      Health Coping   How would you rate your overall health?  Fair      Psychosocial Assessment   Patient Belief/Attitude about Diabetes  Defeat/Burnout    What is the last grade level you completed in school?  associates degree      Complications   Last HgB A1C per patient/outside source  7.8 %   per referral labs   How often do you check your blood sugar?  1-2 times/day    Fasting Blood glucose range (mg/dL)  70-129    Postprandial Blood glucose range (mg/dL)  >200    Number of hypoglycemic episodes per month  2    Can you tell when your blood sugar is  low?  Yes    What do you do if your blood sugar is low?  regular coke    Number of hyperglycemic episodes per week  --   daily   Have you had a dilated eye exam in the past 12 months?  Yes    Have you had a dental exam in the past 12 months?  Yes    Are you checking your feet?  Yes    How many days per week are you checking your feet?  7      Dietary Intake   Breakfast  2 oatmeal, GV maple br sugar, 5 sausage links    Snack (morning)  bacon, biscuit, diet coke, coffee    Lunch  grilled cheese OR potato salad, cole slaw OR taco salad with pinto beans    Snack (afternoon)  popcorn OR fruit    Dinner  mac & cheese, or chicken pot pie OR spaghetti, prego chuncky vegetable, hamburger    Snack (evening)  none OR carb smart ice cream    Beverage(s)  sugar free gatorade, coffee, diet coke, sparkling water      Exercise   Exercise Type  Light (walking / raking leaves)    How many days per week  to you exercise?  5    How many minutes per day do you exercise?  30    Total minutes per week of exercise  150      Patient Education   Previous Diabetes Education  Yes (please comment)   at dx 2006   Disease state   Definition of diabetes, type 1 and 2, and the diagnosis of diabetes    Nutrition management   Role of diet in the treatment of diabetes and the relationship between the three main macronutrients and blood glucose level;Food label reading, portion sizes and measuring food.    Monitoring  Identified appropriate SMBG and/or A1C goals.      Individualized Goals (developed by patient)   Nutrition  General guidelines for healthy choices and portions discussed    Monitoring   test my blood glucose as discussed      Outcomes   Expected Outcomes  Demonstrated interest in learning. Expect positive outcomes    Future DMSE  PRN    Program Status  Completed       Individualized Plan for Diabetes Self-Management Training:   Learning Objective:  Patient will have a greater understanding of  diabetes self-management. Patient education plan is to attend individual and/or group sessions per assessed needs and concerns.    Patient Instructions  Due to acid reflux may want to consider cutting back on coffee. If replacing with tea look for ones that are caffeine free.  Aim to eat balanced meals and snacks. Reduce oatmeal by 1 packet. Measure pasta and check BG after meal.  Continue checking your fasting blood sugar. Consider changing the time check you afternoon blood sugar to see how lunch is affecting your blood sugar 2 hours after your meal.  Look into getting a meter that can sync with your phone if interested. Accu-chek Guide or Guide me has a convenient lancing device with 6 needles per drum.  Expected Outcomes:  Demonstrated interest in learning. Expect positive outcomes  Education material provided: ADA - How to Thrive: A Guide for Your Journey with Diabetes, A1C conversion sheet, Snack sheet and Carbohydrate counting sheet  If problems or questions, patient to contact team via:  Phone and MyChart  Future DSME appointment: PRN

## 2019-10-03 ENCOUNTER — Ambulatory Visit: Payer: Medicare Other | Admitting: Registered"

## 2020-02-26 ENCOUNTER — Encounter (HOSPITAL_COMMUNITY): Payer: Self-pay

## 2020-02-26 ENCOUNTER — Other Ambulatory Visit: Payer: Self-pay

## 2020-02-26 ENCOUNTER — Emergency Department (HOSPITAL_COMMUNITY)
Admission: EM | Admit: 2020-02-26 | Discharge: 2020-02-26 | Disposition: A | Discharge status: Home / Self Care | Payer: Medicare Other | Attending: Emergency Medicine | Admitting: Emergency Medicine

## 2020-02-26 DIAGNOSIS — E119 Type 2 diabetes mellitus without complications: Secondary | ICD-10-CM | POA: Diagnosis not present

## 2020-02-26 DIAGNOSIS — I10 Essential (primary) hypertension: Secondary | ICD-10-CM | POA: Insufficient documentation

## 2020-02-26 DIAGNOSIS — F329 Major depressive disorder, single episode, unspecified: Secondary | ICD-10-CM

## 2020-02-26 DIAGNOSIS — F32A Depression, unspecified: Secondary | ICD-10-CM

## 2020-02-26 DIAGNOSIS — R45851 Suicidal ideations: Secondary | ICD-10-CM | POA: Diagnosis not present

## 2020-02-26 DIAGNOSIS — Z79899 Other long term (current) drug therapy: Secondary | ICD-10-CM | POA: Insufficient documentation

## 2020-02-26 DIAGNOSIS — F39 Unspecified mood [affective] disorder: Secondary | ICD-10-CM | POA: Diagnosis not present

## 2020-02-26 DIAGNOSIS — Z794 Long term (current) use of insulin: Secondary | ICD-10-CM | POA: Diagnosis not present

## 2020-02-26 DIAGNOSIS — F319 Bipolar disorder, unspecified: Secondary | ICD-10-CM | POA: Insufficient documentation

## 2020-02-26 LAB — URINALYSIS, ROUTINE W REFLEX MICROSCOPIC
Bilirubin Urine: NEGATIVE
Glucose, UA: 150 mg/dL — AB
Hgb urine dipstick: NEGATIVE
Ketones, ur: NEGATIVE mg/dL
Nitrite: NEGATIVE
Protein, ur: NEGATIVE mg/dL
Specific Gravity, Urine: 1.012 (ref 1.005–1.030)
pH: 6 (ref 5.0–8.0)

## 2020-02-26 LAB — CBC WITH DIFFERENTIAL/PLATELET
Abs Immature Granulocytes: 0.03 10*3/uL (ref 0.00–0.07)
Basophils Absolute: 0.1 10*3/uL (ref 0.0–0.1)
Basophils Relative: 1 %
Eosinophils Absolute: 0.3 10*3/uL (ref 0.0–0.5)
Eosinophils Relative: 4 %
HCT: 36.9 % (ref 36.0–46.0)
Hemoglobin: 11.5 g/dL — ABNORMAL LOW (ref 12.0–15.0)
Immature Granulocytes: 0 %
Lymphocytes Relative: 24 %
Lymphs Abs: 1.7 10*3/uL (ref 0.7–4.0)
MCH: 27.4 pg (ref 26.0–34.0)
MCHC: 31.2 g/dL (ref 30.0–36.0)
MCV: 87.9 fL (ref 80.0–100.0)
Monocytes Absolute: 0.5 10*3/uL (ref 0.1–1.0)
Monocytes Relative: 7 %
Neutro Abs: 4.6 10*3/uL (ref 1.7–7.7)
Neutrophils Relative %: 64 %
Platelets: 262 10*3/uL (ref 150–400)
RBC: 4.2 MIL/uL (ref 3.87–5.11)
RDW: 14.2 % (ref 11.5–15.5)
WBC: 7.1 10*3/uL (ref 4.0–10.5)
nRBC: 0 % (ref 0.0–0.2)

## 2020-02-26 LAB — COMPREHENSIVE METABOLIC PANEL
ALT: 24 U/L (ref 0–44)
AST: 17 U/L (ref 15–41)
Albumin: 4.1 g/dL (ref 3.5–5.0)
Alkaline Phosphatase: 50 U/L (ref 38–126)
Anion gap: 9 (ref 5–15)
BUN: 11 mg/dL (ref 8–23)
CO2: 27 mmol/L (ref 22–32)
Calcium: 9.1 mg/dL (ref 8.9–10.3)
Chloride: 101 mmol/L (ref 98–111)
Creatinine, Ser: 0.51 mg/dL (ref 0.44–1.00)
GFR calc Af Amer: >60 mL/min (ref >60)
GFR calc non Af Amer: >60 mL/min (ref >60)
Glucose, Bld: 196 mg/dL — ABNORMAL HIGH (ref 70–99)
Potassium: 3.8 mmol/L (ref 3.5–5.1)
Sodium: 137 mmol/L (ref 135–145)
Total Bilirubin: 0.3 mg/dL (ref 0.3–1.2)
Total Protein: 7.2 g/dL (ref 6.5–8.1)

## 2020-02-26 LAB — ETHANOL: Alcohol, Ethyl (B): <10 mg/dL (ref <10)

## 2020-02-26 LAB — RAPID URINE DRUG SCREEN, HOSP PERFORMED
Amphetamines: NOT DETECTED
Barbiturates: NOT DETECTED
Benzodiazepines: NOT DETECTED
Cocaine: NOT DETECTED
Opiates: NOT DETECTED
Tetrahydrocannabinol: NOT DETECTED

## 2020-02-26 MED ORDER — RISPERIDONE 1 MG PO TABS: 1.0000 mg | ORAL_TABLET | Freq: Every day | ORAL | Status: DC

## 2020-02-26 MED ORDER — BUSPIRONE HCL 10 MG PO TABS: 10.0000 mg | ORAL_TABLET | Freq: Two times a day (BID) | ORAL | Status: DC

## 2020-02-26 MED ORDER — ADULT MULTIVITAMIN W/MINERALS CH: 1.0000 | ORAL_TABLET | Freq: Every day | ORAL | Status: DC

## 2020-02-26 MED ORDER — LORATADINE 10 MG PO TABS: 10.0000 mg | ORAL_TABLET | Freq: Every day | ORAL | Status: DC

## 2020-02-26 MED ORDER — HYDRALAZINE HCL 10 MG PO TABS: 10.0000 mg | ORAL_TABLET | Freq: Three times a day (TID) | ORAL | Status: DC | PRN

## 2020-02-26 MED ORDER — GLIPIZIDE 10 MG PO TABS
10.0000 mg | ORAL_TABLET | Freq: Every day | ORAL | Status: DC
  Filled 2020-02-26: qty 1

## 2020-02-26 MED ORDER — AMLODIPINE BESYLATE 5 MG PO TABS: 20.0000 mg | ORAL_TABLET | Freq: Every day | ORAL | Status: DC

## 2020-02-26 MED ORDER — ZOLPIDEM TARTRATE 5 MG PO TABS: 5.0000 mg | ORAL_TABLET | Freq: Every evening | ORAL | Status: DC | PRN

## 2020-02-26 MED ORDER — INSULIN DETEMIR 100 UNIT/ML ~~LOC~~ SOLN
20.0000 [IU] | Freq: Every day | SUBCUTANEOUS | Status: DC
  Administered 2020-02-26: 20 [IU] via SUBCUTANEOUS
  Filled 2020-02-26: qty 0.2

## 2020-02-26 MED ORDER — ENALAPRIL MALEATE 10 MG PO TABS: 20.0000 mg | ORAL_TABLET | Freq: Every day | ORAL | Status: DC

## 2020-02-26 MED ORDER — ONDANSETRON HCL 4 MG PO TABS: 4.0000 mg | ORAL_TABLET | Freq: Three times a day (TID) | ORAL | Status: DC | PRN

## 2020-02-26 MED ORDER — IBUPROFEN 200 MG PO TABS: 600.0000 mg | ORAL_TABLET | Freq: Four times a day (QID) | ORAL | Status: DC | PRN

## 2020-02-26 MED ORDER — ALUM & MAG HYDROXIDE-SIMETH 200-200-20 MG/5ML PO SUSP: 30.0000 mL | Freq: Four times a day (QID) | ORAL | Status: DC | PRN

## 2020-02-26 MED ORDER — SERTRALINE HCL 50 MG PO TABS: 200.0000 mg | ORAL_TABLET | Freq: Every day | ORAL | Status: DC

## 2020-02-26 MED ORDER — RISPERIDONE 0.5 MG PO TABS: 0.5000 mg | ORAL_TABLET | Freq: Every day | ORAL | Status: DC

## 2020-02-26 MED ORDER — GLUCOSE BLOOD VI STRP: 1.0000 | ORAL_STRIP | Status: DC | PRN

## 2020-02-26 MED ORDER — RISPERIDONE 0.5 MG PO TABS: 0.5000 mg | ORAL_TABLET | ORAL | Status: DC

## 2020-02-26 MED ORDER — CHLORHEXIDINE GLUCONATE 0.12 % MT SOLN
15.0000 mL | Freq: Two times a day (BID) | OROMUCOSAL | Status: DC | PRN
  Filled 2020-02-26: qty 15

## 2020-02-26 MED ORDER — INSULIN DETEMIR 100 UNIT/ML ~~LOC~~ SOLN
30.0000 [IU] | Freq: Every day | SUBCUTANEOUS | Status: DC
  Filled 2020-02-26: qty 0.3

## 2020-02-26 MED ORDER — METFORMIN HCL 500 MG PO TABS: 1000.0000 mg | ORAL_TABLET | Freq: Two times a day (BID) | ORAL | Status: DC

## 2020-02-26 NOTE — BH Assessment (Signed)
Assessment Note  Jaclyn [name] is an 64 y.o. female that presents this date with concerns over frequent mood swings. Patient is currently receiving services from a OP provider at [facility] ([place] NP) who assists with medication management for ongoing symptoms associated with a mood disorder. Patient initially reported she was having thoughts of self harm per note review this date although denies at the time of assessment. Patient denies any prior attempts or gestures at self harm. Patient denies any history of SA use. Patient states she presented to her provider last Tuesday to address current concerns and medication changes were made at that time. Patient reports she was hesitant the first 3 or 4 days after that  to implement the recommended changes hoping the symptoms would self resolve. Patient states she continued to experience extreme mood swings that consisted of "being really up or down" that rapidly cycled daily without warning. Patient cannot identify any immediate stressors associated with those mood swings. Patient's daughter is present who provides collateral information Jaclyn [name] [phone]) and states she currently resides with her mother and has noticed the mood swings started about two weeks ago reporting her mother experiences these swings several times a day and when you talk with her "you really don't know what to expect." Patient's daughter did not express any concerns in reference to patient harming herself or others. Patient stated she did start the recommended changes that her provider suggested two days ago after symptoms did not subside. Patient is vague in reference to detailing mood swings stating she is "either up or down" several times throughout the day. Patient reports her sleep patterns have not changed since the onset of reported symptoms stating she gets about 8 hours of sleep nightly. Patient is requesting resources this date so she can start some  counseling. Patient reports she "just needs someone to talk to at times." Patient is contracting for safety and denies any S/I, H/I or AVH. Patient is oriented x 4 and presents with a pleasant affect. Patient's memory is intact and thoughts organized. Patient does not appear to be responding to internal stimuli. Case was staffed with Rankin NP who also evaluated patient and agreed to discharge with counseling resources provided.   Diagnosis: Mood disorder  Past Medical History:  Past Medical History:  Diagnosis Date  . Anxiety   . Arthritis   . Depression   . Diabetes mellitus without complication ([place])   . Hypertension     Past Surgical History:  Procedure Laterality Date  . NASAL SEPTUM SURGERY    . TUBAL LIGATION      Family History:  Family History  Problem Relation Age of Onset  . Huntington's disease Father     Social History:  reports that she has never smoked. She has never used smokeless tobacco. She reports that she does not drink alcohol or use drugs.  Additional Social History:  Alcohol / Drug Use Pain Medications: See MAR Prescriptions: See MAR Over the Counter: See MAR History of alcohol / drug use?: No history of alcohol / drug abuse  CIWA: CIWA-Ar BP: (!) 150/74 Pulse Rate: 77 COWS:    Allergies:  Allergies  Allergen Reactions  . Codeine Nausea And Vomiting    States that she cannot take in high doses  . Flagyl [Metronidazole] Other (See Comments)    Fast heartbeat,tingly  . Metronidazole Benzo+Syrspend   . Morphine And Related Nausea And Vomiting    States that she cannot take in high  doses  . Remeron [Mirtazapine] Other (See Comments)    Severe restlessness,irritable,hyperactivity,agitation    Home Medications: (Not in a hospital admission)   OB/GYN Status:  No LMP recorded. Patient is postmenopausal.  General Assessment Data Location of Assessment: [facility] TTS Assessment: In system Is this a Tele or Face-to-Face Assessment?:  Face-to-Face Is this an Initial Assessment or a Re-assessment for this encounter?: Initial Assessment Patient Accompanied by:: N/A Language Other than English: No Living Arrangements: Other (Comment) What gender do you identify as?: Female Marital status: Single Living Arrangements: Children Can pt return to current living arrangement?: Yes Admission Status: Voluntary Is patient capable of signing voluntary admission?: Yes Referral Source: Self/Family/Friend Insurance type: Research officer, trade union Exam [facility] Walk-in ONLY) Medical Exam completed: Yes  Crisis Care Plan Living Arrangements: Children Legal Guardian: (NA) Name of Psychiatrist: None Name of Therapist: None  Education Status Is patient currently in school?: No Is the patient employed, unemployed or receiving disability?: Unemployed  Risk to self with the past 6 months Suicidal Ideation: No Has patient been a risk to self within the past 6 months prior to admission? : No Suicidal Intent: No Has patient had any suicidal intent within the past 6 months prior to admission? : No Is patient at risk for suicide?: No, but patient needs Medical Clearance Suicidal Plan?: No Has patient had any suicidal plan within the past 6 months prior to admission? : No Access to Means: No What has been your use of drugs/alcohol within the last 12 months?: Denies Previous Attempts/Gestures: No How many times?: 0 Other Self Harm Risks: (NA) Triggers for Past Attempts: (NA) Intentional Self Injurious Behavior: None Family Suicide History: No Recent stressful life event(s): Other (Comment)(Recent medications changes) Persecutory voices/beliefs?: No Depression: No Depression Symptoms: (Denies) Substance abuse history and/or treatment for substance abuse?: No Suicide prevention information given to non-admitted patients: Not applicable  Risk to Others within the past 6 months Homicidal Ideation: No Does patient have any  lifetime risk of violence toward others beyond the six months prior to admission? : No Thoughts of Harm to Others: No Current Homicidal Intent: No Current Homicidal Plan: No Access to Homicidal Means: No Identified Victim: NA History of harm to others?: No Assessment of Violence: None Noted Violent Behavior Description: NA Does patient have access to weapons?: No Criminal Charges Pending?: No Does patient have a court date: No Is patient on probation?: No  Psychosis Hallucinations: None noted Delusions: None noted  Mental Status Report Appearance/Hygiene: Unremarkable Eye Contact: Good Motor Activity: Freedom of movement Speech: Logical/coherent Level of Consciousness: Alert Mood: Pleasant Affect: Appropriate to circumstance Anxiety Level: Minimal Thought Processes: Coherent, Relevant Judgement: Partial Orientation: Person, Place, Time Obsessive Compulsive Thoughts/Behaviors: None  Cognitive Functioning Concentration: Normal Memory: Recent Intact, Remote Intact Is patient IDD: No Insight: Good Impulse Control: Good Appetite: Good Have you had any weight changes? : No Change Sleep: No Change Total Hours of Sleep: 7 Vegetative Symptoms: None  ADLScreening [facility] Assessment Services) Patient's cognitive ability adequate to safely complete daily activities?: Yes Patient able to express need for assistance with ADLs?: Yes Independently performs ADLs?: Yes (appropriate for developmental age)  Prior Inpatient Therapy Prior Inpatient Therapy: No  Prior Outpatient Therapy Prior Outpatient Therapy: No Does patient have an ACCT team?: No Does patient have Intensive In-House Services?  : No Does patient have [facility] services? : No Does patient have P4CC services?: No  ADL Screening (condition at time of admission) Patient's cognitive ability adequate to safely  complete daily activities?: Yes Is the patient deaf or have difficulty hearing?: No Does the patient have  difficulty seeing, even when wearing glasses/contacts?: No Does the patient have difficulty concentrating, remembering, or making decisions?: No Patient able to express need for assistance with ADLs?: Yes Does the patient have difficulty dressing or bathing?: No Independently performs ADLs?: Yes (appropriate for developmental age) Does the patient have difficulty walking or climbing stairs?: No Weakness of Legs: None Weakness of Arms/Hands: None  Home Assistive Devices/Equipment Home Assistive Devices/Equipment: None  Therapy Consults (therapy consults require a physician order) PT Evaluation Needed: No OT Evalulation Needed: No SLP Evaluation Needed: No Abuse/Neglect Assessment (Assessment to be complete while patient is alone) Abuse/Neglect Assessment Can Be Completed: Yes Physical Abuse: Denies Verbal Abuse: Denies Sexual Abuse: Denies Exploitation of patient/patient's resources: Denies Self-Neglect: Denies Values / Beliefs Cultural Requests During Hospitalization: None Spiritual Requests During Hospitalization: None Consults Spiritual Care Consult Needed: No Transition of Care Team Consult Needed: No Advance Directives (For Healthcare) Does Patient Have a Medical Advance Directive?: No Would patient like information on creating a medical advance directive?: No - Patient declined          Disposition: Case was staffed with Rankin NP who also evaluated patient and agreed to discharge with counseling resources provided.  Disposition Initial Assessment Completed for this Encounter: Yes Disposition of Patient: Discharge  On Site Evaluation by:   Reviewed with Physician:    Alfredia Ferguson 02/26/2020 12:48 PM

## 2020-02-26 NOTE — BH Assessment (Addendum)
[facility] Assessment Progress Note  Per Shuvon Rankin, FNP, this pt does not require psychiatric hospitalization at this time.  Pt is psychiatrically cleared.  Pt reportedly sees a psychiatrist at [facility], but would benefit from seeing a therapist as well.  The practice also offers therapy, and discharge instructions advise pt to schedule an appointment with a therapist there.  Pt's nurse has been notified.  Doylene Canning, MA Triage Specialist [phone]

## 2020-02-26 NOTE — Discharge Instructions (Addendum)
For your behavioral health needs, you are advised to continue treatment with your current provider.  The practice offers therapy as well as psychiatry.  Ask about scheduling an appointment with a therapist:       [facility]      [address]      [place], [place] [address]      [phone]

## 2020-02-26 NOTE — ED Notes (Signed)
Patient ambulatory to restroom without assistance-- patient and daughter state that patient has taken all of morning/daily medications excluding Levemir insulin.

## 2020-02-26 NOTE — ED Provider Notes (Signed)
[facility] DEPT Provider Note   CSN: [id number] Arrival date & time: 02/26/20  [id number]     History Chief Complaint  Patient presents with  . Psychiatric Evaluation  . Suicidal    Jaclyn [name] is a 64 y.o. female.  HPI Patient presents to the emergency department with increasing depression and anxiety.  Patient states that she has had some thoughts of harming herself.  Patient states that he had these episodes where she will be feeling okay and then also described very angry and yelling..  The daughter states that she had her Risperdal changed to 0.5 mg in the morning to 1 mg at night.  The patient denies chest pain, shortness of breath, headache,blurred vision, neck pain, fever, cough, weakness, numbness, dizziness, anorexia, edema, abdominal pain, nausea, vomiting, diarrhea, rash, back pain, dysuria, hematemesis, bloody stool, near syncope, or syncope.    Past Medical History:  Diagnosis Date  . Anxiety   . Arthritis   . Depression   . Diabetes mellitus without complication ([place])   . Hypertension     Patient Active Problem List   Diagnosis Date Noted  . Educated about COVID-19 virus infection 04/24/2019  . Bipolar depression ([place]) 07/07/2013  . Delirium, drug-induced 11/10/2012  . Drug overdose, multiple drugs 11/09/2012  . Suicidal behavior 11/09/2012  . DM type 2 (diabetes mellitus, type 2) ([place]) 11/09/2012  . Abnormal EKG 11/09/2012  . 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 Hypertension 11/09/2012  . Depression     Past Surgical History:  Procedure Laterality Date  . NASAL SEPTUM SURGERY    . TUBAL LIGATION       OB History   No obstetric history on file.     Family History  Problem Relation Age of Onset  . Huntington's disease Father     Social History   Tobacco Use  . Smoking status: Never Smoker  . Smokeless tobacco: Never Used  Substance Use Topics  . Alcohol use: No  . Drug use: No    Home Medications  Prior to Admission medications   Medication Sig Start Date End Date Taking? Authorizing Provider  amLODipine (NORVASC) 10 MG tablet Take 1 tablet (10 mg total) by mouth daily. Patient taking differently: Take 20 mg by mouth daily.  06/08/19 02/26/20 Yes Minus Breeding, MD  busPIRone (BUSPAR) 10 MG tablet Take 10 mg by mouth 2 (two) times daily. 07/16/18  Yes [provider]  cetirizine (ZYRTEC) 10 MG chewable tablet Chew 10 mg by mouth daily.   Yes [provider]  chlorhexidine (PERIDEX) 0.12 % solution Use as directed 15 mLs in the mouth or throat 2 (two) times daily as needed (mouth soreness).   Yes [provider]  enalapril (VASOTEC) 10 MG tablet Take 20 mg by mouth daily.  03/18/19  Yes [provider]  glipiZIDE (GLUCOTROL) 10 MG tablet Take 1 tablet (10 mg total) by mouth daily. 04/06/15  Yes Colin Benton R, DO  hydrALAZINE (APRESOLINE) 10 MG tablet Take 1 tablet (10 mg total) by mouth every 8 (eight) hours as needed. For blood pressure over 180 04/24/19  Yes Hochrein, Jeneen Rinks, MD  ibuprofen (ADVIL,MOTRIN) 600 MG tablet Take 1 tablet (600 mg total) by mouth every 6 (six) hours as needed. 09/17/18  Yes Law, Alexandra M, PA-C  Insulin Detemir (LEVEMIR FLEXTOUCH) 100 UNIT/ML Pen Inject 28 Units into the skin daily at 10 pm. Patient taking differently: Inject 20-30 Units into the skin 2 (two) times daily.  20units in AM and 30 units hs 07/04/15  Yes Kriste Basque R, DO  metFORMIN (GLUCOPHAGE) 1000 MG tablet TAKE ONE TABLET BY MOUTH TWICE DAILY WITH  A  MEAL Patient taking differently: Take 1,000 mg by mouth 2 (two) times daily with a meal.  04/06/15  Yes Terressa Koyanagi, DO  Multiple Vitamin (MULTIVITAMIN WITH MINERALS) TABS tablet Take 1 tablet by mouth daily.   Yes [provider]  omeprazole (PRILOSEC) 40 MG capsule Take 40 mg by mouth daily. 01/01/20  Yes [provider]  risperiDONE (RISPERDAL) 0.5 MG tablet TAKE ONE TABLET BY MOUTH TWICE DAILY FOR MOOD   CONTROL. Patient taking differently: Take 0.5-1 mg by mouth See admin instructions. 0.5mg  in AM and 1mg  in evening 05/24/15  Yes 05/26/15 B, FNP  sertraline (ZOLOFT) 100 MG tablet Take 200 mg by mouth daily.  05/07/17  Yes [provider]  glucose blood test strip Use to check blood sugar twice daily Patient taking differently: 1 each by Other route as needed (blood sugar). One touch verio--Use to check blood sugar twice daily 02/27/15   04/29/15 B, FNP  Insulin Pen Needle (BD PEN NEEDLE NANO U/F) 32G X 4 MM MISC Test daily. 09/11/14   09/13/14, PA-C    Allergies    Codeine, Flagyl [metronidazole], Metronidazole benzo+syrspend, Morphine and related, and Remeron [mirtazapine]  Review of Systems   Review of Systems All other systems negative except as documented in the HPI. All pertinent positives and negatives as reviewed in the HPI. Physical Exam Updated Vital Signs BP (!) 150/74   Pulse 77   Temp 98.8 F (37.1 C) (Oral)   Resp 15   SpO2 97%   Physical Exam Vitals and nursing note reviewed.  Constitutional:      General: She is not in acute distress.    Appearance: She is well-developed.  HENT:     Head: Normocephalic and atraumatic.  Eyes:     Pupils: Pupils are equal, round, and reactive to light.  Cardiovascular:     Rate and Rhythm: Normal rate and regular rhythm.     Heart sounds: Normal heart sounds. No murmur. No friction rub. No gallop.   Pulmonary:     Effort: Pulmonary effort is normal. No respiratory distress.     Breath sounds: Normal breath sounds. No wheezing.  Abdominal:     General: Bowel sounds are normal. There is no distension.     Palpations: Abdomen is soft.     Tenderness: There is no abdominal tenderness.  Musculoskeletal:     Cervical back: Normal range of motion and neck supple.  Skin:    General: Skin is warm and dry.     Capillary Refill: Capillary refill takes less than 2 seconds.     Findings: No erythema or rash.   Neurological:     Mental Status: She is alert and oriented to person, place, and time.     Motor: No abnormal muscle tone.     Coordination: Coordination normal.  Psychiatric:        Behavior: Behavior normal.     ED Results / Procedures / Treatments   Labs (all labs ordered are listed, but only abnormal results are displayed) Labs Reviewed  COMPREHENSIVE METABOLIC PANEL - Abnormal; Notable for the following components:      Result Value   Glucose, Bld 196 (*)    All other components within normal limits  CBC WITH DIFFERENTIAL/PLATELET - Abnormal; Notable for the  following components:   Hemoglobin 11.5 (*)    All other components within normal limits  URINALYSIS, ROUTINE W REFLEX MICROSCOPIC - Abnormal; Notable for the following components:   Glucose, UA 150 (*)    Leukocytes,Ua LARGE (*)    Bacteria, UA RARE (*)    All other components within normal limits  ETHANOL  RAPID URINE DRUG SCREEN, HOSP PERFORMED    EKG None  Radiology No results found.  Procedures Procedures (including critical care time)  Medications Ordered in ED Medications  amLODipine (NORVASC) tablet 20 mg (20 mg Oral Not Given 02/26/20 1155)  busPIRone (BUSPAR) tablet 10 mg (10 mg Oral Not Given 02/26/20 1155)  loratadine (CLARITIN) tablet 10 mg (10 mg Oral Not Given 02/26/20 1155)  chlorhexidine (PERIDEX) 0.12 % solution 15 mL (has no administration in time range)  enalapril (VASOTEC) tablet 20 mg (has no administration in time range)  glipiZIDE (GLUCOTROL) tablet 10 mg (10 mg Oral Not Given 02/26/20 1156)  ibuprofen (ADVIL) tablet 600 mg (has no administration in time range)  insulin detemir (LEVEMIR) injection 20 Units (20 Units Subcutaneous Given 02/26/20 1213)  metFORMIN (GLUCOPHAGE) tablet 1,000 mg (1,000 mg Oral Not Given 02/26/20 1155)  sertraline (ZOLOFT) tablet 200 mg (200 mg Oral Not Given 02/26/20 1156)  multivitamin with minerals tablet 1 tablet (1 tablet Oral Not Given 02/26/20 1156)  zolpidem  (AMBIEN) tablet 5 mg (has no administration in time range)  ondansetron (ZOFRAN) tablet 4 mg (has no administration in time range)  alum & mag hydroxide-simeth (MAALOX/MYLANTA) 200-200-20 MG/5ML suspension 30 mL (has no administration in time range)  risperiDONE (RISPERDAL) tablet 0.5 mg (0.5 mg Oral Not Given 02/26/20 1156)  risperiDONE (RISPERDAL) tablet 1 mg (has no administration in time range)  hydrALAZINE (APRESOLINE) tablet 10 mg (has no administration in time range)  insulin detemir (LEVEMIR) injection 30 Units (has no administration in time range)    ED Course  I have reviewed the triage vital signs and the nursing notes.  Pertinent labs & imaging results that were available during my care of the patient were reviewed by me and considered in my medical decision making (see chart for details).   Patient will need TTS assessment for her increasing depression and anxiety along with the fleeting thoughts of suicidality.  Patient has been medically stable here in the emergency department. MDM Rules/Calculators/A&P                       Final Clinical Impression(s) / ED Diagnoses Final diagnoses:  None    Rx / DC Orders ED Discharge Orders    None       Charlestine Night, PA-C 02/26/20 1443    Terrilee Files, MD 02/26/20 [phone]

## 2020-02-26 NOTE — ED Triage Notes (Signed)
Patient reports she has been having thoughts of hurting herself and others.   Patient states she feels happy then all of a sudden will become angry with verbal words.   Daughter at bedside.   Per christopher PA patients daughter can be at bedside for now.   Daughter states patients prescription of Risperdal has changed to 0.5mg  in morning and 1 mg at night (changed last Tuesday)  Patient lives with daughter.

## 2020-02-26 NOTE — Consult Note (Signed)
  Patient seen with TTS via tele psych by this provider and Dr. Lucianne Muss.  For detailed not see TTS note.     Disposition:  Patient psychiatrically cleared No evidence of imminent risk to self or others at present.   Patient does not meet criteria for psychiatric inpatient admission. Supportive therapy provided about ongoing stressors. Discussed crisis plan, support from social network, calling 911, coming to the Emergency Department, and calling Suicide Hotline.

## 2020-03-01 ENCOUNTER — Other Ambulatory Visit: Payer: Self-pay

## 2020-03-01 MED ORDER — ENALAPRIL MALEATE 10 MG PO TABS: 20.0000 mg | ORAL_TABLET | Freq: Every day | ORAL | 1 refills | Status: DC

## 2020-03-23 ENCOUNTER — Ambulatory Visit: Payer: Medicare Other | Attending: Internal Medicine

## 2020-03-23 DIAGNOSIS — Z23 Encounter for immunization: Secondary | ICD-10-CM

## 2020-03-23 NOTE — Progress Notes (Signed)
   Covid-19 Vaccination Clinic  Name:  Jaclyn [name]    MRN: [id number] DOB: [birth year]  03/23/2020  Jaclyn [name] was observed post Covid-19 immunization for 15 minutes without incident. She was provided with Vaccine Information Sheet and instruction to access the V-Safe system.   Jaclyn [name] was instructed to call 911 with any severe reactions post vaccine: Marland Kitchen Difficulty breathing  . Swelling of face and throat  . A fast heartbeat  . A bad rash all over body  . Dizziness and weakness   Immunizations Administered    Name Date Dose VIS Date Route   Pfizer COVID-19 Vaccine 03/23/2020  2:57 PM 0.3 mL 12/08/2019 Intramuscular   Manufacturer: [employer], [employer]   Lot: [id number]   NDC: [address]-2

## 2020-04-17 ENCOUNTER — Ambulatory Visit: Payer: Medicare Other | Attending: Internal Medicine

## 2020-04-17 DIAGNOSIS — Z23 Encounter for immunization: Secondary | ICD-10-CM

## 2020-04-17 NOTE — Progress Notes (Signed)
   Covid-19 Vaccination Clinic  Name:  Jaclyn [name]    MRN: [id number] DOB: 03-20-1956  04/17/2020  Jaclyn [name] was observed post Covid-19 immunization for 15 minutes without incident. She was provided with Vaccine Information Sheet and instruction to access the V-Safe system.   Jaclyn [name] was instructed to call 911 with any severe reactions post vaccine: Marland Kitchen Difficulty breathing  . Swelling of face and throat  . A fast heartbeat  . A bad rash all over body  . Dizziness and weakness   Immunizations Administered    Name Date Dose VIS Date Route   Pfizer COVID-19 Vaccine 04/17/2020  2:48 PM 0.3 mL 02/21/2019 Intramuscular   Manufacturer: [employer], [employer]   Lot: [id number]   NDC: [address]-0

## 2020-06-11 IMAGING — CT CT ABD-PELV W/ CM
2 of 5 series · 17 of 46 positions shown, 19 images · IV contrast (ISOVUE)
Comparison: 09/17/2018

CLINICAL DATA: Periumbilical abdominal pain, nausea, vomiting,
history hypertension, diabetes mellitus

EXAM:
CT ABDOMEN AND PELVIS WITH CONTRAST
TECHNIQUE: Multidetector CT imaging of the abdomen and pelvis was performed
using the standard protocol following bolus administration of
intravenous contrast. Sagittal and coronal MPR images reconstructed
from axial data set.
CONTRAST:  100mL [id number] IOPAMIDOL ([id number]) INJECTION 61%

[Series 2: axial st · axial · 0.84mm/px · z∈[+1165,+1555]mm · 14 of 91 slices shown, 16 images]
[im 7/91  soft-tissue]
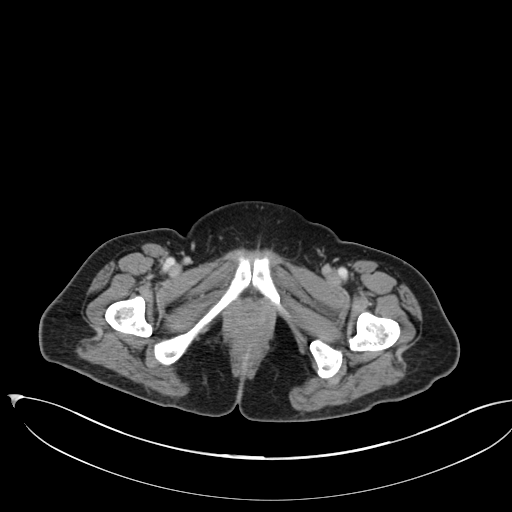
[im 7/91  bone]
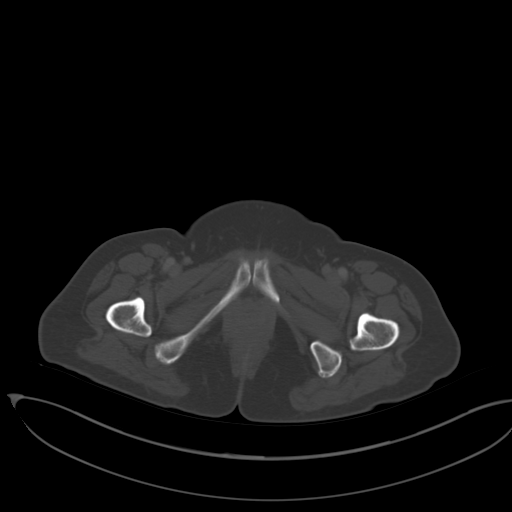
[im 13/91  soft-tissue]
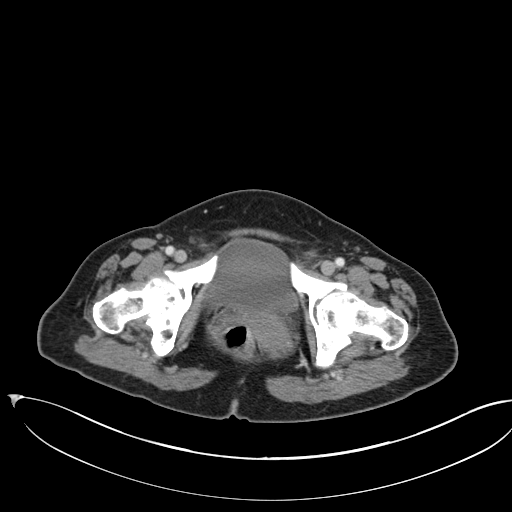
[im 19/91  soft-tissue]
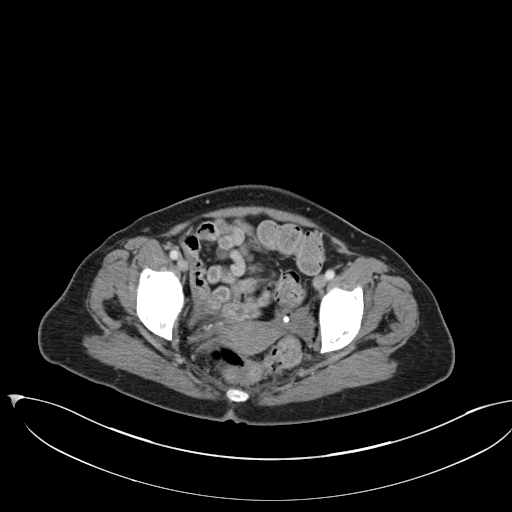
[im 25/91  soft-tissue]
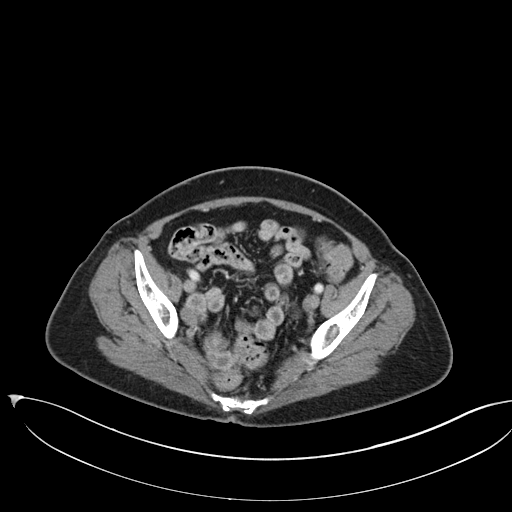
[im 31/91  soft-tissue]
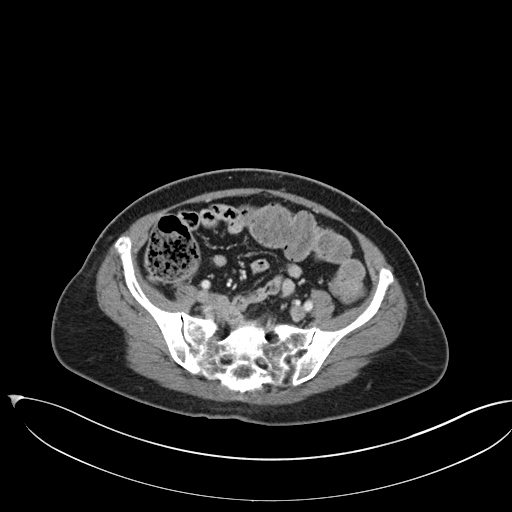
[im 37/91  soft-tissue]
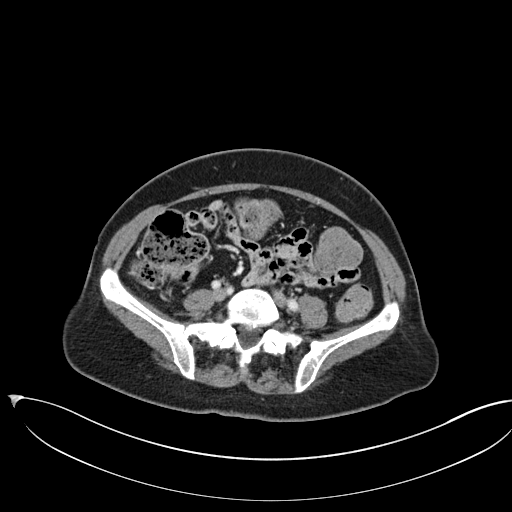
[im 43/91  soft-tissue]
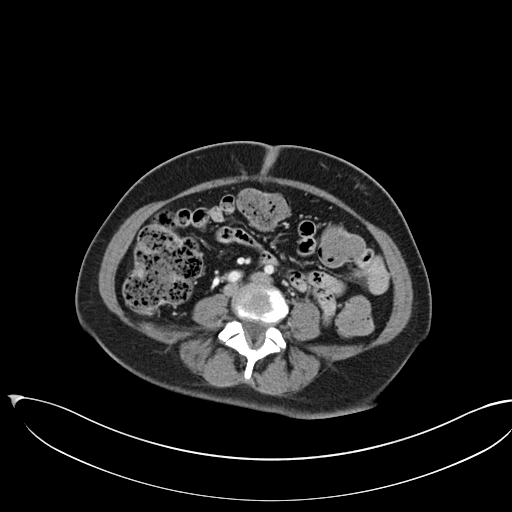
[im 49/91  soft-tissue]
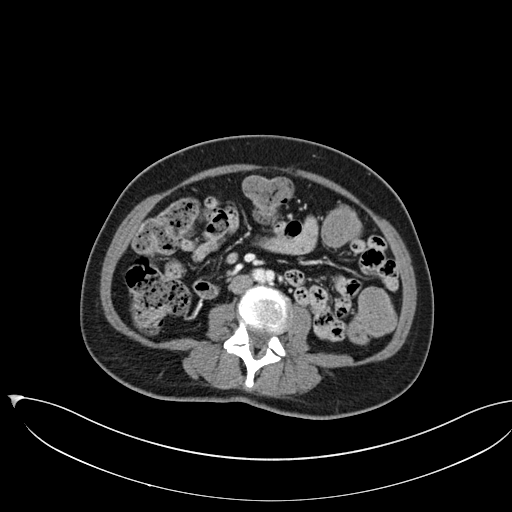
[im 55/91  soft-tissue]
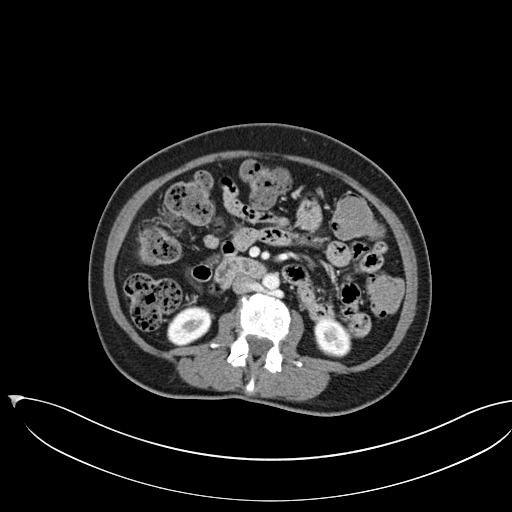
[im 55/91  bone]
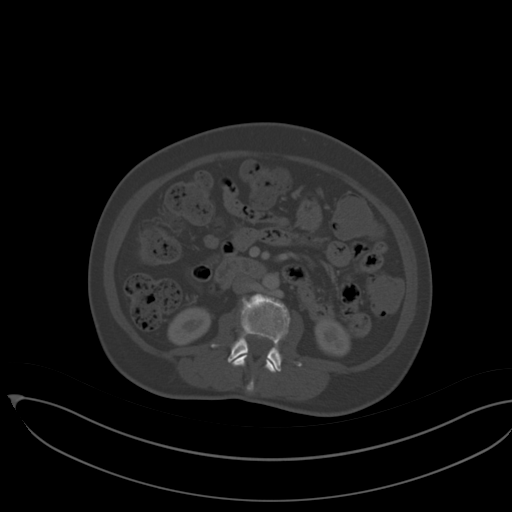
[im 61/91  soft-tissue]
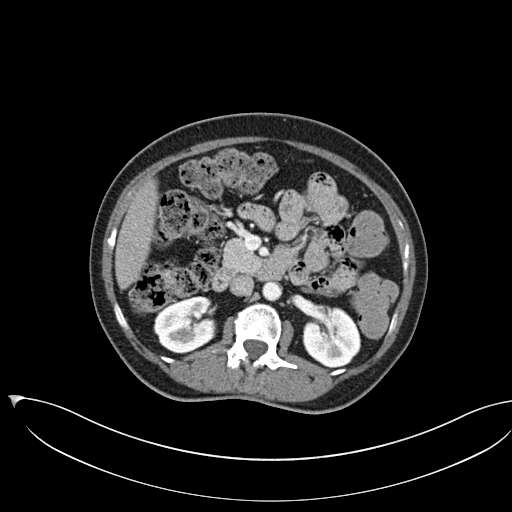
[im 67/91  soft-tissue]
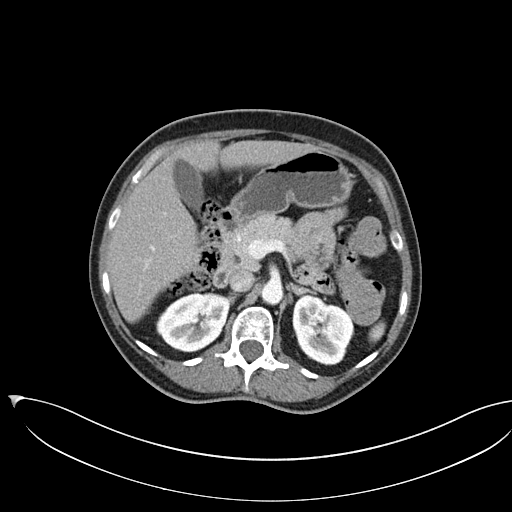
[im 73/91  soft-tissue]
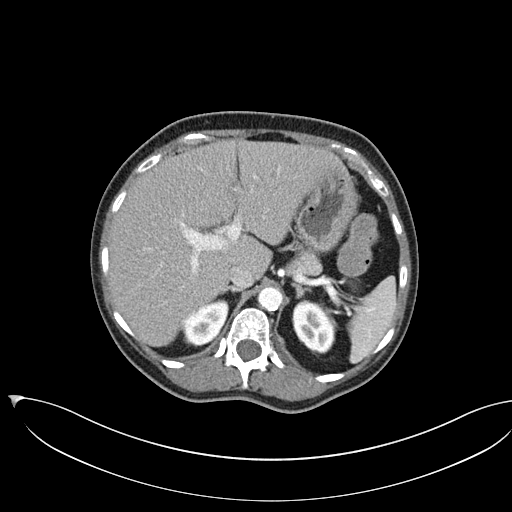
[im 79/91  soft-tissue]
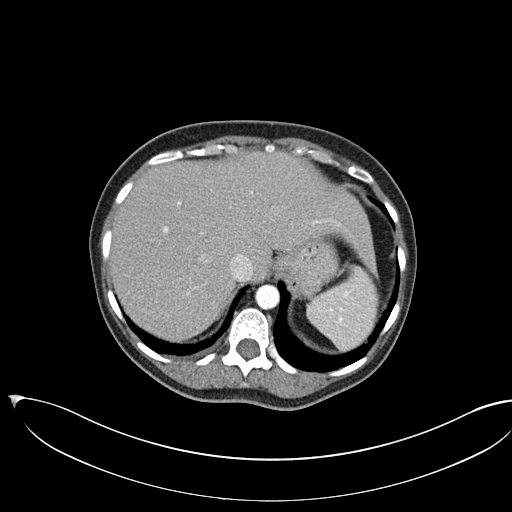
[im 85/91  soft-tissue]
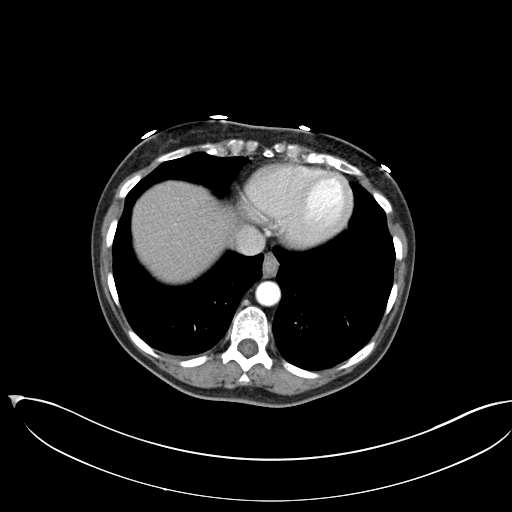

[Series 4: coronal st · coronal · 0.86mm/px · 3 of 100 slices shown]
[im 34/100  soft-tissue]
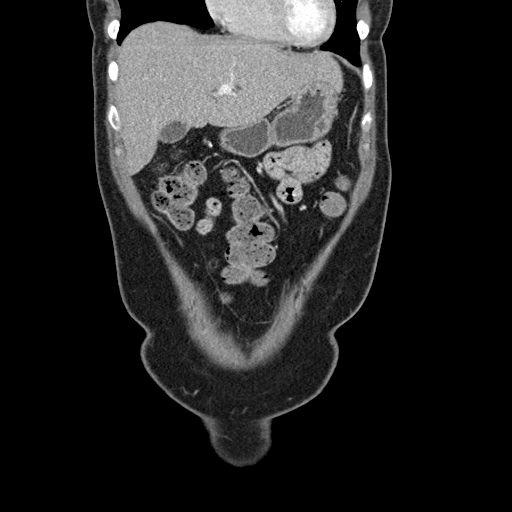
[im 45/100  soft-tissue]
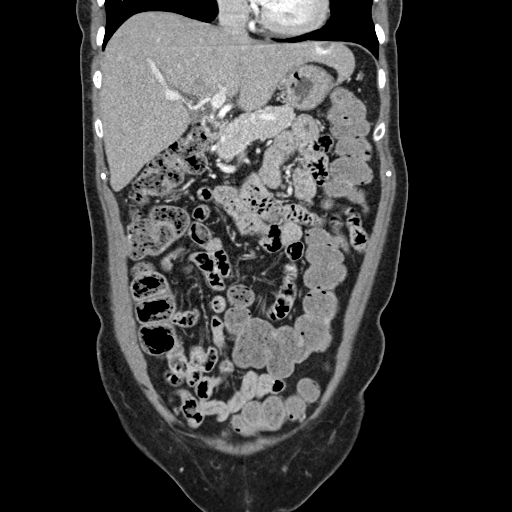
[im 56/100  soft-tissue]
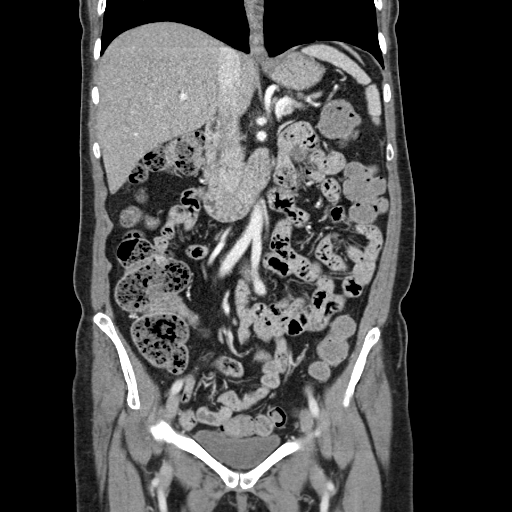

[17 of 46 positions shown; findings below may reference images not displayed]

FINDINGS: Lower chest: Lung bases clear

Hepatobiliary: Mass identified lateral segment LEFT lobe liver 18 x
14 x 11 mm diameter with dense nodular peripheral enhancement
question hemangioma, unchanged. No additional focal hepatic
abnormalities. Gallbladder unremarkable.

Pancreas: Normal appearance

Spleen: Normal appearance

Adrenals/Urinary Tract: Adrenal glands, kidneys, ureters, and
bladder normal appearance

Stomach/Bowel: Normal appendix. Stomach and bowel loops normal
appearance.

Vascular/Lymphatic: Pelvic phleboliths. Aorta normal caliber with
scattered atherosclerotic calcifications. Retroaortic LEFT renal
vein. No adenopathy.

Reproductive: Unremarkable uterus and ovaries

Other: No free air or free fluid. No hernia or acute inflammatory
process.

Musculoskeletal: Mild degenerative disc disease changes and minimal
levoconvex scoliosis lumbar spine. Degenerative changes of the hip
joints bilaterally.
IMPRESSION: Probable small hemangioma lateral segment LEFT lobe liver 18 mm
greatest size, unchanged.

No acute intra-abdominal or intrapelvic abnormalities.

## 2020-07-05 ENCOUNTER — Other Ambulatory Visit: Payer: Self-pay | Admitting: Cardiology

## 2020-07-10 MED ORDER — ENALAPRIL MALEATE 20 MG PO TABS: 20.0000 mg | ORAL_TABLET | Freq: Two times a day (BID) | ORAL | 5 refills | Status: DC

## 2020-07-10 NOTE — Telephone Encounter (Signed)
Rollene Rotunda, MD  You 15 hours ago (4:07 PM)   Please increase the Vasotec to 20 mg PO bid.  Please call in a new prescription.

## 2020-07-15 DIAGNOSIS — H9193 Unspecified hearing loss, bilateral: Secondary | ICD-10-CM | POA: Insufficient documentation

## 2020-07-15 DIAGNOSIS — H938X3 Other specified disorders of ear, bilateral: Secondary | ICD-10-CM

## 2020-07-15 DIAGNOSIS — M26629 Arthralgia of temporomandibular joint, unspecified side: Secondary | ICD-10-CM | POA: Insufficient documentation

## 2020-07-15 HISTORY — DX: Other specified disorders of ear, bilateral: H93.8X3

## 2020-08-03 ENCOUNTER — Other Ambulatory Visit: Payer: Self-pay | Admitting: Cardiology

## 2020-08-09 MED ORDER — ENALAPRIL MALEATE 20 MG PO TABS: 20.0000 mg | ORAL_TABLET | Freq: Two times a day (BID) | ORAL | 0 refills | Status: DC

## 2020-08-09 NOTE — Addendum Note (Signed)
Addended by: Freddi Starr on: 08/09/2020 08:20 AM   Modules accepted: Orders

## 2020-09-04 NOTE — Progress Notes (Signed)
Cardiology Office Note   Date:  09/05/2020   ID:  Season, Astacio 10-07-1956, MRN [id number]  PCP:  Clayborn Heron, MD  Cardiologist:   Rollene Rotunda, MD    Chief Complaint  Patient presents with  . Hypertension      History of Present Illness: Jaclyn [name] is a 64 y.o. female who presents for who was referred by Clayborn Heron, MD for evaluation of difficult to control HTN.  I added Norvasc and PRN hydralazine. After the last visit I increased her enalapril to 20 mg bid.    She is not having to use the hydralazine.  She is only taking her blood pressure about once a week or so.  She is getting systolics in the 150s to 160s but she has not seen any above 180 at which point she would take the hydralazine.  She is having her anxiety managed.  She is walking a little bit more.  She denies any acute cardiovascular symptoms. The patient denies any new symptoms such as chest discomfort, neck or arm discomfort. There has been no new shortness of breath, PND or orthopnea. There have been no reported palpitations, presyncope or syncope.    Past Medical History:  Diagnosis Date  . Anxiety   . Arthritis   . Depression   . Diabetes mellitus without complication (HCC)   . Hypertension     Past Surgical History:  Procedure Laterality Date  . NASAL SEPTUM SURGERY    . TUBAL LIGATION       Current Outpatient Medications  Medication Sig Dispense Refill  . busPIRone (BUSPAR) 10 MG tablet Take 10 mg by mouth 2 (two) times daily.  1  . cetirizine (ZYRTEC) 10 MG chewable tablet Chew 10 mg by mouth daily.    . chlorhexidine (PERIDEX) 0.12 % solution Use as directed 15 mLs in the mouth or throat 2 (two) times daily as needed (mouth soreness).    Marland Kitchen glipiZIDE (GLUCOTROL) 10 MG tablet Take 1 tablet (10 mg total) by mouth daily. 90 tablet 3  . glucose blood test strip Use to check blood sugar twice daily (Patient taking differently: 1 each by Other route as needed (blood sugar). One  touch verio--Use to check blood sugar twice daily) 100 each 5  . hydrALAZINE (APRESOLINE) 10 MG tablet Take 1 tablet (10 mg total) by mouth every 8 (eight) hours as needed. For blood pressure over 180 30 tablet 6  . ibuprofen (ADVIL,MOTRIN) 600 MG tablet Take 1 tablet (600 mg total) by mouth every 6 (six) hours as needed. 30 tablet 0  . Insulin Detemir (LEVEMIR FLEXTOUCH) 100 UNIT/ML Pen Inject 28 Units into the skin daily at 10 pm. (Patient taking differently: Inject 20-30 Units into the skin 2 (two) times daily. 20units in AM and 30 units hs) 9 mL 0  . Insulin Pen Needle (BD PEN NEEDLE NANO U/F) 32G X 4 MM MISC Test daily. 100 each 5  . metFORMIN (GLUCOPHAGE) 1000 MG tablet TAKE ONE TABLET BY MOUTH TWICE DAILY WITH  A  MEAL (Patient taking differently: Take 1,000 mg by mouth 2 (two) times daily with a meal. ) 180 tablet 3  . Multiple Vitamin (MULTIVITAMIN WITH MINERALS) TABS tablet Take 1 tablet by mouth daily.    . Olmesartan-amLODIPine-HCTZ (TRIBENZOR) 40-10-25 MG TABS One tablet by mouth daily 30 tablet 3  . omeprazole (PRILOSEC) 40 MG capsule Take 40 mg by mouth daily.    . risperiDONE (RISPERDAL) 0.5 MG  tablet TAKE ONE TABLET BY MOUTH TWICE DAILY FOR MOOD  CONTROL. (Patient taking differently: Take 0.5-1 mg by mouth See admin instructions. 0.5mg  in AM and 1mg  in evening) 180 tablet 0  . sertraline (ZOLOFT) 100 MG tablet Take 200 mg by mouth daily.      No current facility-administered medications for this visit.    Allergies:   Codeine, Flagyl [metronidazole], Metronidazole benzo+syrspend, Morphine and related, and Remeron [mirtazapine]   ROS:  Please see the history of present illness.   Otherwise, review of systems are positive for she is being actively managed for anxiety.  All other systems are reviewed and negative.    PHYSICAL EXAM: VS:  BP (!) 168/74   Pulse 78   Ht 5\' 3"  (1.6 m)   Wt 130 lb (59 kg)   SpO2 96%   BMI 23.03 kg/m  , BMI Body mass index is 23.03 kg/m. GENERAL:   Well appearing NECK:  No jugular venous distention, waveform within normal limits, carotid upstroke brisk and symmetric, no bruits, no thyromegaly LUNGS:  Clear to auscultation bilaterally CHEST:  Unremarkable HEART:  PMI not displaced or sustained,S1 and S2 within normal limits, no S3, no S4, no clicks, no rubs, no murmurs ABD:  Flat, positive bowel sounds normal in frequency in pitch, no bruits, no rebound, no guarding, no midline pulsatile mass, no hepatomegaly, no splenomegaly EXT:  2 plus pulses throughout, no edema, no cyanosis no clubbing   EKG:  EKG is not ordered today.   Recent Labs: 02/26/2020: ALT 24; BUN 11; Creatinine, Ser 0.51; Hemoglobin 11.5; Platelets 262; Potassium 3.8; Sodium 137    Lipid Panel    Component Value Date/Time   CHOL 166 07/06/2014 1632   TRIG 272 (H) 07/06/2014 1632   HDL 38 (L) 07/06/2014 1632   CHOLHDL 4.4 07/06/2014 1632   VLDL 54 (H) 07/06/2014 1632   LDLCALC 74 07/06/2014 1632      Wt Readings from Last 3 Encounters:  09/05/20 130 lb (59 kg)  09/21/19 128 lb (58.1 kg)  06/08/19 121 lb (54.9 kg)      Other studies Reviewed: Additional studies/ records that were reviewed today include: Labs.  . Review of the above records demonstrates:  Please see elsewhere in the note.     ASSESSMENT AND PLAN:  HTN:   Her blood pressure is better controlled but not quite at target.  I am going to discontinue her amlodipine and her Vasotec and switch her to Tribenzor 10/40/25.  I will check a basic metabolic in 2 weeks.  She will come back to our Hypertension Clinic for further management and also will send me a blood pressure diary in the next couple of months.   DM: Her A1c was down to 7.6 from 8.2.    ANXIETY:    This is being actively managed.  COVID EDUCATION: She has had her vaccine.   Current medicines are reviewed at length with the patient today.  The patient does not have concerns regarding medicines.  The following changes have been  made:   As above  Labs/ tests ordered today include:   Orders Placed This Encounter  Procedures  . Basic metabolic panel     Disposition:   FU with HTN Clinic in two months.    Signed, 08/08/19, MD  09/05/2020 9:32 AM    [facility]

## 2020-09-05 ENCOUNTER — Other Ambulatory Visit: Payer: Self-pay

## 2020-09-05 ENCOUNTER — Ambulatory Visit: Payer: Medicare Other | Admitting: Cardiology

## 2020-09-05 ENCOUNTER — Encounter: Payer: Self-pay | Admitting: Cardiology

## 2020-09-05 VITALS — BP 168/74 | HR 78 | Ht 63.0 in | Wt 130.0 lb

## 2020-09-05 DIAGNOSIS — I10 Essential (primary) hypertension: Secondary | ICD-10-CM | POA: Diagnosis not present

## 2020-09-05 DIAGNOSIS — E118 Type 2 diabetes mellitus with unspecified complications: Secondary | ICD-10-CM | POA: Diagnosis not present

## 2020-09-05 MED ORDER — OLMESARTAN-AMLODIPINE-HCTZ 40-10-25 MG PO TABS: ORAL_TABLET | ORAL | 3 refills | Status: DC

## 2020-09-05 NOTE — Patient Instructions (Signed)
Medication Instructions:  PLEASE STOP NORVASC PLEASE STOP ENALAPRIL PLEASE START TRIBENZOR 10/40/25 one tablet daily by mouth   *If you need a refill on your cardiac medications before your next appointment, please call your pharmacy*  Lab Work: BMET in 2 Weeks- can return to office or go to any [facility].  If you have labs (blood work) drawn today and your tests are completely normal, you will receive your results only by: Marland Kitchen MyChart Message (if you have MyChart) OR . A paper copy in the mail If you have any lab test that is abnormal or we need to change your treatment, we will call you to review the results.  Testing/Procedures: None Ordered At This Time.   Follow-Up: At [facility], you and your health needs are our priority.  As part of our continuing mission to provide you with exceptional heart care, we have created designated Provider Care Teams.  These Care Teams include your primary Cardiologist (physician) and Advanced Practice Providers (APPs -  Physician Assistants and Nurse Practitioners) who all work together to provide you with the care you need, when you need it.  Your next appointment:   2 month(s)  The format for your next appointment:   In Person  Provider:   Hypertension Clinic PharmD   Other Instructions Please follow up with our hypertension clinic ([facility]) in 2 months

## 2020-09-06 ENCOUNTER — Telehealth: Payer: Self-pay | Admitting: Cardiology

## 2020-09-06 LAB — BASIC METABOLIC PANEL
BUN/Creatinine Ratio: 20 (ref 12–28)
BUN: 12 mg/dL (ref 8–27)
CO2: 24 mmol/L (ref 20–29)
Calcium: 9.6 mg/dL (ref 8.7–10.3)
Chloride: 101 mmol/L (ref 96–106)
Creatinine, Ser: 0.6 mg/dL (ref 0.57–1.00)
GFR calc Af Amer: 113 mL/min/{1.73_m2} (ref >59)
GFR calc non Af Amer: 98 mL/min/{1.73_m2} (ref >59)
Glucose: 104 mg/dL — ABNORMAL HIGH (ref 65–99)
Potassium: 4.9 mmol/L (ref 3.5–5.2)
Sodium: 141 mmol/L (ref 134–144)

## 2020-09-06 NOTE — Telephone Encounter (Signed)
Returned call to patient. Advised patient of BMET results being okay per Dr. Antoine Poche.   See Result Note in chart.

## 2020-09-06 NOTE — Telephone Encounter (Signed)
° ° °  Pt is returning call from [place] to get lab result

## 2020-11-05 ENCOUNTER — Ambulatory Visit: Payer: Medicare Other

## 2020-11-05 NOTE — Progress Notes (Deleted)
11/05/2020 Jaclyn [name] 13-Apr-1956 [id number]   HPI:  Jaclyn [name] is a 64 y.o. female patient of Dr Antoine Poche, with a PMH below who presents today for hypertension clinic evaluation.  In addition to hypertension, her medical history is significant for DM2 (A1c 7.6 6/21) and bipolar depression.  She was seen by Dr. Antoine Poche in September and found to have a pressure of 168/74.  At that time he discontinued amlodipine and enalapril, and put her on Tribenzor 40/10/25 once daily.  Labs drawn on that day showed SCr to be at 0.6 and potassium at 4.9 7.6 (June 21)     Blood Pressure Goal:  130/80  Current Medications: olmesartan-amlodpine-hctz 40/10/25; hydralazine 10 mg prn systolic > 180  Family Hx:  Social Hx:  Diet:  Exercise:  Home BP readings:  Intolerances:   Labs:  Wt Readings from Last 3 Encounters:  09/05/20 130 lb (59 kg)  09/21/19 128 lb (58.1 kg)  06/08/19 121 lb (54.9 kg)   BP Readings from Last 3 Encounters:  09/05/20 (!) 168/74  02/26/20 (!) 163/91  06/08/19 140/78   Pulse Readings from Last 3 Encounters:  09/05/20 78  02/26/20 92  06/08/19 84    Current Outpatient Medications  Medication Sig Dispense Refill  . busPIRone (BUSPAR) 10 MG tablet Take 10 mg by mouth 2 (two) times daily.  1  . cetirizine (ZYRTEC) 10 MG chewable tablet Chew 10 mg by mouth daily.    . chlorhexidine (PERIDEX) 0.12 % solution Use as directed 15 mLs in the mouth or throat 2 (two) times daily as needed (mouth soreness).    Marland Kitchen glipiZIDE (GLUCOTROL) 10 MG tablet Take 1 tablet (10 mg total) by mouth daily. 90 tablet 3  . glucose blood test strip Use to check blood sugar twice daily (Patient taking differently: 1 each by Other route as needed (blood sugar). One touch verio--Use to check blood sugar twice daily) 100 each 5  . hydrALAZINE (APRESOLINE) 10 MG tablet Take 1 tablet (10 mg total) by mouth every 8 (eight) hours as needed. For blood pressure over 180 30 tablet 6  . ibuprofen  (ADVIL,MOTRIN) 600 MG tablet Take 1 tablet (600 mg total) by mouth every 6 (six) hours as needed. 30 tablet 0  . Insulin Detemir (LEVEMIR FLEXTOUCH) 100 UNIT/ML Pen Inject 28 Units into the skin daily at 10 pm. (Patient taking differently: Inject 20-30 Units into the skin 2 (two) times daily. 20units in AM and 30 units hs) 9 mL 0  . Insulin Pen Needle (BD PEN NEEDLE NANO U/F) 32G X 4 MM MISC Test daily. 100 each 5  . metFORMIN (GLUCOPHAGE) 1000 MG tablet TAKE ONE TABLET BY MOUTH TWICE DAILY WITH  A  MEAL (Patient taking differently: Take 1,000 mg by mouth 2 (two) times daily with a meal. ) 180 tablet 3  . Multiple Vitamin (MULTIVITAMIN WITH MINERALS) TABS tablet Take 1 tablet by mouth daily.    . Olmesartan-amLODIPine-HCTZ (TRIBENZOR) 40-10-25 MG TABS One tablet by mouth daily 30 tablet 3  . omeprazole (PRILOSEC) 40 MG capsule Take 40 mg by mouth daily.    . risperiDONE (RISPERDAL) 0.5 MG tablet TAKE ONE TABLET BY MOUTH TWICE DAILY FOR MOOD  CONTROL. (Patient taking differently: Take 0.5-1 mg by mouth See admin instructions. 0.5mg  in AM and 1mg  in evening) 180 tablet 0  . sertraline (ZOLOFT) 100 MG tablet Take 200 mg by mouth daily.      No current facility-administered medications for this  visit.    Allergies  Allergen Reactions  . Codeine Nausea And Vomiting    States that she cannot take in high doses  . Flagyl [Metronidazole] Other (See Comments)    Fast heartbeat,tingly  . Metronidazole Benzo+Syrspend   . Morphine And Related Nausea And Vomiting    States that she cannot take in high doses  . Remeron [Mirtazapine] Other (See Comments)    Severe restlessness,irritable,hyperactivity,agitation    Past Medical History:  Diagnosis Date  . Anxiety   . Arthritis   . Depression   . Diabetes mellitus without complication (HCC)   . Hypertension     There were no vitals taken for this visit.  No problem-specific Assessment & Plan notes found for this encounter.   Phillips Hay  PharmD CPP [facility] [address] [place], [place] [address] [phone]

## 2020-11-11 ENCOUNTER — Ambulatory Visit (HOSPITAL_COMMUNITY)
Admission: RE | Admit: 2020-11-11 | Discharge: 2020-11-11 | Disposition: A | Discharge status: Home / Self Care | Payer: Medicare Other | Source: Ambulatory Visit | Attending: Family Medicine | Admitting: Family Medicine

## 2020-11-11 ENCOUNTER — Encounter (HOSPITAL_COMMUNITY): Payer: Self-pay

## 2020-11-11 ENCOUNTER — Other Ambulatory Visit: Payer: Self-pay

## 2020-11-11 ENCOUNTER — Ambulatory Visit (INDEPENDENT_AMBULATORY_CARE_PROVIDER_SITE_OTHER): Payer: Medicare Other

## 2020-11-11 VITALS — BP 170/84 | HR 77 | Temp 97.1°F | Resp 17

## 2020-11-11 DIAGNOSIS — R0781 Pleurodynia: Secondary | ICD-10-CM | POA: Diagnosis not present

## 2020-11-11 MED ORDER — CYCLOBENZAPRINE HCL 5 MG PO TABS: 5.0000 mg | ORAL_TABLET | Freq: Three times a day (TID) | ORAL | 0 refills | Status: DC | PRN

## 2020-11-11 NOTE — ED Triage Notes (Signed)
Pt presents with left side rib pain after a fall in her yard on Saturday.

## 2020-11-11 NOTE — ED Provider Notes (Signed)
[facility]    CSN: [id number] Arrival date & time: 11/11/20  1720      History   Chief Complaint Chief Complaint  Patient presents with  . Fall  . Rib Pain    HPI Jaclyn [name] is a 64 y.o. female.   Presenting today with left anterior rib pain and right thumb pain after a fall outside on the pavement that occurred 2 days ago. She states she mostly caught herself with the right hand bit did make impact with the ground on her left side. Some mild abrasions to knees and left arm but those have been healing well. Breathing is very painful per patient. Denies LOC, dizziness, other joint pains. Has not been trying anything OTC for sxs at this time.      Past Medical History:  Diagnosis Date  . Anxiety   . Arthritis   . Depression   . Diabetes mellitus without complication (HCC)   . Hypertension     Patient Active Problem List   Diagnosis Date Noted  . Educated about COVID-19 virus infection 04/24/2019  . Bipolar depression (HCC) 07/07/2013  . Delirium, drug-induced 11/10/2012  . Drug overdose, multiple drugs 11/09/2012  . Suicidal behavior 11/09/2012  . DM type 2 (diabetes mellitus, type 2) (HCC) 11/09/2012  . Abnormal EKG 11/09/2012  . 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 Hypertension 11/09/2012  . Depression     Past Surgical History:  Procedure Laterality Date  . NASAL SEPTUM SURGERY    . TUBAL LIGATION      OB History   No obstetric history on file.      Home Medications    Prior to Admission medications   Medication Sig Start Date End Date Taking? Authorizing Provider  busPIRone (BUSPAR) 10 MG tablet Take 10 mg by mouth 2 (two) times daily. 07/16/18   [provider]  cetirizine (ZYRTEC) 10 MG chewable tablet Chew 10 mg by mouth daily.    [provider]  chlorhexidine (PERIDEX) 0.12 % solution Use as directed 15 mLs in the mouth or throat 2 (two) times daily as needed (mouth soreness).    [provider]    cyclobenzaprine (FLEXERIL) 5 MG tablet Take 1 tablet (5 mg total) by mouth 3 (three) times daily as needed for muscle spasms. DO NOT DRINK ALCOHOL OR DRIVE WHILE TAKING THIS MEDICATION 11/11/20   Particia Nearing, PA-C  glipiZIDE (GLUCOTROL) 10 MG tablet Take 1 tablet (10 mg total) by mouth daily. 04/06/15   Kriste Basque R, DO  glucose blood test strip Use to check blood sugar twice daily Patient taking differently: 1 each by Other route as needed (blood sugar). One touch verio--Use to check blood sugar twice daily 02/27/15   Worthy Rancher B, FNP  hydrALAZINE (APRESOLINE) 10 MG tablet Take 1 tablet (10 mg total) by mouth every 8 (eight) hours as needed. For blood pressure over 180 04/24/19   Rollene Rotunda, MD  ibuprofen (ADVIL,MOTRIN) 600 MG tablet Take 1 tablet (600 mg total) by mouth every 6 (six) hours as needed. 09/17/18   Law, Waylan Boga, PA-C  Insulin Detemir (LEVEMIR FLEXTOUCH) 100 UNIT/ML Pen Inject 28 Units into the skin daily at 10 pm. Patient taking differently: Inject 20-30 Units into the skin 2 (two) times daily. 20units in AM and 30 units hs 07/04/15   Kriste Basque R, DO  Insulin Pen Needle (BD PEN NEEDLE NANO U/F) 32G X 4 MM MISC Test daily. 09/11/14   Pricilla Holm,  Wilmon Arms, PA-C  metFORMIN (GLUCOPHAGE) 1000 MG tablet TAKE ONE TABLET BY MOUTH TWICE DAILY WITH  A  MEAL Patient taking differently: Take 1,000 mg by mouth 2 (two) times daily with a meal.  04/06/15   Terressa Koyanagi, DO  Multiple Vitamin (MULTIVITAMIN WITH MINERALS) TABS tablet Take 1 tablet by mouth daily.    [provider]  Olmesartan-amLODIPine-HCTZ Marya Landry) 40-10-25 MG TABS One tablet by mouth daily 09/05/20   Rollene Rotunda, MD  omeprazole (PRILOSEC) 40 MG capsule Take 40 mg by mouth daily. 01/01/20   [provider]  risperiDONE (RISPERDAL) 0.5 MG tablet TAKE ONE TABLET BY MOUTH TWICE DAILY FOR MOOD  CONTROL. Patient taking differently: Take 0.5-1 mg by mouth See admin instructions. 0.5mg  in AM and 1mg  in  evening 05/24/15   05/26/15 B, FNP  sertraline (ZOLOFT) 100 MG tablet Take 200 mg by mouth daily.  05/07/17   [provider]    Family History Family History  Problem Relation Age of Onset  . Huntington's disease Father     Social History Social History   Tobacco Use  . Smoking status: Never Smoker  . Smokeless tobacco: Never Used  Vaping Use  . Vaping Use: Never used  Substance Use Topics  . Alcohol use: No  . Drug use: No     Allergies   Codeine, Flagyl [metronidazole], Metronidazole benzo+syrspend, Morphine and related, and Remeron [mirtazapine]   Review of Systems Review of Systems PER HPI    Physical Exam Triage Vital Signs ED Triage Vitals  Enc Vitals Group     BP 11/11/20 1759 (!) 170/84     Pulse Rate 11/11/20 1759 77     Resp 11/11/20 1759 17     Temp 11/11/20 1759 (!) 97.1 F (36.2 C)     Temp Source 11/11/20 1759 Oral     SpO2 11/11/20 1759 99 %     Weight --      Height --      Head Circumference --      Peak Flow --      Pain Score 11/11/20 1758 7     Pain Loc --      Pain Edu? --      Excl. in GC? --    No data found.  Updated Vital Signs BP (!) 170/84 (BP Location: Right Arm)   Pulse 77   Temp (!) 97.1 F (36.2 C) (Oral)   Resp 17   SpO2 99%   Visual Acuity Right Eye Distance:   Left Eye Distance:   Bilateral Distance:    Right Eye Near:   Left Eye Near:    Bilateral Near:     Physical Exam Vitals and nursing note reviewed.  Constitutional:      Appearance: Normal appearance. She is not ill-appearing.  HENT:     Head: Atraumatic.  Eyes:     Extraocular Movements: Extraocular movements intact.     Conjunctiva/sclera: Conjunctivae normal.  Cardiovascular:     Rate and Rhythm: Normal rate and regular rhythm.     Heart sounds: Normal heart sounds.  Pulmonary:     Effort: Pulmonary effort is normal.     Breath sounds: Normal breath sounds.     Comments: Good breath sounds b/l, chest rise symmetric  b/l Abdominal:     General: Bowel sounds are normal. There is no distension.     Palpations: Abdomen is soft.     Tenderness: There is no abdominal tenderness. There is  no guarding.  Musculoskeletal:        General: Tenderness (point tender left anterior ribs directly under breast, no bruising or swelling in area, no deformity palpable) present. Normal range of motion.     Cervical back: Normal range of motion and neck supple.  Skin:    General: Skin is warm and dry.  Neurological:     Mental Status: She is alert. Mental status is at baseline.  Psychiatric:        Mood and Affect: Mood normal.        Thought Content: Thought content normal.        Judgment: Judgment normal.     UC Treatments / Results  Labs (all labs ordered are listed, but only abnormal results are displayed) Labs Reviewed - No data to display  EKG   Radiology DG Ribs Unilateral W/Chest Left  Result Date: 11/11/2020 CLINICAL DATA:  Left anterior rib pain, fall EXAM: LEFT RIBS AND CHEST - 3+ VIEW COMPARISON:  09/27/2013 FINDINGS: No fracture or other bone lesions are seen involving the ribs. There is no evidence of pneumothorax or pleural effusion. Both lungs are clear. Heart size and mediastinal contours are within normal limits. IMPRESSION: Negative. Electronically Signed   By: Charlett Nose M.D.   On: 11/11/2020 19:12    Procedures Procedures (including critical care time)  Medications Ordered in UC Medications - No data to display  Initial Impression / Assessment and Plan / UC Course  I have reviewed the triage vital signs and the nursing notes.  Pertinent labs & imaging results that were available during my care of the patient were reviewed by me and considered in my medical decision making (see chart for details).     X-ray ribs left side without bony injury. Rib contusion reviewed with patient, discussed lidocaine patches, OTC pain relievers, low dose flexeril prn with precautions reviewed.  Supportive home care discussed, return precautions given.   Final Clinical Impressions(s) / UC Diagnoses   Final diagnoses:  Rib pain on left side   Discharge Instructions   None    ED Prescriptions    Medication Sig Dispense Auth. Provider   cyclobenzaprine (FLEXERIL) 5 MG tablet Take 1 tablet (5 mg total) by mouth 3 (three) times daily as needed for muscle spasms. DO NOT DRINK ALCOHOL OR DRIVE WHILE TAKING THIS MEDICATION 30 tablet Particia Nearing, [place]     PDMP not reviewed this encounter.   Particia Nearing, [place] 11/11/20 1944

## 2020-11-19 ENCOUNTER — Other Ambulatory Visit: Payer: Self-pay | Admitting: Family Medicine

## 2020-11-20 ENCOUNTER — Encounter (HOSPITAL_COMMUNITY): Payer: Self-pay | Admitting: Emergency Medicine

## 2020-11-20 ENCOUNTER — Emergency Department (HOSPITAL_COMMUNITY): Payer: Medicare Other

## 2020-11-20 ENCOUNTER — Emergency Department (HOSPITAL_COMMUNITY)
Admission: EM | Admit: 2020-11-20 | Discharge: 2020-11-20 | Disposition: A | Discharge status: Home / Self Care | Payer: Medicare Other | Attending: Emergency Medicine | Admitting: Emergency Medicine

## 2020-11-20 DIAGNOSIS — W01198A Fall on same level from slipping, tripping and stumbling with subsequent striking against other object, initial encounter: Secondary | ICD-10-CM | POA: Diagnosis not present

## 2020-11-20 DIAGNOSIS — E119 Type 2 diabetes mellitus without complications: Secondary | ICD-10-CM | POA: Diagnosis not present

## 2020-11-20 DIAGNOSIS — Y9301 Activity, walking, marching and hiking: Secondary | ICD-10-CM | POA: Diagnosis not present

## 2020-11-20 DIAGNOSIS — Y92093 Driveway of other non-institutional residence as the place of occurrence of the external cause: Secondary | ICD-10-CM | POA: Diagnosis not present

## 2020-11-20 DIAGNOSIS — R0781 Pleurodynia: Secondary | ICD-10-CM | POA: Insufficient documentation

## 2020-11-20 DIAGNOSIS — I10 Essential (primary) hypertension: Secondary | ICD-10-CM | POA: Diagnosis not present

## 2020-11-20 DIAGNOSIS — Z794 Long term (current) use of insulin: Secondary | ICD-10-CM | POA: Insufficient documentation

## 2020-11-20 DIAGNOSIS — Z79899 Other long term (current) drug therapy: Secondary | ICD-10-CM | POA: Insufficient documentation

## 2020-11-20 DIAGNOSIS — Z7984 Long term (current) use of oral hypoglycemic drugs: Secondary | ICD-10-CM | POA: Diagnosis not present

## 2020-11-20 MED ORDER — TRAMADOL HCL 50 MG PO TABS
50.0000 mg | ORAL_TABLET | Freq: Four times a day (QID) | ORAL | 0 refills | Status: DC | PRN
Start: 2020-11-20 — End: 2021-02-24

## 2020-11-20 MED ORDER — TRAMADOL HCL 50 MG PO TABS
50.0000 mg | ORAL_TABLET | Freq: Once | ORAL | Status: AC
  Administered 2020-11-20: 50 mg via ORAL
  Filled 2020-11-20: qty 1

## 2020-11-20 NOTE — ED Triage Notes (Signed)
Patient here from home reporting fall on Sat. Left rib cage pain. Seen at urgent care with no relief. Reports that they could not do imaging.

## 2020-11-20 NOTE — ED Provider Notes (Signed)
[facility]-EMERGENCY DEPT Provider Note   CSN: [id number] Arrival date & time: 11/20/20  0300     History Chief Complaint  Patient presents with  . Rib Injury  . Fall    Jaclyn [name] is a 64 y.o. female.  The history is provided by the patient and medical records.    65 year old female with history of anxiety, arthritis, depression, diabetes, hypertension, presenting to the ED after a fall that occurred 3 days ago.  Daughter reports she was walking out the driveway when she lost her footing and fell into the car.  There was no head injury or loss of consciousness.  She was seen at urgent care that day but no x-rays were performed.  States she was prescribed muscle relaxer but is continue having pain.  Pain is worse with deep breathing, coughing, sneezing, twisting, or when trying to sit up from lying position.  There is no shortness of breath.  She has not had any cough or fever.  Past Medical History:  Diagnosis Date  . Anxiety   . Arthritis   . Depression   . Diabetes mellitus without complication (HCC)   . Hypertension     Patient Active Problem List   Diagnosis Date Noted  . Educated about COVID-19 virus infection 04/24/2019  . Bipolar depression (HCC) 07/07/2013  . Delirium, drug-induced 11/10/2012  . Drug overdose, multiple drugs 11/09/2012  . Suicidal behavior 11/09/2012  . DM type 2 (diabetes mellitus, type 2) (HCC) 11/09/2012  . Abnormal EKG 11/09/2012  . 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 Hypertension 11/09/2012  . Depression     Past Surgical History:  Procedure Laterality Date  . NASAL SEPTUM SURGERY    . TUBAL LIGATION       OB History   No obstetric history on file.     Family History  Problem Relation Age of Onset  . Huntington's disease Father     Social History   Tobacco Use  . Smoking status: Never Smoker  . Smokeless tobacco: Never Used  Vaping Use  . Vaping Use: Never used  Substance Use Topics  .  Alcohol use: No  . Drug use: No    Home Medications Prior to Admission medications   Medication Sig Start Date End Date Taking? Authorizing Provider  busPIRone (BUSPAR) 10 MG tablet Take 10 mg by mouth 2 (two) times daily. 07/16/18   [provider]  cetirizine (ZYRTEC) 10 MG chewable tablet Chew 10 mg by mouth daily.    [provider]  chlorhexidine (PERIDEX) 0.12 % solution Use as directed 15 mLs in the mouth or throat 2 (two) times daily as needed (mouth soreness).    [provider]  cyclobenzaprine (FLEXERIL) 5 MG tablet Take 1 tablet (5 mg total) by mouth 3 (three) times daily as needed for muscle spasms. DO NOT DRINK ALCOHOL OR DRIVE WHILE TAKING THIS MEDICATION 11/11/20   Particia Nearing, PA-C  glipiZIDE (GLUCOTROL) 10 MG tablet Take 1 tablet (10 mg total) by mouth daily. 04/06/15   Kriste Basque R, DO  glucose blood test strip Use to check blood sugar twice daily Patient taking differently: 1 each by Other route as needed (blood sugar). One touch verio--Use to check blood sugar twice daily 02/27/15   Worthy Rancher B, FNP  hydrALAZINE (APRESOLINE) 10 MG tablet Take 1 tablet (10 mg total) by mouth every 8 (eight) hours as needed. For blood pressure over 180 04/24/19   Rollene Rotunda,  MD  ibuprofen (ADVIL,MOTRIN) 600 MG tablet Take 1 tablet (600 mg total) by mouth every 6 (six) hours as needed. 09/17/18   Law, Waylan Boga, PA-C  Insulin Detemir (LEVEMIR FLEXTOUCH) 100 UNIT/ML Pen Inject 28 Units into the skin daily at 10 pm. Patient taking differently: Inject 20-30 Units into the skin 2 (two) times daily. 20units in AM and 30 units hs 07/04/15   Kriste Basque R, DO  Insulin Pen Needle (BD PEN NEEDLE NANO U/F) 32G X 4 MM MISC Test daily. 09/11/14   Toniann Ket, PA-C  metFORMIN (GLUCOPHAGE) 1000 MG tablet TAKE ONE TABLET BY MOUTH TWICE DAILY WITH  A  MEAL Patient taking differently: Take 1,000 mg by mouth 2 (two) times daily with a meal.  04/06/15   Terressa Koyanagi, DO   Multiple Vitamin (MULTIVITAMIN WITH MINERALS) TABS tablet Take 1 tablet by mouth daily.    [provider]  Olmesartan-amLODIPine-HCTZ Marya Landry) 40-10-25 MG TABS One tablet by mouth daily 09/05/20   Rollene Rotunda, MD  omeprazole (PRILOSEC) 40 MG capsule Take 40 mg by mouth daily. 01/01/20   [provider]  risperiDONE (RISPERDAL) 0.5 MG tablet TAKE ONE TABLET BY MOUTH TWICE DAILY FOR MOOD  CONTROL. Patient taking differently: Take 0.5-1 mg by mouth See admin instructions. 0.5mg  in AM and 1mg  in evening 05/24/15   05/26/15 B, FNP  sertraline (ZOLOFT) 100 MG tablet Take 200 mg by mouth daily.  05/07/17   [provider]    Allergies    Codeine, Flagyl [metronidazole], Metronidazole benzo+syrspend, Morphine and related, and Remeron [mirtazapine]  Review of Systems   Review of Systems  Cardiovascular: Positive for chest pain ( L Ribs).  All other systems reviewed and are negative.   Physical Exam Updated Vital Signs BP (!) 168/89 (BP Location: Right Arm)   Pulse 89   Temp 98 F (36.7 C) (Oral)   Resp 19   SpO2 100%   Physical Exam Vitals and nursing note reviewed.  Constitutional:      Appearance: She is well-developed.  HENT:     Head: Normocephalic and atraumatic.  Eyes:     Conjunctiva/sclera: Conjunctivae normal.     Pupils: Pupils are equal, round, and reactive to light.  Cardiovascular:     Rate and Rhythm: Normal rate and regular rhythm.     Heart sounds: Normal heart sounds.  Pulmonary:     Effort: Pulmonary effort is normal. No respiratory distress.     Breath sounds: Normal breath sounds. No rhonchi.     Comments: Lungs CTAB, no distress, speaking in full sentences without difficulty, O2 sats 100% on RA Chest:       Comments: Left rib tenderness as depicted, no bruising or gross deformity noted Abdominal:     General: Bowel sounds are normal.     Palpations: Abdomen is soft.  Musculoskeletal:        General: Normal range of  motion.     Cervical back: Normal range of motion.  Skin:    General: Skin is warm and dry.  Neurological:     Mental Status: She is alert and oriented to person, place, and time.     ED Results / Procedures / Treatments   Labs (all labs ordered are listed, but only abnormal results are displayed) Labs Reviewed - No data to display  EKG None  Radiology DG Ribs Unilateral W/Chest Left  Result Date: 11/20/2020 CLINICAL DATA:  Pain status post fall EXAM: LEFT RIBS AND CHEST -  3+ VIEW COMPARISON:  November 11, 2020 FINDINGS: No fracture or other bone lesions are seen involving the ribs. There is no evidence of pneumothorax or pleural effusion. Both lungs are clear. Heart size and mediastinal contours are within normal limits. IMPRESSION: Negative. Electronically Signed   By: Katherine Mantle M.D.   On: 11/20/2020 03:47    Procedures Procedures (including critical care time)  Medications Ordered in ED Medications  traMADol (ULTRAM) tablet 50 mg (50 mg Oral Given 11/20/20 0411)    ED Course  I have reviewed the triage vital signs and the nursing notes.  Pertinent labs & imaging results that were available during my care of the patient were reviewed by me and considered in my medical decision making (see chart for details).    MDM Rules/Calculators/A&P  64 y.o. F here with left rib pain after fall 3 days ago where she fell against a car in the driveway.  Seen at [facility] same day and prescribed muscle relaxer without relief.  She is afebrile, non-toxic, NAD.  Lungs CTAB, mild tenderness along left mid lateral ribs.  No gross deformity, bruising, or other signs of trauma.  Rib films negative.  Plan to d/c home with pain control, incentive spirometry, and close PCP follow-up.  Return here for any new/acute changes.  Final Clinical Impression(s) / ED Diagnoses Final diagnoses:  Rib pain on left side    Rx / DC Orders ED Discharge Orders         Ordered    traMADol (ULTRAM) 50 MG  tablet  Every 6 hours PRN        11/20/20 0431           Garlon Hatchet, PA-C 11/20/20 0434    Long, Arlyss Repress, MD 11/26/20 1042

## 2020-11-20 NOTE — Discharge Instructions (Addendum)
Take the prescribed medication as directed.  Can continue muscle relaxer and/or tylenol/motrin. Use incentive spirometer a few times a day to keep lungs strong. Follow-up with your primary care doctor. Return to the ED for new or worsening symptoms.

## 2020-12-05 MED ORDER — OLMESARTAN-AMLODIPINE-HCTZ 40-10-25 MG PO TABS: ORAL_TABLET | ORAL | 3 refills | Status: DC

## 2021-02-23 ENCOUNTER — Other Ambulatory Visit: Payer: Self-pay

## 2021-02-23 ENCOUNTER — Ambulatory Visit (HOSPITAL_COMMUNITY)
Admission: EM | Admit: 2021-02-23 | Discharge: 2021-02-24 | Disposition: A | Discharge status: Home / Self Care | Payer: Medicare Other | Attending: Family | Admitting: Family

## 2021-02-23 DIAGNOSIS — F419 Anxiety disorder, unspecified: Secondary | ICD-10-CM | POA: Insufficient documentation

## 2021-02-23 DIAGNOSIS — E118 Type 2 diabetes mellitus with unspecified complications: Secondary | ICD-10-CM | POA: Insufficient documentation

## 2021-02-23 DIAGNOSIS — Z794 Long term (current) use of insulin: Secondary | ICD-10-CM | POA: Diagnosis not present

## 2021-02-23 DIAGNOSIS — I10 Essential (primary) hypertension: Secondary | ICD-10-CM | POA: Diagnosis not present

## 2021-02-23 DIAGNOSIS — R450 Nervousness: Secondary | ICD-10-CM | POA: Insufficient documentation

## 2021-02-23 DIAGNOSIS — Z20822 Contact with and (suspected) exposure to covid-19: Secondary | ICD-10-CM | POA: Diagnosis not present

## 2021-02-23 DIAGNOSIS — R45851 Suicidal ideations: Secondary | ICD-10-CM | POA: Diagnosis present

## 2021-02-23 DIAGNOSIS — E119 Type 2 diabetes mellitus without complications: Secondary | ICD-10-CM | POA: Diagnosis not present

## 2021-02-23 DIAGNOSIS — Z7984 Long term (current) use of oral hypoglycemic drugs: Secondary | ICD-10-CM | POA: Insufficient documentation

## 2021-02-23 DIAGNOSIS — Z560 Unemployment, unspecified: Secondary | ICD-10-CM | POA: Diagnosis not present

## 2021-02-23 DIAGNOSIS — Z79899 Other long term (current) drug therapy: Secondary | ICD-10-CM | POA: Diagnosis not present

## 2021-02-23 DIAGNOSIS — F332 Major depressive disorder, recurrent severe without psychotic features: Secondary | ICD-10-CM | POA: Insufficient documentation

## 2021-02-23 DIAGNOSIS — R454 Irritability and anger: Secondary | ICD-10-CM | POA: Insufficient documentation

## 2021-02-23 DIAGNOSIS — F4323 Adjustment disorder with mixed anxiety and depressed mood: Secondary | ICD-10-CM | POA: Diagnosis not present

## 2021-02-23 DIAGNOSIS — Z9151 Personal history of suicidal behavior: Secondary | ICD-10-CM | POA: Diagnosis not present

## 2021-02-23 LAB — CBC WITH DIFFERENTIAL/PLATELET
Abs Immature Granulocytes: 0.04 10*3/uL (ref 0.00–0.07)
Basophils Absolute: 0 10*3/uL (ref 0.0–0.1)
Basophils Relative: 0 %
Eosinophils Absolute: 0.2 10*3/uL (ref 0.0–0.5)
Eosinophils Relative: 2 %
HCT: 36.6 % (ref 36.0–46.0)
Hemoglobin: 11.3 g/dL — ABNORMAL LOW (ref 12.0–15.0)
Immature Granulocytes: 0 %
Lymphocytes Relative: 20 %
Lymphs Abs: 1.9 10*3/uL (ref 0.7–4.0)
MCH: 27.2 pg (ref 26.0–34.0)
MCHC: 30.9 g/dL (ref 30.0–36.0)
MCV: 88 fL (ref 80.0–100.0)
Monocytes Absolute: 0.5 10*3/uL (ref 0.1–1.0)
Monocytes Relative: 5 %
Neutro Abs: 7.1 10*3/uL (ref 1.7–7.7)
Neutrophils Relative %: 73 %
Platelets: 321 10*3/uL (ref 150–400)
RBC: 4.16 MIL/uL (ref 3.87–5.11)
RDW: 13.8 % (ref 11.5–15.5)
WBC: 9.7 10*3/uL (ref 4.0–10.5)
nRBC: 0 % (ref 0.0–0.2)

## 2021-02-23 LAB — COMPREHENSIVE METABOLIC PANEL
ALT: 21 U/L (ref 0–44)
AST: 17 U/L (ref 15–41)
Albumin: 4.2 g/dL (ref 3.5–5.0)
Alkaline Phosphatase: 53 U/L (ref 38–126)
Anion gap: 14 (ref 5–15)
BUN: 10 mg/dL (ref 8–23)
CO2: 26 mmol/L (ref 22–32)
Calcium: 9.7 mg/dL (ref 8.9–10.3)
Chloride: 99 mmol/L (ref 98–111)
Creatinine, Ser: 0.67 mg/dL (ref 0.44–1.00)
GFR, Estimated: >60 mL/min (ref >60)
Glucose, Bld: 132 mg/dL — ABNORMAL HIGH (ref 70–99)
Potassium: 4.3 mmol/L (ref 3.5–5.1)
Sodium: 139 mmol/L (ref 135–145)
Total Bilirubin: 0.5 mg/dL (ref 0.3–1.2)
Total Protein: 7.7 g/dL (ref 6.5–8.1)

## 2021-02-23 LAB — POCT URINE DRUG SCREEN - MANUAL ENTRY (I-SCREEN)
POC Amphetamine UR: NOT DETECTED
POC Buprenorphine (BUP): NOT DETECTED
POC Cocaine UR: NOT DETECTED
POC Marijuana UR: NOT DETECTED
POC Methadone UR: NOT DETECTED
POC Methamphetamine UR: NOT DETECTED
POC Morphine: NOT DETECTED
POC Oxazepam (BZO): NOT DETECTED
POC Oxycodone UR: NOT DETECTED
POC Secobarbital (BAR): NOT DETECTED

## 2021-02-23 LAB — LIPID PANEL
Cholesterol: 217 mg/dL — ABNORMAL HIGH (ref 0–200)
HDL: 40 mg/dL — ABNORMAL LOW (ref >40)
LDL Cholesterol: 117 mg/dL — ABNORMAL HIGH (ref 0–99)
Total CHOL/HDL Ratio: 5.4 RATIO
Triglycerides: 301 mg/dL — ABNORMAL HIGH (ref <150)
VLDL: 60 mg/dL — ABNORMAL HIGH (ref 0–40)

## 2021-02-23 LAB — GLUCOSE, CAPILLARY
Glucose-Capillary: 146 mg/dL — ABNORMAL HIGH (ref 70–99)
Glucose-Capillary: 136 mg/dL — ABNORMAL HIGH (ref 70–99)
Glucose-Capillary: 132 mg/dL — ABNORMAL HIGH (ref 70–99)

## 2021-02-23 LAB — HEMOGLOBIN A1C
Hgb A1c MFr Bld: 7.9 % — ABNORMAL HIGH (ref 4.8–5.6)
Mean Plasma Glucose: 180.03 mg/dL

## 2021-02-23 LAB — POC SARS CORONAVIRUS 2 AG: SARS Coronavirus 2 Ag: NEGATIVE

## 2021-02-23 LAB — RESP PANEL BY RT-PCR (FLU A&B, COVID) ARPGX2
Influenza A by PCR: NEGATIVE
Influenza B by PCR: NEGATIVE
SARS Coronavirus 2 by RT PCR: NEGATIVE

## 2021-02-23 LAB — TSH: TSH: 2.422 u[IU]/mL (ref 0.350–4.500)

## 2021-02-23 LAB — POC SARS CORONAVIRUS 2 AG -  ED: SARS Coronavirus 2 Ag: NEGATIVE

## 2021-02-23 MED ORDER — SERTRALINE HCL 100 MG PO TABS
100.0000 mg | ORAL_TABLET | Freq: Once | ORAL | Status: AC
  Administered 2021-02-23: 100 mg via ORAL
  Filled 2021-02-23: qty 1

## 2021-02-23 MED ORDER — RISPERIDONE 0.5 MG PO TABS
0.5000 mg | ORAL_TABLET | Freq: Every day | ORAL | Status: DC
  Administered 2021-02-23 – 2021-02-24 (×2): 0.5 mg via ORAL
  Filled 2021-02-23 (×2): qty 1

## 2021-02-23 MED ORDER — HYDROXYZINE HCL 25 MG PO TABS
25.0000 mg | ORAL_TABLET | Freq: Three times a day (TID) | ORAL | Status: DC | PRN
  Administered 2021-02-23 – 2021-02-24 (×3): 25 mg via ORAL
  Filled 2021-02-23 (×3): qty 1

## 2021-02-23 MED ORDER — BUSPIRONE HCL 5 MG PO TABS
10.0000 mg | ORAL_TABLET | Freq: Two times a day (BID) | ORAL | Status: DC
  Administered 2021-02-23 – 2021-02-24 (×3): 10 mg via ORAL
  Filled 2021-02-23 (×3): qty 2

## 2021-02-23 MED ORDER — ALUM & MAG HYDROXIDE-SIMETH 200-200-20 MG/5ML PO SUSP: 30.0000 mL | ORAL | Status: DC | PRN

## 2021-02-23 MED ORDER — HYDRALAZINE HCL 10 MG PO TABS: 10.0000 mg | ORAL_TABLET | Freq: Three times a day (TID) | ORAL | Status: DC | PRN

## 2021-02-23 MED ORDER — MAGNESIUM HYDROXIDE 400 MG/5ML PO SUSP: 30.0000 mL | Freq: Every day | ORAL | Status: DC | PRN

## 2021-02-23 MED ORDER — INSULIN ASPART 100 UNIT/ML ~~LOC~~ SOLN
0.0000 [IU] | Freq: Three times a day (TID) | SUBCUTANEOUS | Status: DC
  Administered 2021-02-23 (×2): 1 [IU] via SUBCUTANEOUS
  Administered 2021-02-24: 2 [IU] via SUBCUTANEOUS

## 2021-02-23 MED ORDER — RISPERIDONE 1 MG PO TABS
1.0000 mg | ORAL_TABLET | Freq: Every day | ORAL | Status: DC
  Administered 2021-02-23: 1 mg via ORAL
  Filled 2021-02-23: qty 1

## 2021-02-23 MED ORDER — METFORMIN HCL 500 MG PO TABS
1000.0000 mg | ORAL_TABLET | Freq: Two times a day (BID) | ORAL | Status: DC
  Administered 2021-02-23 – 2021-02-24 (×2): 1000 mg via ORAL
  Filled 2021-02-23 (×2): qty 2

## 2021-02-23 MED ORDER — INSULIN ASPART 100 UNIT/ML ~~LOC~~ SOLN: 0.0000 [IU] | Freq: Every day | SUBCUTANEOUS | Status: DC

## 2021-02-23 MED ORDER — ACETAMINOPHEN 325 MG PO TABS: 650.0000 mg | ORAL_TABLET | Freq: Four times a day (QID) | ORAL | Status: DC | PRN

## 2021-02-23 MED ORDER — TRAZODONE HCL 50 MG PO TABS
50.0000 mg | ORAL_TABLET | Freq: Every evening | ORAL | Status: DC | PRN
  Administered 2021-02-23: 50 mg via ORAL
  Filled 2021-02-23: qty 1

## 2021-02-23 NOTE — ED Notes (Signed)
Pt asleep in bed. Respirations even and unlabored. Will continue to monitor for safety. ?

## 2021-02-23 NOTE — Progress Notes (Signed)
Received Jaclyn [name] in room 128 and explained the admission criteria. She was cooperative and all tasks were completed. She was relocated to the OBS area, CBG-  146 and she received lunch. A copy of her medication list is in the chart. She was medicated by my colleague per order. The skin assessment was completed with incident.
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
## 2021-02-23 NOTE — ED Notes (Signed)
Pt and daughter belongings in locker color coded orange.

## 2021-02-23 NOTE — ED Notes (Signed)
Pt gave verbal permission to talk to daughter Gabriel Rainwater

## 2021-02-23 NOTE — ED Provider Notes (Signed)
Behavioral Health Admission H&P [facility] & OBS)  Date: 02/23/21 Patient Name: Jaclyn [name] MRN: [id number] Chief Complaint:  Chief Complaint  Patient presents with  . Anxiety  . Depression   Chief Complaint/Presenting Problem: Depression, Anxiety, SI  Diagnoses:  Final diagnoses:  None    HPI: History of Present illness: Jaclyn [name] is a 65 y.o. female.  Urgent care facility accompanied by her daughter.  Reports patient became anxious and overwhelmed due to the thought of moving.  Daughter states seeking a larger apartment so that her mother is able to have her own space and this conversation  became too much for patient to handle. "  She started pacing yesterday and stating she wanted to kill herself."  Reported previous inpatient admissions.  States she is currently followed by therapy and psychiatry.  States she was recently started on BuSpar 10 mg p.o. twice daily.  Discussed patient to consider outpatient partial hospitalization programming for additional coping skills.  However patient declined.  Will recommend overnight observation.  Follow-up with psychiatry in the morning.  Will restart home medications where appropriate.  Support, encouragement reassurance was provided.  PHQ 2-9:   [place] ED from 02/23/2021 in [facility] ED from 02/26/2020 in [place] DEPT  C-SSRS RISK CATEGORY High Risk High Risk       Total Time spent with patient: 15 minutes  Musculoskeletal  Strength & Muscle Tone: within normal limits Gait & Station: normal Patient leans: N/A  Psychiatric Specialty Exam  Presentation General Appearance: Appropriate for Environment  Eye Contact:Good  Speech:Clear and Coherent; Slow  Speech Volume:Decreased  Handedness:Right   Mood and Affect  Mood:Anxious; Depressed  Affect:Flat; Depressed   Thought Process  Thought Processes:Coherent  Descriptions of  Associations:Intact  Orientation:Full (Time, Place and Person)  Thought Content:Logical   Hallucinations:Hallucinations: None  Ideas of Reference:None  Suicidal Thoughts:Suicidal Thoughts: Yes, Passive SI Passive Intent and/or Plan: Without Plan  Homicidal Thoughts:Homicidal Thoughts: No   Sensorium  Memory:Immediate Fair; Immediate Good  Judgment:Fair  Insight:Fair   Executive Functions  Concentration:Fair  Attention Span:Fair  [place]   Psychomotor Activity  Psychomotor Activity:Psychomotor Activity: Normal   Assets  Assets:Desire for Improvement   Sleep  Sleep:Sleep: Fair   Nutritional Assessment (For OBS and FBC admissions only) Has the patient had a weight loss or gain of 10 pounds or more in the last 3 months?: No Has the patient had a decrease in food intake/or appetite?: No Does the patient have dental problems?: No Does the patient have eating habits or behaviors that may be indicators of an eating disorder including binging or inducing vomiting?: No Has the patient recently lost weight without trying?: No Has the patient been eating poorly because of a decreased appetite?: No Malnutrition Screening Tool Score: 0    Physical Exam ROS  Blood pressure (!) 169/84, pulse (!) 102, temperature 97.7 F (36.5 C), temperature source Tympanic, resp. rate 16, SpO2 100 %. There is no height or weight on file to calculate BMI.  Past Psychiatric History: Multiple previous inpatient admissions, suicidal behavior, depression, bipolar depression and generalized anxiety  Is the patient at risk to self? Yes  Has the patient been a risk to self in the past 6 months? No .    Has the patient been a risk to self within the distant past? No   Is the patient a risk to others? No   Has the patient been  a risk to others in the past 6 months? No   Has the patient been a risk to others within the distant past? No   Past  Medical History:  Past Medical History:  Diagnosis Date  . Anxiety   . Arthritis   . Depression   . Diabetes mellitus without complication ([place])   . Hypertension     Past Surgical History:  Procedure Laterality Date  . NASAL SEPTUM SURGERY    . TUBAL LIGATION      Family History:  Family History  Problem Relation Age of Onset  . Huntington's disease Father     Social History:  Social History   Socioeconomic History  . Marital status: Single    Spouse name: Not on file  . Number of children: Not on file  . Years of education: Not on file  . Highest education level: Not on file  Occupational History  . Not on file  Tobacco Use  . Smoking status: Never Smoker  . Smokeless tobacco: Never Used  Vaping Use  . Vaping Use: Never used  Substance and Sexual Activity  . Alcohol use: No  . Drug use: No  . Sexual activity: Not Currently  Other Topics Concern  . Not on file  Social History Narrative   Lives with daughter   Social Determinants of Health   Financial Resource Strain: Not on file  Food Insecurity: Not on file  Transportation Needs: Not on file  Physical Activity: Not on file  Stress: Not on file  Social Connections: Not on file  Intimate Partner Violence: Not on file    SDOH:  SDOH Screenings   Alcohol Screen: Not on file  Depression (WUJ8-1): Not on file  Financial Resource Strain: Not on file  Food Insecurity: Not on file  Housing: Not on file  Physical Activity: Not on file  Social Connections: Not on file  Stress: Not on file  Tobacco Use: Low Risk   . Smoking Tobacco Use: Never Smoker  . Smokeless Tobacco Use: Never Used  Transportation Needs: Not on file    Last Labs:  Admission on 02/23/2021  Component Date Value Ref Range Status  . POC Amphetamine UR 02/23/2021 None Detected  NONE DETECTED (Cut Off Level 1000 ng/mL) Final  . POC Secobarbital (BAR) 02/23/2021 None Detected  NONE DETECTED (Cut Off Level 300 ng/mL) Final  . POC  Buprenorphine (BUP) 02/23/2021 None Detected  NONE DETECTED (Cut Off Level 10 ng/mL) Final  . POC Oxazepam (BZO) 02/23/2021 None Detected  NONE DETECTED (Cut Off Level 300 ng/mL) Final  . POC Cocaine UR 02/23/2021 None Detected  NONE DETECTED (Cut Off Level 300 ng/mL) Final  . POC Methamphetamine UR 02/23/2021 None Detected  NONE DETECTED (Cut Off Level 1000 ng/mL) Final  . POC Morphine 02/23/2021 None Detected  NONE DETECTED (Cut Off Level 300 ng/mL) Final  . POC Oxycodone UR 02/23/2021 None Detected  NONE DETECTED (Cut Off Level 100 ng/mL) Final  . POC Methadone UR 02/23/2021 None Detected  NONE DETECTED (Cut Off Level 300 ng/mL) Final  . POC Marijuana UR 02/23/2021 None Detected  NONE DETECTED (Cut Off Level 50 ng/mL) Final  Office Visit on 09/05/2020  Component Date Value Ref Range Status  . Glucose 09/05/2020 104* 65 - 99 mg/dL Final  . BUN 09/05/2020 12  8 - 27 mg/dL Final  . Creatinine, Ser 09/05/2020 0.60  0.57 - 1.00 mg/dL Final  . GFR calc non Af Amer 09/05/2020 98  >59 mL/min/1.73 Final  .  GFR calc Af Amer 09/05/2020 113  >59 mL/min/1.73 Final   Comment: **[facility] currently reports eGFR in compliance with the current**   recommendations of the [employer]. [facility] will   update reporting as new guidelines are published from the NKF-ASN   Task force.   . BUN/Creatinine Ratio 09/05/2020 20  12 - 28 Final  . Sodium 09/05/2020 141  134 - 144 mmol/L Final  . Potassium 09/05/2020 4.9  3.5 - 5.2 mmol/L Final  . Chloride 09/05/2020 101  96 - 106 mmol/L Final  . CO2 09/05/2020 24  20 - 29 mmol/L Final  . Calcium 09/05/2020 9.6  8.7 - 10.3 mg/dL Final    Allergies: Codeine, Flagyl [metronidazole], Metronidazole benzo+syrspend, Morphine and related, and Remeron [mirtazapine]  PTA Medications: (Not in a hospital admission)   Medical Decision Making  Overnight observation for safety due to suicidal ideation with plan -We will restart home meds where  appropriate -Consider follow-up with partial hospitalization for additional coping skills  Recommendations  Based on my evaluation the patient does not appear to have an emergency medical condition.  Derrill Center, NP 02/23/21  11:58 AM

## 2021-02-24 DIAGNOSIS — F4323 Adjustment disorder with mixed anxiety and depressed mood: Secondary | ICD-10-CM | POA: Diagnosis not present

## 2021-02-24 LAB — GLUCOSE, CAPILLARY: Glucose-Capillary: 170 mg/dL — ABNORMAL HIGH (ref 70–99)

## 2021-02-24 LAB — PROLACTIN: Prolactin: 39.9 ng/mL — ABNORMAL HIGH (ref 4.8–23.3)

## 2021-02-24 NOTE — ED Provider Notes (Signed)
FBC/OBS ASAP Discharge Summary  Date and Time: 02/24/2021 10:21 AM  Name: Jaclyn [name]  MRN:  [id number]   Discharge Diagnoses:  Final diagnoses:  Adjustment disorder with mixed anxiety and depressed mood    Subjective: Patient reports today that she is doing better.  She denies any suicidal or homicidal ideations and denies any hallucinations.  Patient reports that she just got overwhelmed yesterday because of talking of moving to a new apartment.  She states that [place] and be closer to things in the city.  She states that she lives with her daughter and that she did get upset and made a comment about wanting to kill herself due to the move.  She states that she has had time to think about it and she was just upset and overwhelmed yesterday.  She states that she felt angry and felt like she needed to be somewhere to be safe.  Patient reports that she is taking her medication as prescribed and that she feels that it does help.  Patient is requesting to be discharged home today. Contacted patient's daughter, Jaclyn [name], and she reports that she has no safety concerns with the patient coming home today.  She confirms the story of the patient becoming overwhelmed due to the move.  She reports that she will be willing to pick the patient up at the facility today.  Stay Summary: Patient is a 65 year old female who presented to the [facility] accompanied by her daughter with reports of worsening anxiety due to changing apartments and made a comment of wanting to kill herself.  Patient reported that she does not feel safe to discharge home.  Refused to go to [facility] program.  Patient was admitted to the continuous observation unit and restarted on her home medications.  Today the patient is denying any suicidal homicidal ideations and denied any hallucinations.  Patient reports that she feels that she is ready to go home and feels better after staying overnight.  Patient's daughter  was contacted for collateral of her life and safety planning and there were no safety concerns with the patient discharging home.  Patient's daughter reports that the patient has plenty of medications at home and does not need any refills.  She also reports that the patient has adequate follow-up treatment and is current with her providers and needs no follow-up at this time.  Patient confirmed the same information.  Patient was discharged home with her daughter.  Total Time spent with patient: 20 minutes  Past Psychiatric History: MDD, anxiety Past Medical History:  Past Medical History:  Diagnosis Date  . Anxiety   . Arthritis   . Depression   . Diabetes mellitus without complication ([facility])   . Hypertension     Past Surgical History:  Procedure Laterality Date  . NASAL SEPTUM SURGERY    . TUBAL LIGATION     Family History:  Family History  Problem Relation Age of Onset  . Huntington's disease Father    Family Psychiatric History: None reported Social History:  Social History   Substance and Sexual Activity  Alcohol Use No     Social History   Substance and Sexual Activity  Drug Use No    Social History   Socioeconomic History  . Marital status: Single    Spouse name: Not on file  . Number of children: Not on file  . Years of education: Not on file  . Highest education level:  Not on file  Occupational History  . Not on file  Tobacco Use  . Smoking status: Never Smoker  . Smokeless tobacco: Never Used  Vaping Use  . Vaping Use: Never used  Substance and Sexual Activity  . Alcohol use: No  . Drug use: No  . Sexual activity: Not Currently  Other Topics Concern  . Not on file  Social History Narrative   Lives with daughter   Social Determinants of Health   Financial Resource Strain: Not on file  Food Insecurity: Not on file  Transportation Needs: Not on file  Physical Activity: Not on file  Stress: Not on file  Social Connections: Not on file   SDOH:   SDOH Screenings   Alcohol Screen: Not on file  Depression (RAQ7-6): Not on file  Financial Resource Strain: Not on file  Food Insecurity: Not on file  Housing: Not on file  Physical Activity: Not on file  Social Connections: Not on file  Stress: Not on file  Tobacco Use: Low Risk   . Smoking Tobacco Use: Never Smoker  . Smokeless Tobacco Use: Never Used  Transportation Needs: Not on file    Has this patient used any form of tobacco in the last 30 days? (Cigarettes, Smokeless Tobacco, Cigars, and/or Pipes) A prescription for an FDA-approved tobacco cessation medication was offered at discharge and the patient refused  Current Medications:  Current Facility-Administered Medications  Medication Dose Route Frequency Provider Last Rate Last Admin  . acetaminophen (TYLENOL) tablet 650 mg  650 mg Oral Q6H PRN Oneta Rack, NP      . alum & mag hydroxide-simeth (MAALOX/MYLANTA) 200-200-20 MG/5ML suspension 30 mL  30 mL Oral Q4H PRN Oneta Rack, NP      . busPIRone (BUSPAR) tablet 10 mg  10 mg Oral BID Oneta Rack, NP   10 mg at 02/24/21 0859  . hydrALAZINE (APRESOLINE) tablet 10 mg  10 mg Oral Q8H PRN Oneta Rack, NP      . hydrOXYzine (ATARAX/VISTARIL) tablet 25 mg  25 mg Oral TID PRN Oneta Rack, NP   25 mg at 02/24/21 0601  . insulin aspart (novoLOG) injection 0-5 Units  0-5 Units Subcutaneous QHS Lewis, Tanika N, NP      . insulin aspart (novoLOG) injection 0-9 Units  0-9 Units Subcutaneous TID WC Oneta Rack, NP   2 Units at 02/24/21 0859  . magnesium hydroxide (MILK OF MAGNESIA) suspension 30 mL  30 mL Oral Daily PRN Oneta Rack, NP      . metFORMIN (GLUCOPHAGE) tablet 1,000 mg  1,000 mg Oral BID WC Oneta Rack, NP   1,000 mg at 02/24/21 0900  . risperiDONE (RISPERDAL) tablet 0.5 mg  0.5 mg Oral Daily Hillery Jacks N, NP   0.5 mg at 02/24/21 0900   And  . risperiDONE (RISPERDAL) tablet 1 mg  1 mg Oral QHS Oneta Rack, NP   1 mg at 02/23/21 2134  .  traZODone (DESYREL) tablet 50 mg  50 mg Oral QHS PRN Oneta Rack, NP   50 mg at 02/23/21 2131   Current Outpatient Medications  Medication Sig Dispense Refill  . busPIRone (BUSPAR) 10 MG tablet Take 10 mg by mouth 2 (two) times daily.  1  . Insulin Detemir (LEVEMIR FLEXTOUCH) 100 UNIT/ML Pen Inject 28 Units into the skin daily at 10 pm. (Patient taking differently: Inject 30 Units into the skin 2 (two) times daily.) 9 mL 0  .  metFORMIN (GLUCOPHAGE) 1000 MG tablet TAKE ONE TABLET BY MOUTH TWICE DAILY WITH  A  MEAL (Patient taking differently: Take 1,000 mg by mouth 2 (two) times daily with a meal.) 180 tablet 3  . Multiple Vitamin (MULTIVITAMIN WITH MINERALS) TABS tablet Take 1 tablet by mouth daily.    . Olmesartan-amLODIPine-HCTZ (TRIBENZOR) 40-10-25 MG TABS One tablet by mouth daily 90 tablet 3  . omeprazole (PRILOSEC) 40 MG capsule Take 40 mg by mouth daily.    . risperiDONE (RISPERDAL) 0.5 MG tablet TAKE ONE TABLET BY MOUTH TWICE DAILY FOR MOOD  CONTROL. (Patient taking differently: Take 0.5-1 mg by mouth See admin instructions. 0.5mg  in AM and 1mg  in evening) 180 tablet 0  . sertraline (ZOLOFT) 100 MG tablet Take 200 mg by mouth daily.     glucose blood test strip Use to check blood sugar twice daily (Patient taking differently: 1 each by Other route as needed (blood sugar). One touch verio--Use to check blood sugar twice daily) 100 each 5  . Insulin Pen Needle (BD PEN NEEDLE NANO U/F) 32G X 4 MM MISC Test daily. 100 each 5    PTA Medications: (Not in a hospital admission)   Musculoskeletal  Strength & Muscle Tone: within normal limits Gait & Station: normal Patient leans: N/A  Psychiatric Specialty Exam  Presentation  General Appearance: Appropriate for Environment; Casual  Eye Contact:Good  Speech:Clear and Coherent; Normal Rate  Speech Volume:Normal  Handedness:Right   Mood and Affect  Mood:Anxious  Affect:Appropriate; Congruent   Thought Process  Thought  Processes:Coherent  Descriptions of Associations:Intact  Orientation:Full (Time, Place and Person)  Thought Content:WDL  Hallucinations:Hallucinations: None  Ideas of Reference:None  Suicidal Thoughts:Suicidal Thoughts: No SI Passive Intent and/or Plan: Without Plan  Homicidal Thoughts:Homicidal Thoughts: No   Sensorium  Memory:Immediate Good; Recent Good; Remote Good  Judgment:Good  Insight:Good   Executive Functions  Concentration:Good  Attention Span:Good  Recall:Good  Fund of Knowledge:Good  Language:Good   Psychomotor Activity  Psychomotor Activity:Psychomotor Activity: Normal   Assets  Assets:Communication Skills; Desire for Improvement; Financial Resources/Insurance; Housing; Social Support; Transportation   Sleep  Sleep:Sleep: Good   Nutritional Assessment (For OBS and FBC admissions only) Has the patient had a weight loss or gain of 10 pounds or more in the last 3 months?: No Has the patient had a decrease in food intake/or appetite?: No Does the patient have dental problems?: No Does the patient have eating habits or behaviors that may be indicators of an eating disorder including binging or inducing vomiting?: No Has the patient recently lost weight without trying?: No Has the patient been eating poorly because of a decreased appetite?: No Malnutrition Screening Tool Score: 0    Physical Exam  Physical Exam Vitals and nursing note reviewed.  Constitutional:      Appearance: She is well-developed.  HENT:     Head: Normocephalic.  Eyes:     Pupils: Pupils are equal, round, and reactive to light.  Cardiovascular:     Rate and Rhythm: Normal rate.  Pulmonary:     Effort: Pulmonary effort is normal.  Musculoskeletal:        General: Normal range of motion.  Neurological:     Mental Status: She is alert and oriented to person, place, and time.    Review of Systems  Constitutional: Negative.   HENT: Negative.   Eyes: Negative.    Respiratory: Negative.   Cardiovascular: Negative.   Gastrointestinal: Negative.   Genitourinary: Negative.   Musculoskeletal: Negative.  Skin: Negative.   Neurological: Negative.   Endo/Heme/Allergies: Negative.   Psychiatric/Behavioral: The patient is nervous/anxious.    Blood pressure (!) 146/67, pulse 97, temperature 97.6 F (36.4 C), temperature source Tympanic, resp. rate 16, SpO2 96 %. There is no height or weight on file to calculate BMI.  Demographic Factors:  Caucasian  Loss Factors: NA  Historical Factors: NA  Risk Reduction Factors:   Sense of responsibility to family, Religious beliefs about death, Living with another person, especially a relative, Positive social support and Positive therapeutic relationship  Continued Clinical Symptoms:  Previous Psychiatric Diagnoses and Treatments  Cognitive Features That Contribute To Risk:  None    Suicide Risk:  Minimal: No identifiable suicidal ideation.  Patients presenting with no risk factors but with morbid ruminations; may be classified as minimal risk based on the severity of the depressive symptoms  Plan Of Care/Follow-up recommendations:  Continue activity as tolerated. Continue diet as recommended by your PCP. Ensure to keep all appointments with outpatient providers.  Disposition: Discharge home to daughter  Maryfrances Bunnellravis B Marina Desire, FNP 02/24/2021, 10:21 AM

## 2021-02-24 NOTE — Discharge Instructions (Addendum)

## 2021-02-24 NOTE — Discharge Summary (Signed)
Jaclyn [name] to be D/C'd home per NP order. Discussed with the patient and all questions fully answered. An After Visit Summary was printed and given to the patient.  Patient escorted out and D/C home via private auto.  [name]  02/24/2021 10:52 AM

## 2021-02-24 NOTE — ED Notes (Signed)
Pt resting in bed. No signs of acute distress noted. Pt denies any needs. Will continue to monitor for safety.

## 2021-02-24 NOTE — ED Notes (Signed)
Pt asleep in bed. Respirations even and unlabored. Will continue to monitor for safety. ?

## 2021-02-24 NOTE — Progress Notes (Signed)
Pt is sitting quietly. Pt did not voice any complaints of pain or discomfort. Pt received meds with no incident. Pt denies SI, HI and AVH at this time. Pt's safety is maintained.

## 2021-02-24 NOTE — ED Notes (Signed)
Pt up requesting Vistaril. PRN given, see MAR.

## 2021-03-09 ENCOUNTER — Other Ambulatory Visit: Payer: Self-pay

## 2021-03-09 ENCOUNTER — Ambulatory Visit (HOSPITAL_COMMUNITY)
Admission: EM | Admit: 2021-03-09 | Discharge: 2021-03-11 | Disposition: A | Discharge status: Home / Self Care | Payer: Medicare Other | Attending: Psychiatry | Admitting: Psychiatry

## 2021-03-09 DIAGNOSIS — Z9151 Personal history of suicidal behavior: Secondary | ICD-10-CM | POA: Insufficient documentation

## 2021-03-09 DIAGNOSIS — R45851 Suicidal ideations: Secondary | ICD-10-CM | POA: Insufficient documentation

## 2021-03-09 DIAGNOSIS — Z79899 Other long term (current) drug therapy: Secondary | ICD-10-CM | POA: Diagnosis not present

## 2021-03-09 DIAGNOSIS — F419 Anxiety disorder, unspecified: Secondary | ICD-10-CM | POA: Diagnosis not present

## 2021-03-09 DIAGNOSIS — F322 Major depressive disorder, single episode, severe without psychotic features: Secondary | ICD-10-CM | POA: Diagnosis not present

## 2021-03-09 DIAGNOSIS — Z62810 Personal history of physical and sexual abuse in childhood: Secondary | ICD-10-CM | POA: Insufficient documentation

## 2021-03-09 DIAGNOSIS — Z20822 Contact with and (suspected) exposure to covid-19: Secondary | ICD-10-CM | POA: Diagnosis not present

## 2021-03-09 DIAGNOSIS — F332 Major depressive disorder, recurrent severe without psychotic features: Secondary | ICD-10-CM | POA: Diagnosis not present

## 2021-03-09 LAB — CBC WITH DIFFERENTIAL/PLATELET
Abs Immature Granulocytes: 0.04 10*3/uL (ref 0.00–0.07)
Basophils Absolute: 0 10*3/uL (ref 0.0–0.1)
Basophils Relative: 0 %
Eosinophils Absolute: 0.1 10*3/uL (ref 0.0–0.5)
Eosinophils Relative: 1 %
HCT: 38.3 % (ref 36.0–46.0)
Hemoglobin: 12 g/dL (ref 12.0–15.0)
Immature Granulocytes: 0 %
Lymphocytes Relative: 23 %
Lymphs Abs: 2.2 10*3/uL (ref 0.7–4.0)
MCH: 27.5 pg (ref 26.0–34.0)
MCHC: 31.3 g/dL (ref 30.0–36.0)
MCV: 87.6 fL (ref 80.0–100.0)
Monocytes Absolute: 0.6 10*3/uL (ref 0.1–1.0)
Monocytes Relative: 6 %
Neutro Abs: 6.8 10*3/uL (ref 1.7–7.7)
Neutrophils Relative %: 70 %
Platelets: 336 10*3/uL (ref 150–400)
RBC: 4.37 MIL/uL (ref 3.87–5.11)
RDW: 14.1 % (ref 11.5–15.5)
WBC: 9.8 10*3/uL (ref 4.0–10.5)
nRBC: 0 % (ref 0.0–0.2)

## 2021-03-09 LAB — TSH: TSH: 2.351 u[IU]/mL (ref 0.350–4.500)

## 2021-03-09 LAB — POCT URINE DRUG SCREEN - MANUAL ENTRY (I-SCREEN)
POC Amphetamine UR: NOT DETECTED
POC Buprenorphine (BUP): NOT DETECTED
POC Cocaine UR: NOT DETECTED
POC Marijuana UR: NOT DETECTED
POC Methadone UR: NOT DETECTED
POC Methamphetamine UR: NOT DETECTED
POC Morphine: NOT DETECTED
POC Oxazepam (BZO): NOT DETECTED
POC Oxycodone UR: NOT DETECTED
POC Secobarbital (BAR): NOT DETECTED

## 2021-03-09 LAB — HEMOGLOBIN A1C
Hgb A1c MFr Bld: 7.7 % — ABNORMAL HIGH (ref 4.8–5.6)
Mean Plasma Glucose: 174.29 mg/dL

## 2021-03-09 LAB — RESP PANEL BY RT-PCR (FLU A&B, COVID) ARPGX2
Influenza A by PCR: NEGATIVE
Influenza B by PCR: NEGATIVE
SARS Coronavirus 2 by RT PCR: NEGATIVE

## 2021-03-09 LAB — LIPID PANEL
Cholesterol: 251 mg/dL — ABNORMAL HIGH (ref 0–200)
HDL: 45 mg/dL (ref >40)
LDL Cholesterol: 142 mg/dL — ABNORMAL HIGH (ref 0–99)
Total CHOL/HDL Ratio: 5.6 RATIO
Triglycerides: 319 mg/dL — ABNORMAL HIGH (ref <150)
VLDL: 64 mg/dL — ABNORMAL HIGH (ref 0–40)

## 2021-03-09 LAB — GLUCOSE, CAPILLARY
Glucose-Capillary: 97 mg/dL (ref 70–99)
Glucose-Capillary: 163 mg/dL — ABNORMAL HIGH (ref 70–99)
Glucose-Capillary: 148 mg/dL — ABNORMAL HIGH (ref 70–99)
Glucose-Capillary: 90 mg/dL (ref 70–99)

## 2021-03-09 LAB — COMPREHENSIVE METABOLIC PANEL
ALT: 21 U/L (ref 0–44)
AST: 18 U/L (ref 15–41)
Albumin: 4.3 g/dL (ref 3.5–5.0)
Alkaline Phosphatase: 57 U/L (ref 38–126)
Anion gap: 10 (ref 5–15)
BUN: 13 mg/dL (ref 8–23)
CO2: 30 mmol/L (ref 22–32)
Calcium: 9.9 mg/dL (ref 8.9–10.3)
Chloride: 99 mmol/L (ref 98–111)
Creatinine, Ser: 0.73 mg/dL (ref 0.44–1.00)
GFR, Estimated: >60 mL/min (ref >60)
Glucose, Bld: 94 mg/dL (ref 70–99)
Potassium: 3.9 mmol/L (ref 3.5–5.1)
Sodium: 139 mmol/L (ref 135–145)
Total Bilirubin: 0.4 mg/dL (ref 0.3–1.2)
Total Protein: 7.7 g/dL (ref 6.5–8.1)

## 2021-03-09 LAB — POC SARS CORONAVIRUS 2 AG -  ED: SARS Coronavirus 2 Ag: NEGATIVE

## 2021-03-09 LAB — POC SARS CORONAVIRUS 2 AG: SARS Coronavirus 2 Ag: NEGATIVE

## 2021-03-09 MED ORDER — PANTOPRAZOLE SODIUM 40 MG PO TBEC
40.0000 mg | DELAYED_RELEASE_TABLET | Freq: Every day | ORAL | Status: DC
  Administered 2021-03-09 – 2021-03-11 (×3): 40 mg via ORAL
  Filled 2021-03-09 (×3): qty 1

## 2021-03-09 MED ORDER — RISPERIDONE 0.5 MG PO TABS
0.5000 mg | ORAL_TABLET | Freq: Every day | ORAL | Status: DC
  Administered 2021-03-09 – 2021-03-11 (×3): 0.5 mg via ORAL
  Filled 2021-03-09 (×3): qty 1

## 2021-03-09 MED ORDER — ADULT MULTIVITAMIN W/MINERALS CH
1.0000 | ORAL_TABLET | Freq: Every day | ORAL | Status: DC
  Administered 2021-03-09 – 2021-03-11 (×3): 1 via ORAL
  Filled 2021-03-09 (×3): qty 1

## 2021-03-09 MED ORDER — MAGNESIUM HYDROXIDE 400 MG/5ML PO SUSP: 30.0000 mL | Freq: Every day | ORAL | Status: DC | PRN

## 2021-03-09 MED ORDER — ACETAMINOPHEN 325 MG PO TABS: 650.0000 mg | ORAL_TABLET | Freq: Four times a day (QID) | ORAL | Status: DC | PRN

## 2021-03-09 MED ORDER — RISPERIDONE 0.5 MG PO TABS: 0.5000 mg | ORAL_TABLET | ORAL | Status: DC

## 2021-03-09 MED ORDER — HYDROXYZINE HCL 25 MG PO TABS
25.0000 mg | ORAL_TABLET | Freq: Three times a day (TID) | ORAL | Status: DC | PRN
  Administered 2021-03-10: 25 mg via ORAL
  Filled 2021-03-09: qty 1

## 2021-03-09 MED ORDER — HYDROCHLOROTHIAZIDE 25 MG PO TABS
25.0000 mg | ORAL_TABLET | Freq: Every day | ORAL | Status: DC
  Administered 2021-03-09 – 2021-03-11 (×3): 25 mg via ORAL
  Filled 2021-03-09 (×3): qty 1

## 2021-03-09 MED ORDER — ALUM & MAG HYDROXIDE-SIMETH 200-200-20 MG/5ML PO SUSP
30.0000 mL | ORAL | Status: DC | PRN
Start: 2021-03-09 — End: 2021-03-11
  Administered 2021-03-10: 30 mL via ORAL
  Filled 2021-03-09: qty 30

## 2021-03-09 MED ORDER — OLMESARTAN-AMLODIPINE-HCTZ 40-10-25 MG PO TABS: ORAL_TABLET | Freq: Every day | ORAL | Status: DC

## 2021-03-09 MED ORDER — BUSPIRONE HCL 5 MG PO TABS
10.0000 mg | ORAL_TABLET | Freq: Three times a day (TID) | ORAL | Status: DC
  Administered 2021-03-09 – 2021-03-11 (×6): 10 mg via ORAL
  Filled 2021-03-09 (×6): qty 2

## 2021-03-09 MED ORDER — SERTRALINE HCL 100 MG PO TABS
200.0000 mg | ORAL_TABLET | Freq: Every day | ORAL | Status: DC
  Administered 2021-03-09 – 2021-03-11 (×3): 200 mg via ORAL
  Filled 2021-03-09 (×3): qty 2

## 2021-03-09 MED ORDER — INSULIN DETEMIR 100 UNIT/ML FLEXPEN: 30.0000 [IU] | PEN_INJECTOR | Freq: Two times a day (BID) | SUBCUTANEOUS | Status: DC

## 2021-03-09 MED ORDER — BUSPIRONE HCL 5 MG PO TABS: 10.0000 mg | ORAL_TABLET | Freq: Two times a day (BID) | ORAL | Status: DC

## 2021-03-09 MED ORDER — INSULIN DETEMIR 100 UNIT/ML ~~LOC~~ SOLN
30.0000 [IU] | Freq: Two times a day (BID) | SUBCUTANEOUS | Status: DC
  Administered 2021-03-09 – 2021-03-10 (×3): 30 [IU] via SUBCUTANEOUS

## 2021-03-09 MED ORDER — METFORMIN HCL 500 MG PO TABS
1000.0000 mg | ORAL_TABLET | Freq: Two times a day (BID) | ORAL | Status: DC
Start: 2021-03-09 — End: 2021-03-11
  Administered 2021-03-09 – 2021-03-11 (×4): 1000 mg via ORAL
  Filled 2021-03-09 (×4): qty 2

## 2021-03-09 MED ORDER — RISPERIDONE 1 MG PO TABS
1.0000 mg | ORAL_TABLET | Freq: Every day | ORAL | Status: DC
  Administered 2021-03-09 – 2021-03-10 (×2): 1 mg via ORAL
  Filled 2021-03-09 (×2): qty 1

## 2021-03-09 MED ORDER — IRBESARTAN 75 MG PO TABS
300.0000 mg | ORAL_TABLET | Freq: Every day | ORAL | Status: DC
  Administered 2021-03-09 – 2021-03-11 (×3): 300 mg via ORAL
  Filled 2021-03-09 (×3): qty 4

## 2021-03-09 MED ORDER — AMLODIPINE BESYLATE 10 MG PO TABS
10.0000 mg | ORAL_TABLET | Freq: Every day | ORAL | Status: DC
  Administered 2021-03-09 – 2021-03-11 (×3): 10 mg via ORAL
  Filled 2021-03-09 (×3): qty 1

## 2021-03-09 NOTE — ED Notes (Signed)
Pt stated that she is hearing voices, "To hell with you". Pt has been resting on the pull out bed w/o any distress.

## 2021-03-09 NOTE — Progress Notes (Signed)
Jaclyn [name] ate her snack and one container of orange juice prior to her nighttime insulin.

## 2021-03-09 NOTE — ED Notes (Signed)
Orange locker  

## 2021-03-09 NOTE — Progress Notes (Signed)
Patient is alert and oriented, watching television, no objective signs of distress or discomfort at this time.

## 2021-03-09 NOTE — ED Notes (Signed)
Patient given graham crackers and peanut butter. 
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
## 2021-03-09 NOTE — Progress Notes (Signed)
Patient brought into [facility] by daughter due to "Not wanting to live anymore." Patient expresses chronic depression. Patient oriented to observation unit, offered food and toiletng. No objective signs of distress at this time. Skin is intact, except for random bruises from insulin self injection on thighs.

## 2021-03-09 NOTE — Progress Notes (Signed)
65 year old female present to [facility] accompanied by her daughter. Patient report she feels suicidal triggered by life stressors, depression and anxiety. Patient suicidal with no plan. Patient denied auditory/visual hallucinations and denied feelings of paranoia. Patient report she has a psychiatrist and was not seen Friday 03/07/2021 by Dr. Lilian Kapur. Per patient's daughter patient is not taking mental health medication and prescribed.

## 2021-03-09 NOTE — ED Notes (Signed)
Pt sleeping@this  time. Alert and orient x 4. Breathing even and unlabored. Will continue to monitor for safety

## 2021-03-09 NOTE — ED Notes (Signed)
Pt given meal
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
## 2021-03-10 DIAGNOSIS — Z79899 Other long term (current) drug therapy: Secondary | ICD-10-CM | POA: Diagnosis not present

## 2021-03-10 DIAGNOSIS — F332 Major depressive disorder, recurrent severe without psychotic features: Secondary | ICD-10-CM | POA: Diagnosis not present

## 2021-03-10 DIAGNOSIS — Z20822 Contact with and (suspected) exposure to covid-19: Secondary | ICD-10-CM | POA: Diagnosis not present

## 2021-03-10 DIAGNOSIS — Z9151 Personal history of suicidal behavior: Secondary | ICD-10-CM | POA: Diagnosis not present

## 2021-03-10 LAB — GLUCOSE, CAPILLARY
Glucose-Capillary: 54 mg/dL — ABNORMAL LOW (ref 70–99)
Glucose-Capillary: 82 mg/dL (ref 70–99)
Glucose-Capillary: 169 mg/dL — ABNORMAL HIGH (ref 70–99)
Glucose-Capillary: 202 mg/dL — ABNORMAL HIGH (ref 70–99)
Glucose-Capillary: 153 mg/dL — ABNORMAL HIGH (ref 70–99)

## 2021-03-10 LAB — PROLACTIN: Prolactin: 75.9 ng/mL — ABNORMAL HIGH (ref 4.8–23.3)

## 2021-03-10 MED ORDER — TRAZODONE HCL 50 MG PO TABS
50.0000 mg | ORAL_TABLET | Freq: Every evening | ORAL | Status: DC | PRN
  Administered 2021-03-10: 50 mg via ORAL
  Filled 2021-03-10: qty 1

## 2021-03-10 NOTE — ED Notes (Signed)
Pt sleeping@this time. Breathing even and unlabored. Will continue to monitor for safety 

## 2021-03-10 NOTE — Progress Notes (Addendum)
Pt accepted to [facility]  Patient meets inpatient criteria per Berneice Heinrich, NP.  Dr. Theophilus Bones, MD is the attending provider.    Call report to [phone]    Laretta Alstrom, LPN @ [facility] notified.     Pt is voluntary.    Pt may be transported by [employer]  Pt scheduled  to arrive at [facility] at 0900 AM on 3/15   Signed:  Corky Crafts, MSW, [place], LCASA 03/10/2021 3:50 PM

## 2021-03-10 NOTE — ED Notes (Signed)
Pt sitting on the side of the pull out bed staring into the air. No distress observed at this time. Safety maintained and will continue to monitor.

## 2021-03-10 NOTE — ED Notes (Signed)
Pt calm and cooperative@this time. Alert and orient x 4. Will continue to monitor for safety 

## 2021-03-10 NOTE — Progress Notes (Signed)
Per [facility], facility is at capacity due to staffing shortage. Disposition CSW referred patient out at this time.   Pt. meets criteria for inpatient treatment per Karsten Ro, MD. Referred out to the following hospitals:   Destination  Service Provider Address Phone Fax  [facility]  [address] [place] [address] [phone]  [facility] [address]  [address] [place] [address] [phone]  [facility]  [address] [place] [address] [phone]  [facility]  [address] [place], [place] [address] [phone]  [facility]  [address] [place] [address] [place] [address] [phone]  [facility]  [address] [place], [place] [address] [phone]  [facility]  [address], [place] [address] [phone]  [facility]  [address] [place] [address] [phone]  [facility]  [address] [place] [address] [phone]  [facility]  [address], [place] [address] [phone]  [facility]  [address], [place] [address] [phone]      Disposition CSW will continue to follow for placement.   Signed:  Corky Crafts, MSW, [place], LCASA 03/10/2021 2:21 PM

## 2021-03-10 NOTE — ED Notes (Addendum)
Pt just sat up on side of her bed. Pt has been restless throughout the night. Alert and orient x 4. Will continue to monitor for safey

## 2021-03-10 NOTE — ED Notes (Signed)
CBG 54 this morning. Pt given juice and rechecked 15 min later. CBG at that time was 84. Held scheduled insulin per T. Arlana Pouch NP and Pt request also. Accepted morning PO meds w/o difficulty. Currenlty resting on pull out bed. Safety maintained and will continue to monitor.

## 2021-03-10 NOTE — ED Provider Notes (Signed)
Behavioral Health Progress Note  Date and Time: 03/10/2021 11:16 AM Name: Jaclyn [name] MRN:  [id number]  Subjective:  States she did not sleep at all last night and has been anxious consistent with nursing notes of patient being "restless" throughout the night. She states she feels very down today "like I don't care about anything. I'm just done feeling this way." Denies any change in depressive and anxiety symptoms since admission (see H&P). She currently has active SI with a plan to overdose on her medicine if she discharges from the [facility]. She denies HI and A&V hallucinations today.  Patient is a poor historian and is very soft spoken and has a significant delay before responding.  Per patient her father has a history of Huntington's disease, she does not recall his onset of symptoms, and does not recall how old he was. She says he was "older" when he was diagnosed, and "older" when he died.  Noted to be slow to rise when standing, has a wide steady gait and looks downward as she walks.  She is unable to heel-toe walking or walk with toes pointing outward stating "I can't, I will fall." Rapid alternating movement with hands is very slow and appears to take a lot of concentration. MMSE completed with a score of 15. Unable to recall 3 words after 5 minutes. Endorses decrease in coordination, 1 recent fall in her yard, and memory impairment.  States these symptoms have occurred over the last year.  When asked about depressive and anxiety symptoms (consistent with H&P) she said these have had progressive unable to identify any main stressor stating, "Even the smallest thing gets me sad, angry, and irritated."   Patient meets inpatient criteria due to SI with plan (to overdose on her medicine), means, and intent. agrees to remain at [facility] pending adult behavioral inpatient bed. Encouraged to follow up with outpatient primary care to discuss balance, memory, & coordination issues.  Diagnosis:  Final diagnoses:   Current severe episode of major depressive disorder without psychotic features without prior episode (HCC)    Total Time spent with patient: 30 minutes  Past Psychiatric History: depression, bipolar depression, suicidal behavior Past Medical History:  Past Medical History:  Diagnosis Date  . Anxiety   . Arthritis   . Depression   . Diabetes mellitus without complication (HCC)   . Hypertension     Past Surgical History:  Procedure Laterality Date  . NASAL SEPTUM SURGERY    . TUBAL LIGATION     Family History:  Family History  Problem Relation Age of Onset  . Huntington's disease Father    Family Psychiatric  History: none reported Social History:  Social History   Substance and Sexual Activity  Alcohol Use No     Social History   Substance and Sexual Activity  Drug Use No    Social History   Socioeconomic History  . Marital status: Single    Spouse name: Not on file  . Number of children: Not on file  . Years of education: Not on file  . Highest education level: Not on file  Occupational History  . Not on file  Tobacco Use  . Smoking status: Never Smoker  . Smokeless tobacco: Never Used  Vaping Use  . Vaping Use: Never used  Substance and Sexual Activity  . Alcohol use: No  . Drug use: No  . Sexual activity: Not Currently  Other Topics Concern  . Not on file  Social History Narrative  Lives with daughter   Social Determinants of Health   Financial Resource Strain: Not on file  Food Insecurity: Not on file  Transportation Needs: Not on file  Physical Activity: Not on file  Stress: Not on file  Social Connections: Not on file   SDOH:  SDOH Screenings   Alcohol Screen: Not on file  Depression (ZOX0-9): Not on file  Financial Resource Strain: Not on file  Food Insecurity: Not on file  Housing: Not on file  Physical Activity: Not on file  Social Connections: Not on file  Stress: Not on file  Tobacco Use: Low Risk   . Smoking Tobacco Use:  Never Smoker  . Smokeless Tobacco Use: Never Used  Transportation Needs: Not on file   Additional Social History:    Pain Medications: See MAR Prescriptions: See MAR Over the Counter: See MAR History of alcohol / drug use?: No history of alcohol / drug abuse                    Sleep: Poor  Appetite:  Fair  Current Medications:  Current Facility-Administered Medications  Medication Dose Route Frequency Provider Last Rate Last Admin  . acetaminophen (TYLENOL) tablet 650 mg  650 mg Oral Q6H PRN Doda, Vandana, MD      . alum & mag hydroxide-simeth (MAALOX/MYLANTA) 200-200-20 MG/5ML suspension 30 mL  30 mL Oral Q4H PRN Karsten Ro, MD   30 mL at 03/10/21 0104  . amLODipine (NORVASC) tablet 10 mg  10 mg Oral Daily Patrcia Dolly, FNP   10 mg at 03/10/21 0849   And  . hydrochlorothiazide (HYDRODIURIL) tablet 25 mg  25 mg Oral Daily Patrcia Dolly, FNP   25 mg at 03/10/21 0849   And  . irbesartan (AVAPRO) tablet 300 mg  300 mg Oral Daily Patrcia Dolly, FNP   300 mg at 03/10/21 0851  . busPIRone (BUSPAR) tablet 10 mg  10 mg Oral TID Karsten Ro, MD   10 mg at 03/10/21 0850  . hydrOXYzine (ATARAX/VISTARIL) tablet 25 mg  25 mg Oral TID PRN Karsten Ro, MD      . insulin detemir (LEVEMIR) injection 30 Units  30 Units Subcutaneous BID Patrcia Dolly, FNP   30 Units at 03/09/21 2216  . magnesium hydroxide (MILK OF MAGNESIA) suspension 30 mL  30 mL Oral Daily PRN Karsten Ro, MD      . metFORMIN (GLUCOPHAGE) tablet 1,000 mg  1,000 mg Oral BID WC Karsten Ro, MD   1,000 mg at 03/10/21 0848  . multivitamin with minerals tablet 1 tablet  1 tablet Oral Daily Karsten Ro, MD   1 tablet at 03/10/21 0851  . pantoprazole (PROTONIX) EC tablet 40 mg  40 mg Oral Daily Karsten Ro, MD   40 mg at 03/10/21 0850  . risperiDONE (RISPERDAL) tablet 0.5 mg  0.5 mg Oral Daily Doda, Vandana, MD   0.5 mg at 03/10/21 0850   And  . risperiDONE (RISPERDAL) tablet 1 mg  1 mg Oral QHS Karsten Ro, MD   1  mg at 03/09/21 2217  . sertraline (ZOLOFT) tablet 200 mg  200 mg Oral Daily Karsten Ro, MD   200 mg at 03/10/21 6045   Current Outpatient Medications  Medication Sig Dispense Refill  . busPIRone (BUSPAR) 10 MG tablet Take 10 mg by mouth 2 (two) times daily.  1  . glucose blood test strip Use to check blood sugar twice daily (Patient taking differently: 1 each by  Other route as needed (blood sugar). One touch verio--Use to check blood sugar twice daily) 100 each 5  . Insulin Detemir (LEVEMIR FLEXTOUCH) 100 UNIT/ML Pen Inject 28 Units into the skin daily at 10 pm. (Patient taking differently: Inject 30 Units into the skin 2 (two) times daily.) 9 mL 0  . Insulin Pen Needle (BD PEN NEEDLE NANO U/F) 32G X 4 MM MISC Test daily. 100 each 5  . metFORMIN (GLUCOPHAGE) 1000 MG tablet TAKE ONE TABLET BY MOUTH TWICE DAILY WITH  A  MEAL (Patient taking differently: Take 1,000 mg by mouth 2 (two) times daily with a meal.) 180 tablet 3  . Multiple Vitamin (MULTIVITAMIN WITH MINERALS) TABS tablet Take 1 tablet by mouth daily.    . Olmesartan-amLODIPine-HCTZ (TRIBENZOR) 40-10-25 MG TABS One tablet by mouth daily 90 tablet 3  . omeprazole (PRILOSEC) 40 MG capsule Take 40 mg by mouth daily.    . risperiDONE (RISPERDAL) 0.5 MG tablet TAKE ONE TABLET BY MOUTH TWICE DAILY FOR MOOD  CONTROL. (Patient taking differently: Take 0.5-1 mg by mouth See admin instructions. 0.5mg  in AM and  in evening) 180 tablet 0  . sertraline (ZOLOFT) 100 MG tablet Take 200 mg by mouth daily.       Labs  Lab Results:  Admission on 03/09/2021  Component Date Value Ref Range Status  . SARS Coronavirus 2 by RT PCR 03/09/2021 NEGATIVE  NEGATIVE Final   Comment: (NOTE) SARS-CoV-2 target nucleic acids are NOT DETECTED.  The SARS-CoV-2 RNA is generally detectable in upper respiratory specimens during the acute phase of infection. The lowest concentration of SARS-CoV-2 viral copies this assay can detect is 138 copies/mL. A negative  result does not preclude SARS-Cov-2 infection and should not be used as the sole basis for treatment or other patient management decisions. A negative result may occur with  improper specimen collection/handling, submission of specimen other than nasopharyngeal swab, presence of viral mutation(s) within the areas targeted by this assay, and inadequate number of viral copies(<138 copies/mL). A negative result must be combined with clinical observations, patient history, and epidemiological information. The expected result is Negative.  Fact Sheet for Patients:  BloggerCourse.com  Fact Sheet for Healthcare Providers:  SeriousBroker.it  This test is no                          t yet approved or cleared by the [place] FDA and  has been authorized for detection and/or diagnosis of SARS-CoV-2 by FDA under an Emergency Use Authorization (EUA). This EUA will remain  in effect (meaning this test can be used) for the duration of the COVID-19 declaration under Section 564(b)(1) of the Act, 21 U.S.C.section 360bbb-3(b)(1), unless the authorization is terminated  or revoked sooner.      . Influenza A by PCR 03/09/2021 NEGATIVE  NEGATIVE Final  . Influenza B by PCR 03/09/2021 NEGATIVE  NEGATIVE Final   Comment: (NOTE) The Xpert Xpress SARS-CoV-2/FLU/RSV plus assay is intended as an aid in the diagnosis of influenza from Nasopharyngeal swab specimens and should not be used as a sole basis for treatment. Nasal washings and aspirates are unacceptable for Xpert Xpress SARS-CoV-2/FLU/RSV testing.  Fact Sheet for Patients: BloggerCourse.com  Fact Sheet for Healthcare Providers: SeriousBroker.it  This test is not yet approved or cleared by the [place] FDA and has been authorized for detection and/or diagnosis of SARS-CoV-2 by FDA under an Emergency Use Authorization (EUA). This EUA  will remain in effect (  meaning this test can be used) for the duration of the COVID-19 declaration under Section 564(b)(1) of the Act, 21 U.S.C. section 360bbb-3(b)(1), unless the authorization is terminated or revoked.  Performed at [facility], [address] [place], [place] [address]   . WBC 03/09/2021 9.8  4.0 - 10.5 K/uL Final  . RBC 03/09/2021 4.37  3.87 - 5.11 MIL/uL Final  . Hemoglobin 03/09/2021 12.0  12.0 - 15.0 g/dL Final  . HCT 66/44/0347 38.3  36.0 - 46.0 % Final  . MCV 03/09/2021 87.6  80.0 - 100.0 fL Final  . MCH 03/09/2021 27.5  26.0 - 34.0 pg Final  . MCHC 03/09/2021 31.3  30.0 - 36.0 g/dL Final  . RDW 42/59/5638 14.1  11.5 - 15.5 % Final  . Platelets 03/09/2021 336  150 - 400 K/uL Final  . nRBC 03/09/2021 0.0  0.0 - 0.2 % Final  . Neutrophils Relative % 03/09/2021 70  % Final  . Neutro Abs 03/09/2021 6.8  1.7 - 7.7 K/uL Final  . Lymphocytes Relative 03/09/2021 23  % Final  . Lymphs Abs 03/09/2021 2.2  0.7 - 4.0 K/uL Final  . Monocytes Relative 03/09/2021 6  % Final  . Monocytes Absolute 03/09/2021 0.6  0.1 - 1.0 K/uL Final  . Eosinophils Relative 03/09/2021 1  % Final  . Eosinophils Absolute 03/09/2021 0.1  0.0 - 0.5 K/uL Final  . Basophils Relative 03/09/2021 0  % Final  . Basophils Absolute 03/09/2021 0.0  0.0 - 0.1 K/uL Final  . Immature Granulocytes 03/09/2021 0  % Final  . Abs Immature Granulocytes 03/09/2021 0.04  0.00 - 0.07 K/uL Final   Performed at [facility], [address] [place], [place] [address]  . Sodium 03/09/2021 139  135 - 145 mmol/L Final  . Potassium 03/09/2021 3.9  3.5 - 5.1 mmol/L Final  . Chloride 03/09/2021 99  98 - 111 mmol/L Final  . CO2 03/09/2021 30  22 - 32 mmol/L Final  . Glucose, Bld 03/09/2021 94  70 - 99 mg/dL Final   Glucose reference range applies only to samples taken after fasting for at least 8 hours.  . BUN 03/09/2021 13  8 - 23 mg/dL Final  . Creatinine, Ser 03/09/2021 0.73  0.44 - 1.00 mg/dL Final  .  Calcium 32/95/1884 9.9  8.9 - 10.3 mg/dL Final  . Total Protein 03/09/2021 7.7  6.5 - 8.1 g/dL Final  . Albumin 16/60/6301 4.3  3.5 - 5.0 g/dL Final  . AST 60/09/9322 18  15 - 41 U/L Final  . ALT 03/09/2021 21  0 - 44 U/L Final  . Alkaline Phosphatase 03/09/2021 57  38 - 126 U/L Final  . Total Bilirubin 03/09/2021 0.4  0.3 - 1.2 mg/dL Final  . GFR, Estimated 03/09/2021 >60  >60 mL/min Final   Comment: (NOTE) Calculated using the CKD-EPI Creatinine Equation (2021)   . Anion gap 03/09/2021 10  5 - 15 Final   Performed at [facility], [address], [place], [place] [address]  . Hgb A1c MFr Bld 03/09/2021 7.7* 4.8 - 5.6 % Final   Comment: (NOTE) Pre diabetes:          5.7%-6.4%  Diabetes:              >6.4%  Glycemic control for   <7.0% adults with diabetes   . Mean Plasma Glucose 03/09/2021 174.29  mg/dL Final   Performed at [facility], [address], [place], [place] [address]  . TSH 03/09/2021 2.351  0.350 - 4.500 uIU/mL Final   Comment: Performed by a 3rd Generation assay with a functional sensitivity of <=0.01 uIU/mL. Performed at [facility], [address], [place], [place] [address]   . POC Amphetamine UR 03/09/2021 None Detected  NONE DETECTED (Cut Off Level 1000 ng/mL) Final  . POC Secobarbital (BAR) 03/09/2021 None Detected  NONE DETECTED (Cut Off Level 300 ng/mL) Final  . POC Buprenorphine (BUP) 03/09/2021 None Detected  NONE DETECTED (Cut Off Level 10 ng/mL) Final  . POC Oxazepam (BZO) 03/09/2021 None Detected  NONE DETECTED (Cut Off Level 300 ng/mL) Final  . POC Cocaine UR 03/09/2021 None Detected  NONE DETECTED (Cut Off Level 300 ng/mL) Final  . POC Methamphetamine UR 03/09/2021 None Detected  NONE DETECTED (Cut Off Level 1000 ng/mL) Final  . POC Morphine 03/09/2021 None Detected  NONE DETECTED (Cut Off Level 300 ng/mL) Final  . POC Oxycodone UR 03/09/2021 None Detected  NONE DETECTED (Cut Off Level 100 ng/mL) Final  . POC Methadone UR  03/09/2021 None Detected  NONE DETECTED (Cut Off Level 300 ng/mL) Final  . POC Marijuana UR 03/09/2021 None Detected  NONE DETECTED (Cut Off Level 50 ng/mL) Final  . SARS Coronavirus 2 Ag 03/09/2021 Negative  Negative Final  . SARS Coronavirus 2 Ag 03/09/2021 NEGATIVE  NEGATIVE Final   Comment: (NOTE) SARS-CoV-2 antigen NOT DETECTED.   Negative results are presumptive.  Negative results do not preclude SARS-CoV-2 infection and should not be used as the sole basis for treatment or other patient management decisions, including infection  control decisions, particularly in the presence of clinical signs and  symptoms consistent with COVID-19, or in those who have been in contact with the virus.  Negative results must be combined with clinical observations, patient history, and epidemiological information. The expected result is Negative.  Fact Sheet for Patients: https://www.jennings-kim.com/  Fact Sheet for Healthcare Providers: https://alexander-rogers.biz/  This test is not yet approved or cleared by the [place] FDA and  has been authorized for detection and/or diagnosis of SARS-CoV-2 by FDA under an Emergency Use Authorization (EUA).  This EUA will remain in effect (meaning this test can be used) for the duration of  the COV                          ID-19 declaration under Section 564(b)(1) of the Act, 21 U.S.C. section 360bbb-3(b)(1), unless the authorization is terminated or revoked sooner.    . Glucose-Capillary 03/09/2021 97  70 - 99 mg/dL Final   Glucose reference range applies only to samples taken after fasting for at least 8 hours.  . Cholesterol 03/09/2021 251* 0 - 200 mg/dL Final  . Triglycerides 03/09/2021 319* <150 mg/dL Final  . HDL 09/81/1914 45  >40 mg/dL Final  . Total CHOL/HDL Ratio 03/09/2021 5.6  RATIO Final  . VLDL 03/09/2021 64* 0 - 40 mg/dL Final  . LDL Cholesterol 03/09/2021 142* 0 - 99 mg/dL Final   Comment:        Total  Cholesterol/HDL:CHD Risk Coronary Heart Disease Risk Table                     Men   Women  1/2 Average Risk   3.4   3.3  Average Risk       5.0   4.4  2 X Average Risk   9.6   7.1  3 X Average Risk  23.4   11.0        Use the calculated Patient Ratio above and the CHD Risk Table to determine the patient's CHD Risk.        ATP III CLASSIFICATION (LDL):  <100     mg/dL   Optimal  161-096  mg/dL   Near or Above                    Optimal  130-159  mg/dL   Borderline  045-409  mg/dL   High  >811     mg/dL   Very High Performed at [facility], [address] [place], [place] [address]   . Glucose-Capillary 03/09/2021 163* 70 - 99 mg/dL Final   Glucose reference range applies only to samples taken after fasting for at least 8 hours.  . Glucose-Capillary 03/09/2021 148* 70 - 99 mg/dL Final   Glucose reference range applies only to samples taken after fasting for at least 8 hours.  . Glucose-Capillary 03/09/2021 90  70 - 99 mg/dL Final   Glucose reference range applies only to samples taken after fasting for at least 8 hours.  . Glucose-Capillary 03/10/2021 54* 70 - 99 mg/dL Final   Glucose reference range applies only to samples taken after fasting for at least 8 hours.  . Glucose-Capillary 03/10/2021 82  70 - 99 mg/dL Final   Glucose reference range applies only to samples taken after fasting for at least 8 hours.  Admission on 02/23/2021, Discharged on 02/24/2021  Component Date Value Ref Range Status  . SARS Coronavirus 2 by RT PCR 02/23/2021 NEGATIVE  NEGATIVE Final   Comment: (NOTE) SARS-CoV-2 target nucleic acids are NOT DETECTED.  The SARS-CoV-2 RNA is generally detectable in upper respiratory specimens during the acute phase of infection. The lowest concentration of SARS-CoV-2 viral copies this assay can detect is 138 copies/mL. A negative result does not preclude SARS-Cov-2 infection and should not be used as the sole basis for treatment or other patient  management decisions. A negative result may occur with  improper specimen collection/handling, submission of specimen other than nasopharyngeal swab, presence of viral mutation(s) within the areas targeted by this assay, and inadequate number of viral copies(<138 copies/mL). A negative result must be combined with clinical observations, patient history, and epidemiological information. The expected result is Negative.  Fact Sheet for Patients:  BloggerCourse.com  Fact Sheet for Healthcare Providers:  SeriousBroker.it  This test is no                          t yet approved or cleared by the [place] FDA and  has been authorized for detection and/or diagnosis of SARS-CoV-2 by FDA under an Emergency Use Authorization (EUA). This EUA will remain  in effect (meaning this test can be used) for the duration of the COVID-19 declaration under Section 564(b)(1) of the Act, 21 U.S.C.section 360bbb-3(b)(1), unless the authorization is terminated  or revoked sooner.      . Influenza A by PCR 02/23/2021 NEGATIVE  NEGATIVE Final  . Influenza B by PCR 02/23/2021 NEGATIVE  NEGATIVE Final   Comment: (NOTE) The Xpert Xpress SARS-CoV-2/FLU/RSV plus assay is intended as an aid in the diagnosis of influenza from Nasopharyngeal swab specimens and should not be used as a sole basis for treatment. Nasal washings and aspirates are unacceptable for Xpert Xpress SARS-CoV-2/FLU/RSV testing.  Fact Sheet for Patients: BloggerCourse.com  Fact Sheet  for Healthcare Providers: SeriousBroker.ithttps://www.fda.gov/media/152162/download  This test is not yet approved or cleared by the [place] FDA and has been authorized for detection and/or diagnosis of SARS-CoV-2 by FDA under an Emergency Use Authorization (EUA). This EUA will remain in effect (meaning this test can be used) for the duration of the COVID-19 declaration under Section  564(b)(1) of the Act, 21 U.S.C. section 360bbb-3(b)(1), unless the authorization is terminated or revoked.  Performed at [facility], [address] [place], [place] [address]   . WBC 02/23/2021 9.7  4.0 - 10.5 K/uL Final  . RBC 02/23/2021 4.16  3.87 - 5.11 MIL/uL Final  . Hemoglobin 02/23/2021 11.3* 12.0 - 15.0 g/dL Final  . HCT 60/45/409802/27/2022 36.6  36.0 - 46.0 % Final  . MCV 02/23/2021 88.0  80.0 - 100.0 fL Final  . MCH 02/23/2021 27.2  26.0 - 34.0 pg Final  . MCHC 02/23/2021 30.9  30.0 - 36.0 g/dL Final  . RDW 11/91/478202/27/2022 13.8  11.5 - 15.5 % Final  . Platelets 02/23/2021 321  150 - 400 K/uL Final  . nRBC 02/23/2021 0.0  0.0 - 0.2 % Final  . Neutrophils Relative % 02/23/2021 73  % Final  . Neutro Abs 02/23/2021 7.1  1.7 - 7.7 K/uL Final  . Lymphocytes Relative 02/23/2021 20  % Final  . Lymphs Abs 02/23/2021 1.9  0.7 - 4.0 K/uL Final  . Monocytes Relative 02/23/2021 5  % Final  . Monocytes Absolute 02/23/2021 0.5  0.1 - 1.0 K/uL Final  . Eosinophils Relative 02/23/2021 2  % Final  . Eosinophils Absolute 02/23/2021 0.2  0.0 - 0.5 K/uL Final  . Basophils Relative 02/23/2021 0  % Final  . Basophils Absolute 02/23/2021 0.0  0.0 - 0.1 K/uL Final  . Immature Granulocytes 02/23/2021 0  % Final  . Abs Immature Granulocytes 02/23/2021 0.04  0.00 - 0.07 K/uL Final   Performed at [facility], [address] [place], [place] [address]  . Sodium 02/23/2021 139  135 - 145 mmol/L Final  . Potassium 02/23/2021 4.3  3.5 - 5.1 mmol/L Final  . Chloride 02/23/2021 99  98 - 111 mmol/L Final  . CO2 02/23/2021 26  22 - 32 mmol/L Final  . Glucose, Bld 02/23/2021 132* 70 - 99 mg/dL Final   Glucose reference range applies only to samples taken after fasting for at least 8 hours.  . BUN 02/23/2021 10  8 - 23 mg/dL Final  . Creatinine, Ser 02/23/2021 0.67  0.44 - 1.00 mg/dL Final  . Calcium 30/86/578402/27/2022 9.7  8.9 - 10.3 mg/dL Final  . Total Protein 02/23/2021 7.7  6.5 - 8.1 g/dL Final  . Albumin  69/62/952802/27/2022 4.2  3.5 - 5.0 g/dL Final  . AST 41/32/440102/27/2022 17  15 - 41 U/L Final  . ALT 02/23/2021 21  0 - 44 U/L Final  . Alkaline Phosphatase 02/23/2021 53  38 - 126 U/L Final  . Total Bilirubin 02/23/2021 0.5  0.3 - 1.2 mg/dL Final  . GFR, Estimated 02/23/2021 >60  >60 mL/min Final   Comment: (NOTE) Calculated using the CKD-EPI Creatinine Equation (2021)   . Anion gap 02/23/2021 14  5 - 15 Final   Performed at [facility], [address] [place], [place] [address]  . TSH 02/23/2021 2.422  0.350 - 4.500 uIU/mL Final   Comment: Performed by a 3rd Generation assay with a functional sensitivity of <=0.01 uIU/mL. Performed at [facility], [address] [place], [place] [address]   . Hgb A1c MFr Bld 02/23/2021 7.9* 4.8 - 5.6 % Final   Comment: (NOTE) Pre diabetes:          5.7%-6.4%  Diabetes:              >6.4%  Glycemic control for   <7.0% adults with diabetes   . Mean Plasma Glucose 02/23/2021 180.03  mg/dL Final   Performed at [facility], [address], [place], [place] [address]  . Prolactin 02/23/2021 39.9* 4.8 - 23.3 ng/mL Final   Comment: (NOTE) Performed At: [facility] [address] [place], [place] [id number] Jolene Schimke MD WG:[id number]   . POC Amphetamine UR 02/23/2021 None Detected  NONE DETECTED (Cut Off Level 1000 ng/mL) Final  . POC Secobarbital (BAR) 02/23/2021 None Detected  NONE DETECTED (Cut Off Level 300 ng/mL) Final  . POC Buprenorphine (BUP) 02/23/2021 None Detected  NONE DETECTED (Cut Off Level 10 ng/mL) Final  . POC Oxazepam (BZO) 02/23/2021 None Detected  NONE DETECTED (Cut Off Level 300 ng/mL) Final  . POC Cocaine UR 02/23/2021 None Detected  NONE DETECTED (Cut Off Level 300 ng/mL) Final  . POC Methamphetamine UR 02/23/2021 None Detected  NONE DETECTED (Cut Off Level 1000 ng/mL) Final  . POC Morphine 02/23/2021 None Detected  NONE DETECTED (Cut Off Level 300 ng/mL) Final  . POC Oxycodone UR 02/23/2021 None Detected   NONE DETECTED (Cut Off Level 100 ng/mL) Final  . POC Methadone UR 02/23/2021 None Detected  NONE DETECTED (Cut Off Level 300 ng/mL) Final  . POC Marijuana UR 02/23/2021 None Detected  NONE DETECTED (Cut Off Level 50 ng/mL) Final  . SARS Coronavirus 2 Ag 02/23/2021 Negative  Negative Final  . SARS Coronavirus 2 Ag 02/23/2021 NEGATIVE  NEGATIVE Final   Comment: (NOTE) SARS-CoV-2 antigen NOT DETECTED.   Negative results are presumptive.  Negative results do not preclude SARS-CoV-2 infection and should not be used as the sole basis for treatment or other patient management decisions, including infection  control decisions, particularly in the presence of clinical signs and  symptoms consistent with COVID-19, or in those who have been in contact with the virus.  Negative results must be combined with clinical observations, patient history, and epidemiological information. The expected result is Negative.  Fact Sheet for Patients: https://www.jennings-kim.com/  Fact Sheet for Healthcare Providers: https://alexander-rogers.biz/  This test is not yet approved or cleared by the [place] FDA and  has been authorized for detection and/or diagnosis of SARS-CoV-2 by FDA under an Emergency Use Authorization (EUA).  This EUA will remain in effect (meaning this test can be used) for the duration of  the COV                          ID-19 declaration under Section 564(b)(1) of the Act, 21 U.S.C. section 360bbb-3(b)(1), unless the authorization is terminated or revoked sooner.    . Glucose-Capillary 02/23/2021 146* 70 - 99 mg/dL Final   Glucose reference range applies only to samples taken after fasting for at least 8 hours.  . Cholesterol 02/23/2021 217* 0 - 200 mg/dL Final  . Triglycerides 02/23/2021 301* <150 mg/dL Final  . HDL 78/46/9629 40* >40 mg/dL Final  . Total CHOL/HDL Ratio 02/23/2021 5.4  RATIO Final  . VLDL 02/23/2021 60* 0 - 40 mg/dL Final  . LDL  Cholesterol 02/23/2021 117* 0 - 99 mg/dL Final   Comment:  Total Cholesterol/HDL:CHD Risk Coronary Heart Disease Risk Table                     Men   Women  1/2 Average Risk   3.4   3.3  Average Risk       5.0   4.4  2 X Average Risk   9.6   7.1  3 X Average Risk  23.4   11.0        Use the calculated Patient Ratio above and the CHD Risk Table to determine the patient's CHD Risk.        ATP III CLASSIFICATION (LDL):  <100     mg/dL   Optimal  672-094  mg/dL   Near or Above                    Optimal  130-159  mg/dL   Borderline  709-628  mg/dL   High  >366     mg/dL   Very High Performed at [facility], [address] [place], [place] [address]   . Glucose-Capillary 02/23/2021 136* 70 - 99 mg/dL Final   Glucose reference range applies only to samples taken after fasting for at least 8 hours.  . Glucose-Capillary 02/23/2021 132* 70 - 99 mg/dL Final   Glucose reference range applies only to samples taken after fasting for at least 8 hours.  . Glucose-Capillary 02/24/2021 170* 70 - 99 mg/dL Final   Glucose reference range applies only to samples taken after fasting for at least 8 hours.    Blood Alcohol level:  Lab Results  Component Value Date   [facility] <10 02/26/2020   ETH <11 07/06/2013    Metabolic Disorder Labs: Lab Results  Component Value Date   HGBA1C 7.7 (H) 03/09/2021   MPG 174.29 03/09/2021   MPG 180.03 02/23/2021   Lab Results  Component Value Date   PROLACTIN 39.9 (H) 02/23/2021   Lab Results  Component Value Date   CHOL 251 (H) 03/09/2021   TRIG 319 (H) 03/09/2021   HDL 45 03/09/2021   CHOLHDL 5.6 03/09/2021   VLDL 64 (H) 03/09/2021   LDLCALC 142 (H) 03/09/2021   LDLCALC 117 (H) 02/23/2021    Therapeutic Lab Levels: No results found for: LITHIUM No results found for: VALPROATE No components found for:  CBMZ  Physical Findings   AUDIT   Flowsheet Row [facility]  Alcohol Use Disorder Identification Test Final Score (AUDIT) 0    PHQ2-9   Flowsheet Row Nutrition [facility]/21/2019 [facility]  PHQ-2 Total Score 0    Flowsheet Row ED from 03/09/2021 in [facility] ED from 02/23/2021 in [facility] ED from 02/26/2020 in [facility]  C-SSRS RISK CATEGORY Low Risk High Risk High Risk       Musculoskeletal  Strength & Muscle Tone: within normal limits Gait & Station: broad based Patient leans: N/A  Psychiatric Specialty Exam  Presentation  General Appearance: Appropriate for Environment  Eye Contact:Good  Speech:Slow (slow to respond)  Speech Volume:Decreased  Handedness:Right   Mood and Affect  Mood:Anxious; Depressed; Hopeless  Affect:Depressed   Thought Process  Thought Processes:Coherent  Descriptions of Associations:Intact  Orientation:Full (Time, Place and Person)  Thought Content:Rumination; Logical  Hallucinations:Hallucinations: None  Ideas of Reference:None  Suicidal Thoughts:Suicidal Thoughts: Yes, Active SI Active Intent and/or Plan: With  Intent; With Plan; With Means to Carry Out; With Access to Means  Homicidal Thoughts:Homicidal Thoughts: No   Sensorium  Memory:Immediate Poor; Recent Poor; Remote Fair  Judgment:Impaired  Insight:Fair   Executive Functions  Concentration:Poor  Attention Span:Fair  Recall:Poor  Fund of Knowledge:Fair  Language:Fair   Psychomotor Activity  Psychomotor Activity:Psychomotor Activity: Decreased; Psychomotor Retardation; Wide Based Gait   Assets  Assets:Communication Skills; Desire for Improvement; Housing; Social Support; Transportation   Sleep  Sleep:Sleep: Poor   Nutritional Assessment (For OBS and [facility] admissions only) Has the patient had a weight loss or gain of 10 pounds or more in the last 3 months?: Yes Has the patient  had a decrease in food intake/or appetite?: No Does the patient have dental problems?: No Does the patient have eating habits or behaviors that may be indicators of an eating disorder including binging or inducing vomiting?: No Has the patient recently lost weight without trying?: Yes, 2-13 lbs. Has the patient been eating poorly because of a decreased appetite?: No Malnutrition Screening Tool Score: 1    Physical Exam  Physical Exam Vitals and nursing note reviewed.  Constitutional:      Appearance: She is well-developed.  HENT:     Head: Normocephalic.  Cardiovascular:     Rate and Rhythm: Normal rate.  Pulmonary:     Effort: Pulmonary effort is normal.  Neurological:     Mental Status: She is alert and oriented to person, place, and time.     Coordination: Coordination abnormal. Heel to Shin Test normal. Impaired rapid alternating movements.     Gait: Gait abnormal.     Comments: Wide-based gait  Psychiatric:        Attention and Perception: Attention normal.        Mood and Affect: Mood is anxious and depressed.        Speech: Speech is delayed.        Cognition and Memory: Cognition is impaired. Memory is impaired. She exhibits impaired recent memory.    Review of Systems  Constitutional: Negative.   HENT: Negative.   Eyes: Negative.   Respiratory: Negative.   Cardiovascular: Negative.   Gastrointestinal: Negative.   Genitourinary: Negative.   Musculoskeletal: Negative.   Skin: Negative.   Neurological: Negative.   Endo/Heme/Allergies: Negative.   Psychiatric/Behavioral: Positive for depression and suicidal ideas. Negative for hallucinations, memory loss and substance abuse. The patient is nervous/anxious and has insomnia.    Blood pressure 131/78, pulse 82, temperature 98.9 F (37.2 C), temperature source Oral, resp. rate 20, SpO2 97 %. There is no height or weight on file to calculate BMI.   Treatment Plan Summary: Daily contact with patient to assess and  evaluate symptoms and progress in treatment and seek inpatient mood disorder bed for depression, anxiety, and active suicidal ideation.  Patrcia Dolly, FNP 03/10/2021 11:16 AM

## 2021-03-10 NOTE — Progress Notes (Signed)
Inpatient Diabetes Program Recommendations  AACE/ADA: New Consensus Statement on Inpatient Glycemic Control (2015)  Target Ranges:  Prepandial:   less than 140 mg/dL      Peak postprandial:   less than 180 mg/dL (1-2 hours)      Critically ill patients:  140 - 180 mg/dL   Lab Results  Component Value Date   GLUCAP 202 (H) 03/10/2021   HGBA1C 7.7 (H) 03/09/2021    Review of Glycemic Control  Diabetes history: DM2 Outpatient Diabetes medications: Levemir 30 units BID, metformin 1000 mg BID Current orders for Inpatient glycemic control: Levemir 30 BID, metformin 1000 mg BID  HgbA1C - 7.7% Hypoglycemia this am of 54 mg/dL.  Inpatient Diabetes Program Recommendations:    Decrease Levemir to 20 units BID Add Novolog 0-9 units TID with meals   Follow while inpatient.  Thank you. Ailene Ards, RD, LDN, CDE Inpatient Diabetes Coordinator [phone]

## 2021-03-10 NOTE — Discharge Instructions (Addendum)
Transfer to [facility] °

## 2021-03-10 NOTE — Progress Notes (Signed)
Patient information has been sent to [facility] via secure chat to review for potential admission. Patient meets inpatient criteria per Karsten Ro, MD.   Situation ongoing, CSW will continue to monitor progress.    Signed:  Corky Crafts, MSW, [place], LCASA 03/10/2021 9:47 AM

## 2021-03-11 DIAGNOSIS — F332 Major depressive disorder, recurrent severe without psychotic features: Secondary | ICD-10-CM | POA: Diagnosis not present

## 2021-03-11 DIAGNOSIS — Z9151 Personal history of suicidal behavior: Secondary | ICD-10-CM | POA: Diagnosis not present

## 2021-03-11 DIAGNOSIS — Z20822 Contact with and (suspected) exposure to covid-19: Secondary | ICD-10-CM | POA: Diagnosis not present

## 2021-03-11 DIAGNOSIS — F322 Major depressive disorder, single episode, severe without psychotic features: Secondary | ICD-10-CM

## 2021-03-11 DIAGNOSIS — Z79899 Other long term (current) drug therapy: Secondary | ICD-10-CM | POA: Diagnosis not present

## 2021-03-11 LAB — GLUCOSE, CAPILLARY
Glucose-Capillary: 64 mg/dL — ABNORMAL LOW (ref 70–99)
Glucose-Capillary: 110 mg/dL — ABNORMAL HIGH (ref 70–99)

## 2021-03-11 MED ORDER — BUSPIRONE HCL 10 MG PO TABS: 10.0000 mg | ORAL_TABLET | Freq: Three times a day (TID) | ORAL | Status: AC

## 2021-03-11 NOTE — Discharge Summary (Signed)
Jaclyn [name] to be D/C'd [name] per MD order. Discussed with the patient and all questions fully answered. An After Visit Summary, EMTALA and Med Necessity form were printed and given to the patient. Patient escorted out and transported by safe transport. [name]  03/11/2021 11:52 AM

## 2021-03-11 NOTE — Progress Notes (Addendum)
Hypoglycemic Event  CBG: 64  Treatment: Two 4oz juice  Symptoms: Asymptomatic  Follow-up CBG: Time: 0826 CBG Result:110  Possible Reasons for Event: Poor food intake  Comments/MD notified:Dr. Stark Bray, RN

## 2021-03-11 NOTE — Progress Notes (Signed)
Report was given to Jeni Salles, RN at [facility]. [facility] called.

## 2021-03-11 NOTE — ED Notes (Signed)
Pt asleep in bed. Respirations even and unlabored. Will continue to monitor for safety. ?

## 2021-03-11 NOTE — ED Provider Notes (Signed)
FBC/OBS ASAP Discharge Summary  Date and Time: 03/11/2021 5:07 PM  Name: Jaclyn [name]  MRN:  [id number]   Discharge Diagnoses:  Final diagnoses:  Current severe episode of major depressive disorder without psychotic features without prior episode [facility])    Subjective:  Patient interviewed this AM. She reports poor sleep and appetite. She reports ongoing depression. When asked about SI she states  "I don't know". She is unable to contract for safety. She denies HI/AVH. She is informed that [facility] has [facility], verbalized understanding. She requests that I call daughter, Raoul Pitch, "because she lives in [place]".  Raoul Pitch (daughter) [phone] Called and informed daughter of acceptance; daughter agreeable to admission to [facility].  Stay Summary:  Pt is 65 year old female with past history significant for MDD, Anxiety, Bipolar disorder presented to [facility] walk in voluntarily with her daughter on 03/09/21 for having thoughts of hurting herself with a plan of overdosing and worsening depression. Patient is seen and examined today.  Daughter is in the room with patient.  Pt daughter reports that she saw her trying to overdose on some pills but she threw the bottle away and drove her here.  Daughter does not remember which medication she was trying to overdose. Pt and daughter states any change in the environment worsen her depression. Daughter states that she talked to her about moving to a different place that triggered Pt's suicidal ideation and worsened her depression.  Patient states " I am done and tired of dealing with my depression and anxiety".  She states she has been feeling depressed all her life. Daughter reports multiple past suicidal attempts between 2013-2016 by overdosing, running away.  Patient endorses active suicidal ideation with a plan of overdosing on some medication and intent of following through. She denies self-cutting behaviors. She has been complaint with her  medication. She has been taking BuSpar, risperidone, sertraline. She sees Dr. Teresita Madura and her last appointment was on last Friday. She states the doctor didn't change any meds.  Currently,she denies any homicidal ideation, and visual and auditory hallucination. She denies any paranoia.  Patient was admitted for observation and continued on home medication. Patient was medication compliant throughout stay at the [facility] There were no available beds at [facility] and so her information was faxed out to other facilities; she was accepted to [facility] on day of discharged, 03/11/21-she was unable to contract for safety at this time and remained appropriate for inpatient hospitalization. See above for more details.   Total Time spent with patient: 20 minutes  Past Psychiatric History: see H7P Past Medical History:  Past Medical History:  Diagnosis Date  . Anxiety   . Arthritis   . Depression   . Diabetes mellitus without complication (HCC)   . Hypertension     Past Surgical History:  Procedure Laterality Date  . NASAL SEPTUM SURGERY    . TUBAL LIGATION     Family History:  Family History  Problem Relation Age of Onset  . Huntington's disease Father    Family Psychiatric History: see H&P Social History:  Social History   Substance and Sexual Activity  Alcohol Use No     Social History   Substance and Sexual Activity  Drug Use No    Social History   Socioeconomic History  . Marital status: Single    Spouse name: Not on file  . Number of children: Not on file  . Years of education: Not on file  .  Highest education level: Not on file  Occupational History  . Not on file  Tobacco Use  . Smoking status: Never Smoker  . Smokeless tobacco: Never Used  Vaping Use  . Vaping Use: Never used  Substance and Sexual Activity  . Alcohol use: No  . Drug use: No  . Sexual activity: Not Currently  Other Topics Concern  . Not on file  Social History Narrative   Lives with daughter    Social Determinants of Health   Financial Resource Strain: Not on file  Food Insecurity: Not on file  Transportation Needs: Not on file  Physical Activity: Not on file  Stress: Not on file  Social Connections: Not on file   SDOH:  SDOH Screenings   Alcohol Screen: Not on file  Depression (UUV2-5): Not on file  Financial Resource Strain: Not on file  Food Insecurity: Not on file  Housing: Not on file  Physical Activity: Not on file  Social Connections: Not on file  Stress: Not on file  Tobacco Use: Low Risk   . Smoking Tobacco Use: Never Smoker  . Smokeless Tobacco Use: Never Used  Transportation Needs: Not on file    Has this patient used any form of tobacco in the last 30 days? (Cigarettes, Smokeless Tobacco, Cigars, and/or Pipes) Prescription not provided because: n/a  Current Medications:  No current facility-administered medications for this encounter.   Current Outpatient Medications  Medication Sig Dispense Refill  . busPIRone (BUSPAR) 10 MG tablet Take 1 tablet (10 mg total) by mouth 3 (three) times daily.    Marland Kitchen glucose blood test strip Use to check blood sugar twice daily (Patient taking differently: 1 each by Other route as needed (blood sugar). One touch verio--Use to check blood sugar twice daily) 100 each 5  . Insulin Detemir (LEVEMIR FLEXTOUCH) 100 UNIT/ML Pen Inject 28 Units into the skin daily at 10 pm. (Patient taking differently: Inject 30 Units into the skin 2 (two) times daily.) 9 mL 0  . Insulin Pen Needle (BD PEN NEEDLE NANO U/F) 32G X 4 MM MISC Test daily. 100 each 5  . metFORMIN (GLUCOPHAGE) 1000 MG tablet TAKE ONE TABLET BY MOUTH TWICE DAILY WITH  A  MEAL (Patient taking differently: Take 1,000 mg by mouth 2 (two) times daily with a meal.) 180 tablet 3  . Multiple Vitamin (MULTIVITAMIN WITH MINERALS) TABS tablet Take 1 tablet by mouth daily.    . Olmesartan-amLODIPine-HCTZ (TRIBENZOR) 40-10-25 MG TABS One tablet by mouth daily 90 tablet 3  .  omeprazole (PRILOSEC) 40 MG capsule Take 40 mg by mouth daily.    . risperiDONE (RISPERDAL) 0.5 MG tablet TAKE ONE TABLET BY MOUTH TWICE DAILY FOR MOOD  CONTROL. (Patient taking differently: Take 0.5-1 mg by mouth See admin instructions. 0.5mg  in AM and 1mg  in evening) 180 tablet 0  . sertraline (ZOLOFT) 100 MG tablet Take 200 mg by mouth daily.       PTA Medications: (Not in a hospital admission)   Musculoskeletal  Strength & Muscle Tone: within normal limits Gait & Station: normal Patient leans: N/A  Psychiatric Specialty Exam  Presentation  General Appearance: Appropriate for Environment; Casual  Eye Contact:Good  Speech:Slow; Other (comment) (increased latency of response)  Speech Volume:Decreased  Handedness:Right   Mood and Affect  Mood:Anxious; Depressed; Hopeless  Affect:Appropriate; Congruent; Depressed   Thought Process  Thought Processes:Coherent; Linear; Goal Directed  Descriptions of Associations:Intact  Orientation:Full (Time, Place and Person)  Thought Content:WDL  Hallucinations:Hallucinations: None  Ideas  of Reference:None  Suicidal Thoughts:Suicidal Thoughts: Yes, Passive (stated "I don't know"; cant contract for safety)   Homicidal Thoughts:Homicidal Thoughts: No   Sensorium  Memory:Immediate Fair; Recent Poor; Remote Poor  Judgment:Impaired  Insight:Fair   Executive Functions  Concentration:Poor  Attention Span:Fair  Recall:Poor  Fund of Knowledge:Fair  Language:Fair   Psychomotor Activity  Psychomotor Activity:Psychomotor Activity: Psychomotor Retardation; Decreased   Assets  Assets:Communication Skills; Desire for Improvement; Social Support; Housing   Sleep  Sleep:Sleep: Poor   No data recorded  Physical Exam  Physical Exam Constitutional:      Appearance: Normal appearance.  HENT:     Head: Normocephalic and atraumatic.  Pulmonary:     Effort: Pulmonary effort is normal.  Neurological:     Mental  Status: She is alert.    Review of Systems  Constitutional: Negative for chills and fever.  Respiratory: Negative for cough.   Cardiovascular: Negative for chest pain.  Musculoskeletal: Negative for myalgias.  Neurological: Negative for dizziness.  Psychiatric/Behavioral: Positive for depression and suicidal ideas. Negative for hallucinations.   Blood pressure 120/84, pulse 82, temperature 97.8 F (36.6 C), resp. rate 18, SpO2 100 %. There is no height or weight on file to calculate BMI.  Demographic Factors:  Caucasian  Loss Factors: NA  Historical Factors: Prior suicide attempts  Risk Reduction Factors:   Positive social support  Continued Clinical Symptoms:  Depression:   Hopelessness Insomnia Previous Psychiatric Diagnoses and Treatments  Cognitive Features That Contribute To Risk:  Thought constriction (tunnel vision)    Suicide Risk:  Moderate:  Frequent suicidal ideation with limited intensity, and duration, some specificity in terms of plans, no associated intent, good self-control, limited dysphoria/symptomatology, some risk factors present, and identifiable protective factors, including available and accessible social support.  Plan Of Care/Follow-up recommendations:  [facility]  Disposition: [facility]  Estella Husk, MD 03/11/2021, 5:07 PM

## 2021-03-11 NOTE — Progress Notes (Signed)
Pt is sitting quietly. Pt is alert and oriented. Pt endorses SI with no plan. Pt verbally contracts for safety. Medications administered with no incident. Pt denies pain, HI and AVH at this time. Staff will monitor for safety.

## 2021-03-11 NOTE — ED Notes (Signed)
Patient refused lunch wanted nutrigrain bar instead

## 2021-10-20 ENCOUNTER — Ambulatory Visit: Payer: Medicare Other

## 2021-10-20 ENCOUNTER — Ambulatory Visit
Admission: EM | Admit: 2021-10-20 | Discharge: 2021-10-20 | Disposition: A | Discharge status: Home / Self Care | Payer: Medicare Other | Attending: Emergency Medicine | Admitting: Emergency Medicine

## 2021-10-20 ENCOUNTER — Other Ambulatory Visit: Payer: Self-pay

## 2021-10-20 DIAGNOSIS — J069 Acute upper respiratory infection, unspecified: Secondary | ICD-10-CM

## 2021-10-20 NOTE — ED Notes (Signed)
Triaged by provider  

## 2021-10-20 NOTE — ED Provider Notes (Signed)
[facility]    CSN: [id number] Arrival date & time: 10/20/21  1511      History   Chief Complaint No chief complaint on file.   HPI Jaclyn [name] is a 65 y.o. female.   Patient complains of a 2-week history of cough and runny nose.  Patient states she has had some mild nausea but no vomiting.  Patient reports an episode of diarrhea yesterday.  Patient denies fever, aches, chills, vomiting, headache, sore throat, loss of taste or smell, sinus pressure, body ache.  Patient denies sick contacts.  Patient states she been using over-the-counter cold preparations with some symptomatic relief.  The history is provided by the patient.   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      Home Medications    Prior to Admission medications   Medication Sig Start Date End Date Taking? Authorizing Provider  busPIRone (BUSPAR) 10 MG tablet Take 1 tablet (10 mg total) by mouth 3 (three) times daily. 03/11/21   Estella Husk, MD  glucose blood test strip Use to check blood sugar twice daily Patient taking differently: 1 each by Other route as needed (blood sugar). One touch verio--Use to check blood sugar twice daily 02/27/15   Worthy Rancher B, FNP  Insulin Detemir (LEVEMIR FLEXTOUCH) 100 UNIT/ML Pen Inject 28  Units into the skin daily at 10 pm. Patient taking differently: Inject 30 Units into the skin 2 (two) times daily. 07/04/15   Terressa Koyanagi, DO  Insulin Pen Needle (BD PEN NEEDLE NANO U/F) 32G X 4 MM MISC Test daily. 09/11/14   Toniann Ket, PA-C  metFORMIN (GLUCOPHAGE) 1000 MG tablet TAKE ONE TABLET BY MOUTH TWICE DAILY WITH  A  MEAL Patient taking differently: Take 1,000 mg by mouth 2 (two) times daily with a meal. 04/06/15   Terressa Koyanagi, DO  Multiple Vitamin (MULTIVITAMIN WITH MINERALS) TABS tablet Take 1 tablet by mouth daily.    [provider]  Olmesartan-amLODIPine-HCTZ Marya Landry) 40-10-25 MG TABS One tablet by mouth daily 12/05/20   Rollene Rotunda, MD  omeprazole (PRILOSEC) 40 MG capsule Take 40 mg by mouth daily. 01/01/20   [provider]  risperiDONE (RISPERDAL) 0.5 MG tablet TAKE ONE TABLET BY MOUTH TWICE DAILY FOR MOOD  CONTROL. Patient taking differently: Take 0.5-1 mg by mouth See admin instructions. 0.5mg  in AM and 1mg  in evening 05/24/15   05/26/15 B, FNP  sertraline (ZOLOFT) 100 MG tablet Take 200 mg by mouth daily.  05/07/17   [provider]    Family History Family History  Problem Relation Age of Onset   Huntington's disease Father     Social History Social History   Tobacco Use  Smoking status: Never   Smokeless tobacco: Never  Vaping Use   Vaping Use: Never used  Substance Use Topics   Alcohol use: No   Drug use: No     Allergies   Codeine, Flagyl [metronidazole], Metronidazole benzo+syrspend, Morphine and related, and Remeron [mirtazapine]   Review of Systems Review of Systems Pertinent findings noted in history of present illness.    Physical Exam Triage Vital Signs ED Triage Vitals  Enc Vitals Group     BP      Pulse      Resp      Temp      Temp src      SpO2      Weight      Height      Head Circumference      Peak Flow      Pain Score      Pain Loc      Pain Edu?      Excl. in GC?    No data  found.  Updated Vital Signs BP 132/82 (BP Location: Left Arm)   Pulse 91   Temp 99.5 F (37.5 C) (Oral)   Resp 18   SpO2 97%   Visual Acuity Right Eye Distance:   Left Eye Distance:   Bilateral Distance:    Right Eye Near:   Left Eye Near:    Bilateral Near:     Physical Exam Vitals and nursing note reviewed.  Constitutional:      Appearance: Normal appearance.  HENT:     Head: Normocephalic and atraumatic.     Right Ear: Tympanic membrane, ear canal and external ear normal.     Left Ear: Tympanic membrane, ear canal and external ear normal.     Nose: Congestion and rhinorrhea present.     Mouth/Throat:     Mouth: Mucous membranes are moist.     Pharynx: Oropharynx is clear.  Eyes:     Extraocular Movements: Extraocular movements intact.     Conjunctiva/sclera: Conjunctivae normal.     Pupils: Pupils are equal, round, and reactive to light.  Cardiovascular:     Rate and Rhythm: Normal rate and regular rhythm.     Pulses: Normal pulses.     Heart sounds: Normal heart sounds.  Pulmonary:     Effort: Pulmonary effort is normal.     Breath sounds: Normal breath sounds.  Musculoskeletal:     Cervical back: Normal range of motion and neck supple.  Neurological:     General: No focal deficit present.     Mental Status: She is alert and oriented to person, place, and time. Mental status is at baseline.  Psychiatric:        Mood and Affect: Mood normal.        Behavior: Behavior normal.     UC Treatments / Results  Labs (all labs ordered are listed, but only abnormal results are displayed) Labs Reviewed  COVID-19, FLU A+B NAA    EKG   Radiology No results found.  Procedures Procedures (including critical care time)  Medications Ordered in UC Medications - No data to display  Initial Impression / Assessment and Plan / UC Course  I have reviewed the triage vital signs and the nursing notes.  Pertinent labs & imaging results that were available during my  care of the patient were reviewed by me and considered in my medical decision making (see chart for details).     Viral upper respiratory infection.  Conservative care recommended.  Patient advised she will be notified of the results of her COVID and flu testing once they are received that further recommendations will be provided based on those results.  Patient verbalized understanding and agreement of plan as discussed.  All questions were addressed during visit.  Please see discharge instructions below for further details of plan.  Final Clinical Impressions(s) / UC Diagnoses   Final diagnoses:  Viral upper respiratory illness     Discharge Instructions      You have a viral upper respiratory illness. Conservative care is the mainstay of treatment for this.  This includes rest, pushing clear fluids and activity as tolerated.  You may also noticed that your appetite is reduced, this is okay as long as you are drinking plenty of clear fluids.  The results of your COVID and flu tests will be provided to you within the next 12 to 24 hours.  Acetaminophen: This is a good fever reducer.  If your body temperature rises above 101.5 as measured with a thermometer, it is recommended that you take 1000 mg every 8 hours until your temperature falls below 101.5  Ibuprofen: This is a good anti-inflammatory medication which addresses aches and pains and, to some degree, congestion in the nasal passages.  I recommend taking between 200 to 400 mg every 8 hours as needed.  Pseudoephedrine: This is a decongestant.  This medication has to be purchased from the pharmacist counter, I recommend taking 2 tablets, 60 mg, 2-3 times a day as needed to relieve runny nose and sinus drainage.  Guaifenesin: This is an expectorant.  This helps break up chest congestion and loosen up thick nasal drainage making phlegm and drainage more liquid and therefore easier to remove.  I recommend taking 400 mg 3 times daily as  needed.  Dextromethorphan: This is a cough suppressant.  This is often recommended to be taken at nighttime to suppress cough and help people sleep.      ED Prescriptions   None    PDMP not reviewed this encounter.   Theadora Rama Scales, PA-C 10/21/21 1601

## 2021-10-20 NOTE — Discharge Instructions (Addendum)
You have a viral upper respiratory illness. Conservative care is the mainstay of treatment for this.  This includes rest, pushing clear fluids and activity as tolerated.  You may also noticed that your appetite is reduced, this is okay as long as you are drinking plenty of clear fluids.  The results of your COVID and flu tests will be provided to you within the next 12 to 24 hours.  Acetaminophen: This is a good fever reducer.  If your body temperature rises above 101.5 as measured with a thermometer, it is recommended that you take 1000 mg every 8 hours until your temperature falls below 101.5  Ibuprofen: This is a good anti-inflammatory medication which addresses aches and pains and, to some degree, congestion in the nasal passages.  I recommend taking between 200 to 400 mg every 8 hours as needed.  Pseudoephedrine: This is a decongestant.  This medication has to be purchased from the pharmacist counter, I recommend taking 2 tablets, 60 mg, 2-3 times a day as needed to relieve runny nose and sinus drainage.  Guaifenesin: This is an expectorant.  This helps break up chest congestion and loosen up thick nasal drainage making phlegm and drainage more liquid and therefore easier to remove.  I recommend taking 400 mg 3 times daily as needed.  Dextromethorphan: This is a cough suppressant.  This is often recommended to be taken at nighttime to suppress cough and help people sleep.

## 2021-10-21 LAB — COVID-19, FLU A+B NAA
Influenza A, NAA: NOT DETECTED
Influenza B, NAA: NOT DETECTED
SARS-CoV-2, NAA: NOT DETECTED

## 2021-12-07 ENCOUNTER — Other Ambulatory Visit: Payer: Self-pay | Admitting: Cardiology

## 2021-12-07 NOTE — Progress Notes (Signed)
Cardiology Office Note   Date:  12/09/2021   ID:  Charline, Hoskinson 1956-12-18, MRN [id number]  PCP:  Clayborn Heron, MD  Cardiologist:   Rollene Rotunda, MD    Chief Complaint  Patient presents with   Hypertension       History of Present Illness: Jaclyn [name] is a 65 y.o. female who presents for who was referred by Clayborn Heron, MD for evaluation of difficult to control HTN.  I added Norvasc and PRN hydralazine.   Since I last saw her she has done quite well.  We switched her to Tribenzor and this seems to have done the trick.  Her blood pressures are excellent. The patient denies any new symptoms such as chest discomfort, neck or arm discomfort. There has been no new shortness of breath, PND or orthopnea. There have been no reported palpitations, presyncope or syncope.    Past Medical History:  Diagnosis Date   Anxiety    Arthritis    Depression    Diabetes mellitus without complication (HCC)    Hypertension     Past Surgical History:  Procedure Laterality Date   NASAL SEPTUM SURGERY     TUBAL LIGATION       Current Outpatient Medications  Medication Sig Dispense Refill   busPIRone (BUSPAR) 10 MG tablet Take 1 tablet (10 mg total) by mouth 3 (three) times daily.     glucose blood test strip Use to check blood sugar twice daily (Patient taking differently: 1 each by Other route as needed (blood sugar). One touch verio--Use to check blood sugar twice daily) 100 each 5   Insulin Detemir (LEVEMIR FLEXTOUCH) 100 UNIT/ML Pen Inject 28 Units into the skin daily at 10 pm. (Patient taking differently: Inject 30 Units into the skin 2 (two) times daily.) 9 mL 0   Insulin Pen Needle (BD PEN NEEDLE NANO U/F) 32G X 4 MM MISC Test daily. 100 each 5   metFORMIN (GLUCOPHAGE) 1000 MG tablet TAKE ONE TABLET BY MOUTH TWICE DAILY WITH  A  MEAL (Patient taking differently: Take 1,000 mg by mouth 2 (two) times daily with a meal.) 180 tablet 3   Multiple Vitamin (MULTIVITAMIN  WITH MINERALS) TABS tablet Take 1 tablet by mouth daily.     omeprazole (PRILOSEC) 40 MG capsule Take 40 mg by mouth daily.     risperiDONE (RISPERDAL) 0.5 MG tablet TAKE ONE TABLET BY MOUTH TWICE DAILY FOR MOOD  CONTROL. (Patient taking differently: Take 0.5-1 mg by mouth See admin instructions. 0.5mg  in AM and 1mg  in evening) 180 tablet 0   rosuvastatin (CRESTOR) 10 MG tablet 1 tablet     sertraline (ZOLOFT) 100 MG tablet Take 200 mg by mouth daily.      Olmesartan-amLODIPine-HCTZ 40-10-25 MG TABS Take 1 tablet by mouth once daily 90 tablet 3   No current facility-administered medications for this visit.    Allergies:   Codeine, Flagyl [metronidazole], Metronidazole benzo+syrspend, Morphine and related, and Remeron [mirtazapine]   ROS:  Please see the history of present illness.   Otherwise, review of systems are positive for none.  All other systems are reviewed and negative.    PHYSICAL EXAM: VS:  BP (!) 122/58   Pulse 79   Ht 5\' 3"  (1.6 m)   Wt 123 lb (55.8 kg)   SpO2 95%   BMI 21.79 kg/m  , BMI Body mass index is 21.79 kg/m. GENERAL:  Well appearing NECK:  No jugular venous distention,  waveform within normal limits, carotid upstroke brisk and symmetric, no bruits, no thyromegaly LUNGS:  Clear to auscultation bilaterally CHEST:  Unremarkable HEART:  PMI not displaced or sustained,S1 and S2 within normal limits, no S3, no S4, no clicks, no rubs, no murmurs ABD:  Flat, positive bowel sounds normal in frequency in pitch, no bruits, no rebound, no guarding, no midline pulsatile mass, no hepatomegaly, no splenomegaly EXT:  2 plus pulses throughout, no edema, no cyanosis no clubbing  EKG:  EKG is  ordered today. Sinus rhythm, rate 79, axis within normal limits, intervals within normal, no acute ST-T wave changes.  Recent Labs: 03/09/2021: ALT 21; BUN 13; Creatinine, Ser 0.73; Hemoglobin 12.0; Platelets 336; Potassium 3.9; Sodium 139; TSH 2.351    Lipid Panel    Component Value  Date/Time   CHOL 251 (H) 03/09/2021 1206   TRIG 319 (H) 03/09/2021 1206   HDL 45 03/09/2021 1206   CHOLHDL 5.6 03/09/2021 1206   VLDL 64 (H) 03/09/2021 1206   LDLCALC 142 (H) 03/09/2021 1206      Wt Readings from Last 3 Encounters:  12/09/21 123 lb (55.8 kg)  09/05/20 130 lb (59 kg)  09/21/19 128 lb (58.1 kg)      Other studies Reviewed: Additional studies/ records that were reviewed today include: None.  . Review of the above records demonstrates:     ASSESSMENT AND PLAN:  HTN:   Her blood pressure is excellent.  She will continue the meds as listed.  No change in therapy.   DM: Her A1c was excellent by her report although I do not have these records.  No change in therapy.     Current medicines are reviewed at length with the patient today.  The patient does not have concerns regarding medicines.  The following changes have been made:   None  Labs/ tests ordered today include: NA  Orders Placed This Encounter  Procedures   EKG 12-Lead      Disposition:   FU with me in one year.    Signed, Rollene Rotunda, MD  12/09/2021 3:39 PM    [facility]

## 2021-12-08 DIAGNOSIS — Z9189 Other specified personal risk factors, not elsewhere classified: Secondary | ICD-10-CM

## 2021-12-08 NOTE — Progress Notes (Signed)
[facility])                                            [facility] Quality Pharmacy Team                                        Statin Quality Measure Assessment    12/08/2021  Jaclyn [name] [birth year] [id number]  Per review of chart and payor information, this patient has been flagged for non-adherence to the following CMS Quality Measure:   [x]  Statin Use in Persons with Diabetes  []  Statin Use in Persons with Cardiovascular Disease  The 10-year ASCVD risk score (Arnett DK, et al., 2019) is: 15.2%   Values used to calculate the score:     Age: 65 years     Sex: Female     Is Non-Hispanic African American: No     Diabetic: Yes     Tobacco smoker: No     Systolic Blood Pressure: 132 mmHg     Is BP treated: Yes     HDL Cholesterol: 45 mg/dL     Total Cholesterol: 251 mg/dL     LDL 2020 mg/dL (as of 66)  ASCVD risk score 16% and no statin on file or h/o of intolerance. Most recent LFTS WNL. No documented antihyperlipidemic medication on file. If deemed therapeutically appropriate, please consider statin initiation or if patient has an intolerance to statin to associate an exclusion code (see options below) at the next O/v on 12/09/2021.   Please consider ONE of the following recommendations:   Initiate high intensity statin Atorvastatin 40mg  once daily, #90, 3 refills   Rosuvastatin 20mg  once daily, #90, 3 refills    Initiate moderate intensity          statin with reduced frequency if prior          statin intolerance 1x weekly, #13, 3 refills   2x weekly, #26, 3 refills   3x weekly, #39, 3 refills    Code for past statin intolerance or other exclusions (required annually)  Drug Induced Myopathy G72.0   Myositis, unspecified M60.9   Rhabdomyolysis M62.82   Prediabetes R73.03   Adverse effect of antihyperlipidemic and antiarteriosclerotic drugs, initial encounter 7/62/8315    Thank you for your time,  12/11/2021,  PharmD Clinical Pharmacist [facility] Cell: [phone]

## 2021-12-09 ENCOUNTER — Other Ambulatory Visit: Payer: Self-pay

## 2021-12-09 ENCOUNTER — Ambulatory Visit: Payer: Medicare Other | Admitting: Cardiology

## 2021-12-09 ENCOUNTER — Encounter: Payer: Self-pay | Admitting: Cardiology

## 2021-12-09 VITALS — BP 122/58 | HR 79 | Ht 63.0 in | Wt 123.0 lb

## 2021-12-09 DIAGNOSIS — E118 Type 2 diabetes mellitus with unspecified complications: Secondary | ICD-10-CM

## 2021-12-09 DIAGNOSIS — I10 Essential (primary) hypertension: Secondary | ICD-10-CM

## 2021-12-09 MED ORDER — OLMESARTAN-AMLODIPINE-HCTZ 40-10-25 MG PO TABS: ORAL_TABLET | ORAL | 3 refills | Status: DC

## 2021-12-09 NOTE — Patient Instructions (Signed)
Medication Instructions:  °Your physician recommends that you continue on your current medications as directed. Please refer to the Current Medication list given to you today.  °*If you need a refill on your cardiac medications before your next appointment, please call your pharmacy* ° ° °Lab Work: °None °If you have labs (blood work) drawn today and your tests are completely normal, you will receive your results only by: °MyChart Message (if you have MyChart) OR °A paper copy in the mail °If you have any lab test that is abnormal or we need to change your treatment, we will call you to review the results. ° ° °Testing/Procedures: °None ° ° °Follow-Up: °At [facility], you and your health needs are our priority.  As part of our continuing mission to provide you with exceptional heart care, we have created designated Provider Care Teams.  These Care Teams include your primary Cardiologist (physician) and Advanced Practice Providers (APPs -  Physician Assistants and Nurse Practitioners) who all work together to provide you with the care you need, when you need it. ° °We recommend signing up for the patient portal called "MyChart".  Sign up information is provided on this After Visit Summary.  MyChart is used to connect with patients for Virtual Visits (Telemedicine).  Patients are able to view lab/test results, encounter notes, upcoming appointments, etc.  Non-urgent messages can be sent to your provider as well.   °To learn more about what you can do with MyChart, go to [email or web address].   ° °Your next appointment:   °1 year(s) ° °The format for your next appointment:   °In Person ° °Provider:   °James Hochrein, MD   ° ° °Other Instructions °   °

## 2022-03-11 ENCOUNTER — Ambulatory Visit (HOSPITAL_COMMUNITY): Admit: 2022-03-11 | Payer: Medicare Other | Source: Home / Self Care

## 2022-03-11 ENCOUNTER — Ambulatory Visit (HOSPITAL_COMMUNITY)
Admission: EM | Admit: 2022-03-11 | Discharge: 2022-03-11 | Disposition: A | Discharge status: Home / Self Care | Payer: Medicare Other

## 2022-03-11 NOTE — Progress Notes (Signed)
?   03/11/22 2042  ?[place] Triage Screening (Walk-ins at [facility] only)  ?How Did You Hear About [place]? Family/Friend  ?What Is the Reason for Your Visit/Call Today? Jaclyn [name] is a 66 year old female presenting voluntary as a walk-in to [facility] due to depression. Patient is accompanied by her daughter Torria Grealish, consent received from patient to allow daughter to be present during triage assessment. Patient denied SI, HI, psychosis and alcohol/drug usage. Patient reported last time she was suicidal was 1 year ago. Ciara reported that her mother hasn't progressed any since her last suicide attempt of overdose in 2013 where she was taken to ED. Ciara reported her mother needs something more, stating "she has no future goals, she is doing nothing everyday, she needs to get better". Patient is currently seeing Hoyle Sauer at [facility] for medication management and patient reported feeling that her medications were working.  ?How Long Has This Been Causing You Problems? > than 6 months  ?Have You Recently Had Any Thoughts About Hurting Yourself? No  ?Are You Planning to Commit Suicide/Harm Yourself At This time? No  ?Have you Recently Had Thoughts About [place]? No  ?Are You Planning To Harm Someone At This Time? No  ?Are you currently experiencing any auditory, visual or other hallucinations? No  ?Have You Used Any Alcohol or Drugs in the Past 24 Hours? No  ?Do you have any current medical co-morbidities that require immediate attention? No  ?Clinician description of patient physical appearance/behavior: casual / cooperative  ?What Do You Feel Would Help You the Most Today? Treatment for Depression or other mood problem  ?If access to [facility] was not available, would you have sought care in the Emergency Department? No  ?Determination of Need Routine (7 days)  ?Options For Referral Outpatient Therapy;Medication Management  ? ? ?

## 2022-03-11 NOTE — ED Notes (Signed)
Pt walked out prior to assessment.  Left with family. ?

## 2022-03-11 NOTE — ED Notes (Signed)
Pt access admitted, pt by mistake. ?

## 2022-04-01 ENCOUNTER — Telehealth (HOSPITAL_COMMUNITY): Payer: Self-pay | Admitting: Family Medicine

## 2022-04-01 NOTE — BH Assessment (Signed)
Care Management - [facility] Follow Up Discharges  ? ?Writer attempted to make contact with patient today and was unsuccessful.  Writer left a HIPPA compliant voice message.  ? ?Per chart review, patient will follow up with established provider Eber Jones at [facility] for medication management  ?

## 2022-04-28 ENCOUNTER — Ambulatory Visit (INDEPENDENT_AMBULATORY_CARE_PROVIDER_SITE_OTHER): Payer: Medicare Other | Admitting: Family

## 2022-04-28 ENCOUNTER — Encounter: Payer: Self-pay | Admitting: Family

## 2022-04-28 VITALS — BP 133/75 | HR 83 | Temp 98.1°F | Ht 63.0 in | Wt 121.2 lb

## 2022-04-28 DIAGNOSIS — E1165 Type 2 diabetes mellitus with hyperglycemia: Secondary | ICD-10-CM | POA: Diagnosis not present

## 2022-04-28 DIAGNOSIS — I152 Hypertension secondary to endocrine disorders: Secondary | ICD-10-CM | POA: Diagnosis not present

## 2022-04-28 DIAGNOSIS — E785 Hyperlipidemia, unspecified: Secondary | ICD-10-CM

## 2022-04-28 DIAGNOSIS — E1169 Type 2 diabetes mellitus with other specified complication: Secondary | ICD-10-CM | POA: Diagnosis not present

## 2022-04-28 DIAGNOSIS — Z794 Long term (current) use of insulin: Secondary | ICD-10-CM

## 2022-04-28 DIAGNOSIS — K2970 Gastritis, unspecified, without bleeding: Secondary | ICD-10-CM | POA: Insufficient documentation

## 2022-04-28 HISTORY — DX: Gastritis, unspecified, without bleeding: K29.70

## 2022-04-28 NOTE — Assessment & Plan Note (Signed)
Chronic - managed by ENDO  ?

## 2022-04-28 NOTE — Assessment & Plan Note (Signed)
Chronic - managed by Cardiology ?

## 2022-04-28 NOTE — Assessment & Plan Note (Signed)
Chronic - pt managed by [facility] ?

## 2022-04-28 NOTE — Assessment & Plan Note (Signed)
Chronic - managed by Cardio/ENDO ?

## 2022-04-28 NOTE — Progress Notes (Signed)
? ?New Patient Office Visit ? ?Subjective:  ?Patient ID: Jaclyn [name], female    DOB: 1956-04-16  Age: 66 y.o. MRN: [id number] ? ?CC:  ?Chief Complaint  ?Patient presents with  ? Establish Care  ? ?HPI ?Jaclyn [name] presents for establishing care. ?T2DM: Pt is currently maintained on the following medications for diabetes:Glipizde, Metformin, Levemir bid ?Failed meds include: none ?Denies polyuria/polydipsia/polyphagia ?Denies hypoglycemia ?Home glucose readings range: 80-280 fasting & postprandial ?Last A1C was  ?Lab Results  ?Component Value Date  ? HGBA1C 7.7 (H) 03/09/2021  ? HGBA1C 7.9 (H) 02/23/2021  ? HGBA1C 7.3 (H) 04/04/2015  ?Hypertension: Patient is currently maintained on the following medications for blood pressure: Olmesartan-amlodipine-HCT ?Failed meds include: none ?Patient reports good compliance with blood pressure medications. ?Patient denies chest pain, headaches, shortness of breath or swelling. ?Last 3 blood pressure readings in our office are as follows: ?BP Readings from Last 3 Encounters:  ?04/28/22 133/75  ?12/09/21 (!) 122/58  ?10/20/21 132/82  ?Hyperlipidemia: Patient is currently maintained on the following medication for hyperlipidemia: Crestor. Patient denies myalgia or other side effects. ?Patient reports good compliance with low fat/low cholesterol diet.  ?Last lipid panel as follows: ?Lab Results  ?Component Value Date  ? CHOL 251 (H) 03/09/2021  ? HDL 45 03/09/2021  ? LDLCALC 142 (H) 03/09/2021  ? TRIG 319 (H) 03/09/2021  ? CHOLHDL 5.6 03/09/2021  ? ? ?Assessment & Plan:  ? ?Problem List Items Addressed This Visit   ? ?  ? Cardiovascular and Mediastinum  ? Hypertension - Primary  ?  Chronic - managed by Cardiology ? ?  ?  ?  ? Endocrine  ? DM type 2 (diabetes mellitus, type 2) (HCC)  ?  Chronic - managed by ENDO  ? ?  ?  ? Relevant Medications  ? glipiZIDE (GLUCOTROL) 10 MG tablet  ? Hyperlipidemia associated with type 2 diabetes mellitus (HCC)  ?  Chronic - managed by  Cardio/ENDO ? ?  ?  ? Relevant Medications  ? glipiZIDE (GLUCOTROL) 10 MG tablet  ? ? ?Past Medical History:  ?Diagnosis Date  ? Abnormal EKG 11/09/2012  ? Arthritis   ? Delirium, drug-induced 11/10/2012  ? Depression   ? Educated about COVID-19 virus infection 04/24/2019  ? Eustachian tube dysfunction, bilateral 01/19/2019  ? Gastritis 04/28/2022  ? Hypertension   ? Referred otalgia of both ears 01/19/2019  ? Suicidal behavior 11/09/2012  ? ? ?Past Surgical History:  ?Procedure Laterality Date  ? NASAL SEPTUM SURGERY    ? TUBAL LIGATION    ? ? ?Objective:  ? ?Today's Vitals: BP 133/75 (BP Location: Left Arm, Patient Position: Sitting, Cuff Size: Large)   Pulse 83   Temp 98.1 ?F (36.7 ?C) (Temporal)   Ht 5\' 3"  (1.6 m)   Wt 121 lb 4 oz (55 kg)   SpO2 97%   BMI 21.48 kg/m?  ? ?Physical Exam ?Vitals and nursing note reviewed.  ?Constitutional:   ?   Appearance: Normal appearance.  ?Cardiovascular:  ?   Rate and Rhythm: Normal rate and regular rhythm.  ?Pulmonary:  ?   Effort: Pulmonary effort is normal.  ?   Breath sounds: Normal breath sounds.  ?Musculoskeletal:     ?   General: Normal range of motion.  ?Skin: ?   General: Skin is warm and dry.  ?Neurological:  ?   Mental Status: She is alert.  ?Psychiatric:     ?   Mood and Affect: Mood normal. Affect is flat.     ?  Behavior: Behavior normal.  ? ? ?Outpatient Encounter Medications as of 04/28/2022  ?Medication Sig  ? busPIRone (BUSPAR) 10 MG tablet Take 1 tablet (10 mg total) by mouth 3 (three) times daily.  ? glucose blood test strip Use to check blood sugar twice daily (Patient taking differently: 1 each by Other route as needed (blood sugar). One touch verio--Use to check blood sugar twice daily)  ? Insulin Detemir (LEVEMIR FLEXTOUCH) 100 UNIT/ML Pen Inject 28 Units into the skin daily at 10 pm. (Patient taking differently: Inject 30 Units into the skin 2 (two) times daily.)  ? Insulin Pen Needle (BD PEN NEEDLE NANO U/F) 32G X 4 MM MISC Test daily.  ? metFORMIN  (GLUCOPHAGE) 1000 MG tablet TAKE ONE TABLET BY MOUTH TWICE DAILY WITH  A  MEAL (Patient taking differently: Take 1,000 mg by mouth 2 (two) times daily with a meal.)  ? Multiple Vitamin (MULTIVITAMIN WITH MINERALS) TABS tablet Take 1 tablet by mouth daily.  ? omeprazole (PRILOSEC) 40 MG capsule Take 40 mg by mouth daily.  ? risperiDONE (RISPERDAL) 0.5 MG tablet TAKE ONE TABLET BY MOUTH TWICE DAILY FOR MOOD  CONTROL. (Patient taking differently: Take 0.5-1 mg by mouth See admin instructions. 0.5mg  in AM and 1mg  in evening)  ? rosuvastatin (CRESTOR) 10 MG tablet 1 tablet  ? [DISCONTINUED] metFORMIN (GLUCOPHAGE) 1000 MG tablet 1 tablet with a meal  ? [DISCONTINUED] Olmesartan-amLODIPine-HCTZ 40-10-25 MG TABS Take 1 tablet by mouth once daily  ? [DISCONTINUED] sertraline (ZOLOFT) 100 MG tablet Take 200 mg by mouth daily.   ? glipiZIDE (GLUCOTROL) 10 MG tablet Take 10 mg by mouth every morning.  ? [DISCONTINUED] amLODipine (NORVASC) 10 MG tablet 2 tablet  ? [DISCONTINUED] busPIRone (BUSPAR) 10 MG tablet 1 tablet  ? [DISCONTINUED] omeprazole (PRILOSEC) 40 MG capsule 1 capsule 30 minutes before morning meal  ? ?No facility-administered encounter medications on file as of 04/28/2022.  ? ? ?Follow-up: Return for any future concerns.  ? ?06/28/2022, NP ?

## 2022-04-28 NOTE — Patient Instructions (Signed)
Welcome to [facility] at [facility]! It was a pleasure meeting you today.    PLEASE NOTE:  If you had any LAB tests please let us know if you have not heard back within a few days. You may see your results on MyChart before we have a chance to review them but we will give you a call once they are reviewed by us. If we ordered any REFERRALS today, please let us know if you have not heard from their office within the next week.  Let us know through MyChart if you are needing REFILLS, or have your pharmacy send us the request. You can also use MyChart to communicate with me or any office staff.  Please try these tips to maintain a healthy lifestyle:  Eat most of your calories during the day when you are active. Eliminate processed foods including packaged sweets (pies, cakes, cookies), reduce intake of potatoes, white bread, white pasta, and white rice. Look for whole grain options, oat flour or almond flour.  Each meal should contain half fruits/vegetables, one quarter protein, and one quarter carbs (no bigger than a computer mouse).  Cut down on sweet beverages. This includes juice, soda, and sweet tea. Also watch fruit intake, though this is a healthier sweet option, it still contains natural sugar! Limit to 3 servings daily.  Drink at least 1 glass of water with each meal and aim for at least 8 glasses per day  Exercise at least 150 minutes every week.   

## 2022-05-14 ENCOUNTER — Ambulatory Visit: Payer: Medicare Other | Admitting: Family

## 2022-06-08 ENCOUNTER — Ambulatory Visit: Payer: Medicare Other | Admitting: Family

## 2022-06-08 ENCOUNTER — Encounter: Payer: Self-pay | Admitting: Family

## 2022-06-08 NOTE — Telephone Encounter (Signed)
Jaclyn [name], we should have blank FL2 forms in the office, if you can please start filling in the form with her diagnoses, meds, etc and then I can complete what's left and sign when I get back, Thanks!

## 2022-06-17 NOTE — Telephone Encounter (Signed)
-----   Message from Dulce Sellar, NP sent at 06/17/2022  9:52 AM EDT ----- Regarding: FL2 form Call Richie and let her know I do need to speak with her to complete the FL2 form, can she do virtual visit?  Thanks

## 2022-06-17 NOTE — Telephone Encounter (Signed)
Lvm for pt to call back to see if she is able to do a virtual visit or come in to complete the FL2 form with [place].

## 2022-07-20 ENCOUNTER — Telehealth: Payer: Self-pay | Admitting: Family

## 2022-07-20 NOTE — Telephone Encounter (Signed)
Copied from CRM [id number]. Topic: Medicare AWV >> Jul 20, 2022  2:41 PM Payton Doughty wrote: Reason for CRM: Left message for patient to schedule Annual Wellness Visit.  Please schedule with Nurse Health Advisor Lanier Ensign, RN at [facility]. This appt can be telephone or office visit. Please call [phone] ask for [facility]

## 2022-07-28 ENCOUNTER — Encounter: Payer: Self-pay | Admitting: Emergency Medicine

## 2022-07-28 ENCOUNTER — Ambulatory Visit
Admission: EM | Admit: 2022-07-28 | Discharge: 2022-07-28 | Disposition: A | Discharge status: Home / Self Care | Payer: Medicare Other | Attending: Urgent Care | Admitting: Urgent Care

## 2022-07-28 DIAGNOSIS — R519 Headache, unspecified: Secondary | ICD-10-CM | POA: Diagnosis not present

## 2022-07-28 DIAGNOSIS — W19XXXA Unspecified fall, initial encounter: Secondary | ICD-10-CM

## 2022-07-28 DIAGNOSIS — E119 Type 2 diabetes mellitus without complications: Secondary | ICD-10-CM | POA: Diagnosis not present

## 2022-07-28 DIAGNOSIS — Z794 Long term (current) use of insulin: Secondary | ICD-10-CM

## 2022-07-28 DIAGNOSIS — Z23 Encounter for immunization: Secondary | ICD-10-CM | POA: Diagnosis not present

## 2022-07-28 DIAGNOSIS — S0003XA Contusion of scalp, initial encounter: Secondary | ICD-10-CM | POA: Diagnosis not present

## 2022-07-28 LAB — POCT FASTING CBG KUC MANUAL ENTRY: POCT Glucose (KUC): 163 mg/dL — AB (ref 70–99)

## 2022-07-28 MED ORDER — ACETAMINOPHEN 325 MG PO TABS: 650.0000 mg | ORAL_TABLET | Freq: Four times a day (QID) | ORAL | 0 refills | Status: AC | PRN

## 2022-07-28 MED ORDER — TETANUS-DIPHTH-ACELL PERTUSSIS 5-2.5-18.5 LF-MCG/0.5 IM SUSY
0.5000 mL | PREFILLED_SYRINGE | Freq: Once | INTRAMUSCULAR | Status: AC
  Administered 2022-07-28: 0.5 mL via INTRAMUSCULAR

## 2022-07-28 NOTE — ED Provider Notes (Signed)
[facility]   MRN: [id number] DOB: November 29, 1956  Subjective:   Jaclyn [name] is a 66 y.o. 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  Patient presents with her daughter.  The patient reports that she was outside in the back porch sweeping when she lost her footing and accidentally fell backwards hitting the back of her head.  The daughter heard the impact and went to her aid.  Both deny loss of consciousness.  Both also deny that there is any change to her mental status and she is at her baseline.  She denies a headache, vision change, weakness, numbness or tingling, nausea, vomiting.  Reports that the only pain she has is directly over the back of her head where she made impact against a concrete pavement.  Cannot recall her last Tdap.  She also has type 2 diabetes treated with insulin.  She did a blood sugar check at home and was in the lower 100s.  She ate about 30 minutes before coming into the clinic.  No current facility-administered medications for this encounter.  Current Outpatient Medications:    busPIRone (BUSPAR) 10 MG tablet, Take 1 tablet (10 mg total) by mouth 3 (three) times daily., Disp: , Rfl:    glipiZIDE (GLUCOTROL) 10 MG tablet, Take 10 mg by mouth every morning., Disp: , Rfl:    glucose blood test strip, Use to check blood sugar twice daily (Patient taking differently: 1 each by Other route as needed (blood sugar). One touch verio--Use to check blood sugar twice daily), Disp: 100 each, Rfl: 5   Insulin Detemir (LEVEMIR FLEXTOUCH) 100 UNIT/ML Pen, Inject 28 Units into the skin daily at 10 pm. (Patient taking differently: Inject 30 Units into the skin 2 (two) times daily.), Disp: 9 mL, Rfl: 0   Insulin Pen Needle (BD PEN NEEDLE NANO U/F) 32G X 4 MM MISC, Test daily., Disp: 100 each, Rfl: 5   metFORMIN (GLUCOPHAGE) 1000 MG tablet, TAKE ONE TABLET BY MOUTH TWICE DAILY WITH  A  MEAL (Patient  taking differently: Take 1,000 mg by mouth 2 (two) times daily with a meal.), Disp: 180 tablet, Rfl: 3   Multiple Vitamin (MULTIVITAMIN WITH MINERALS) TABS tablet, Take 1 tablet by mouth daily., Disp: , Rfl:    omeprazole (PRILOSEC) 40 MG capsule, Take 40 mg by mouth daily., Disp: , Rfl:    risperiDONE (RISPERDAL) 0.5 MG tablet, TAKE ONE TABLET BY MOUTH TWICE DAILY FOR MOOD  CONTROL. (Patient taking differently: Take 0.5-1 mg by mouth See admin instructions. 0.5mg  in AM and 1mg  in evening), Disp: 180 tablet, Rfl: 0   rosuvastatin (CRESTOR) 10 MG tablet, 1 tablet, Disp: , Rfl:    Allergies  Allergen Reactions   Codeine Nausea And Vomiting    States that she cannot take in high doses   Flagyl [Metronidazole] Other (See Comments)    Fast heartbeat,tingly   Metronidazole Benzo+Syrspend    Morphine And Related Nausea And Vomiting    States that she cannot take in high doses   Remeron [Mirtazapine] Other (See Comments)    Severe restlessness,irritable,hyperactivity,agitation   Duloxetine Hcl Rash    Past Medical History:  Diagnosis Date   Abnormal EKG 11/09/2012   Arthritis    Delirium, drug-induced 11/10/2012   Depression    Educated about COVID-19 virus infection 04/24/2019   Eustachian tube dysfunction, bilateral 01/19/2019   Gastritis 04/28/2022   Hypertension  Referred otalgia of both ears 01/19/2019   Suicidal behavior 11/09/2012     Past Surgical History:  Procedure Laterality Date   NASAL SEPTUM SURGERY     TUBAL LIGATION      Family History  Problem Relation Age of Onset   Huntington's disease Father     Social History   Tobacco Use   Smoking status: Never   Smokeless tobacco: Never  Vaping Use   Vaping Use: Never used  Substance Use Topics   Alcohol use: No   Drug use: No    ROS   Objective:   Vitals: BP 119/70   Pulse 76   Temp 98.5 F (36.9 C)   Resp 16   SpO2 98%   Physical Exam Constitutional:      General: She is not in acute distress.     Appearance: Normal appearance. She is well-developed and normal weight. She is not ill-appearing, toxic-appearing or diaphoretic.  HENT:     Head: Normocephalic and atraumatic.      Right Ear: Tympanic membrane, ear canal and external ear normal. No drainage or tenderness. No middle ear effusion. There is no impacted cerumen. Tympanic membrane is not erythematous.     Left Ear: Tympanic membrane, ear canal and external ear normal. No drainage or tenderness.  No middle ear effusion. There is no impacted cerumen. Tympanic membrane is not erythematous.     Nose: Nose normal. No congestion or rhinorrhea.     Mouth/Throat:     Mouth: Mucous membranes are moist. No oral lesions.     Pharynx: No pharyngeal swelling, oropharyngeal exudate, posterior oropharyngeal erythema or uvula swelling.     Tonsils: No tonsillar exudate or tonsillar abscesses.  Eyes:     General: No scleral icterus.       Right eye: No discharge.        Left eye: No discharge.     Extraocular Movements: Extraocular movements intact.     Right eye: Normal extraocular motion.     Left eye: Normal extraocular motion.     Conjunctiva/sclera: Conjunctivae normal.  Neck:     Meningeal: Brudzinski's sign and Kernig's sign absent.  Cardiovascular:     Rate and Rhythm: Normal rate.  Pulmonary:     Effort: Pulmonary effort is normal.  Musculoskeletal:     Cervical back: Normal range of motion and neck supple.  Lymphadenopathy:     Cervical: No cervical adenopathy.  Skin:    General: Skin is warm and dry.  Neurological:     General: No focal deficit present.     Mental Status: She is alert and oriented to person, place, and time.     Cranial Nerves: No cranial nerve deficit, dysarthria or facial asymmetry.     Motor: No weakness or pronator drift.     Coordination: Romberg sign negative. Coordination abnormal. Finger-Nose-Finger Test and Heel to [facility] Test normal. Rapid alternating movements normal.     Gait: Gait abnormal.  Tandem walk normal.     Deep Tendon Reflexes: Reflexes normal.     Comments: Abnormal gait and coordination is not new per patient and her daughter.  Psychiatric:        Mood and Affect: Mood normal.        Behavior: Behavior normal.        Thought Content: Thought content normal.        Judgment: Judgment normal.     Assessment and Plan :   PDMP not reviewed this encounter.  1. Contusion of scalp, initial encounter   2. Accidental fall, initial encounter   3. Scalp pain   4. Type 2 diabetes mellitus treated with insulin (HCC)    Recommended conservative management for scalp contusion.  Use RICE method, Tylenol for pain.  At this time, patient has a reassuring neurologic exam and by her report and the patient's caregiver, her daughter she is at her baseline.  I reviewed signs of head injury, brain injury extensively.  Patient's caregiver will maintain ER precautions.  Tdap was updated in clinic.  Counseled patient on potential for adverse effects with medications prescribed/recommended today, ER and return-to-clinic precautions discussed, patient verbalized understanding.    Wallis Bamberg, PA-C 07/28/22 1859

## 2022-07-28 NOTE — Discharge Instructions (Addendum)
Do not use any nonsteroidal anti-inflammatories (NSAIDs) like ibuprofen, Motrin, naproxen, Aleve, etc. which are all available over-the-counter.  Please just use Tylenol at a dose of 500mg -650mg  once every 6 hours as needed for your aches, pains, fevers.  If you develop confusion, vision changes, dizziness, weakness, bruising around the eyes or lower part of the back of your head, nausea and vomiting then these could be signs of a head injury. Please head to the emergency room if this develops.

## 2022-07-28 NOTE — ED Triage Notes (Signed)
Pt here after a mechanical fall onto concrete while sweeping today. Pt has a wound to the back of her head that is painful. No LOC, no vision changes. Pt is at her cognitive baseline according to daughter and normally has an unsteady gait.

## 2022-08-28 ENCOUNTER — Encounter (HOSPITAL_COMMUNITY): Payer: Self-pay

## 2022-08-28 ENCOUNTER — Emergency Department (HOSPITAL_COMMUNITY)
Admission: EM | Admit: 2022-08-28 | Discharge: 2022-08-28 | Disposition: A | Discharge status: Home / Self Care | Payer: Medicare Other | Attending: Emergency Medicine | Admitting: Emergency Medicine

## 2022-08-28 ENCOUNTER — Other Ambulatory Visit: Payer: Self-pay

## 2022-08-28 DIAGNOSIS — S01511A Laceration without foreign body of lip, initial encounter: Secondary | ICD-10-CM | POA: Insufficient documentation

## 2022-08-28 DIAGNOSIS — W010XXA Fall on same level from slipping, tripping and stumbling without subsequent striking against object, initial encounter: Secondary | ICD-10-CM | POA: Insufficient documentation

## 2022-08-28 DIAGNOSIS — I10 Essential (primary) hypertension: Secondary | ICD-10-CM | POA: Insufficient documentation

## 2022-08-28 DIAGNOSIS — S0993XA Unspecified injury of face, initial encounter: Secondary | ICD-10-CM | POA: Diagnosis present

## 2022-08-28 DIAGNOSIS — Z79899 Other long term (current) drug therapy: Secondary | ICD-10-CM | POA: Diagnosis not present

## 2022-08-28 DIAGNOSIS — W19XXXA Unspecified fall, initial encounter: Secondary | ICD-10-CM

## 2022-08-28 MED ORDER — ACETAMINOPHEN 325 MG PO TABS
650.0000 mg | ORAL_TABLET | Freq: Once | ORAL | Status: AC
  Administered 2022-08-28: 650 mg via ORAL
  Filled 2022-08-28: qty 2

## 2022-08-28 MED ORDER — LIDOCAINE-EPINEPHRINE-TETRACAINE (LET) TOPICAL GEL
3.0000 mL | Freq: Once | TOPICAL | Status: AC
  Administered 2022-08-28: 3 mL via TOPICAL
  Filled 2022-08-28: qty 3

## 2022-08-28 NOTE — ED Provider Triage Note (Signed)
Emergency Medicine Provider Triage Evaluation Note  Jaclyn [name] , a 66 y.o. female  was evaluated in triage.  Pt complains of falling earlier today.  Patient states she tripped on her feet.  Denies chest pain, shortness of breath.  Denies loss of consciousness.  She is not on anticoagulation.  Denies neck pain.  Blood noted within her mouth.  Denies pain over her teeth.  Review of Systems  Positive: As above Negative: As above  Physical Exam  BP 123/62 (BP Location: Right Arm)   Pulse 85   Temp 98.2 F (36.8 C) (Oral)   Resp 16   Ht 5\' 3"  (1.6 m)   Wt 54.4 kg   SpO2 97%   BMI 21.26 kg/m  Gen:   Awake, no distress   Resp:  Normal effort  MSK:   Moves extremities without difficulty  Other:    Medical Decision Making  Medically screening exam initiated at 3:36 PM.  Appropriate orders placed.  Jaclyn [name] was informed that the remainder of the evaluation will be completed by another provider, this initial triage assessment does not replace that evaluation, and the importance of remaining in the ED until their evaluation is complete.     Trey Sailors, PA-C 08/28/22 1538

## 2022-08-28 NOTE — Discharge Instructions (Addendum)
Your lip laceration today was repaired with 2 absorbable sutures.  These will dissolve on their own in about 5 days.  You do not need to return to have these taken out.  If you have worsening pain or concerning symptoms please return to the emergency room for evaluation.  Otherwise follow-up with your primary care provider as needed.  You can take Tylenol ibuprofen as you need to for pain control.  If you notice fever without other explanation of your fever, or purulent drainage from this area please return as this is concerning for infection.

## 2022-08-28 NOTE — ED Provider Notes (Signed)
[facility]-EMERGENCY DEPT Provider Note   CSN: [id number] Arrival date & time: 08/28/22  1450     History  Chief Complaint  Patient presents with   Fall   Facial Injury    Jaclyn [name] is a 66 y.o. female.  66 year old female presents with her daughter for evaluation of lip laceration.  This occurred just prior to arrival.  Patient states she tripped over her foot and fell forward.  Falling face first.  She denies any other injuries.  She denies loss of consciousness.  Is not on anticoagulation.  Currently without headache.  Denies neck pain.  She denies surrounding prodromal symptoms such as chest pain, shortness of breath, lightheadedness, or palpitations.   Fall Pertinent negatives include no chest pain, no headaches and no shortness of breath.  Facial Injury Associated symptoms: no headaches        Home Medications Prior to Admission medications   Medication Sig Start Date End Date Taking? Authorizing Provider  acetaminophen (TYLENOL) 325 MG tablet Take 2 tablets (650 mg total) by mouth every 6 (six) hours as needed for moderate pain. 07/28/22   Wallis Bamberg, PA-C  busPIRone (BUSPAR) 10 MG tablet Take 1 tablet (10 mg total) by mouth 3 (three) times daily. 03/11/21   Estella Husk, MD  glipiZIDE (GLUCOTROL) 10 MG tablet Take 10 mg by mouth every morning. 12/07/21   [provider]  glucose blood test strip Use to check blood sugar twice daily Patient taking differently: 1 each by Other route as needed (blood sugar). One touch verio--Use to check blood sugar twice daily 02/27/15   Worthy Rancher B, FNP  Insulin Detemir (LEVEMIR FLEXTOUCH) 100 UNIT/ML Pen Inject 28 Units into the skin daily at 10 pm. Patient taking differently: Inject 30 Units into the skin 2 (two) times daily. 07/04/15   Terressa Koyanagi, DO  Insulin Pen Needle (BD PEN NEEDLE NANO U/F) 32G X 4 MM MISC Test daily. 09/11/14   Toniann Ket, PA-C  metFORMIN (GLUCOPHAGE) 1000 MG tablet  TAKE ONE TABLET BY MOUTH TWICE DAILY WITH  A  MEAL Patient taking differently: Take 1,000 mg by mouth 2 (two) times daily with a meal. 04/06/15   Terressa Koyanagi, DO  Multiple Vitamin (MULTIVITAMIN WITH MINERALS) TABS tablet Take 1 tablet by mouth daily.    [provider]  omeprazole (PRILOSEC) 40 MG capsule Take 40 mg by mouth daily. 01/01/20   [provider]  risperiDONE (RISPERDAL) 0.5 MG tablet TAKE ONE TABLET BY MOUTH TWICE DAILY FOR MOOD  CONTROL. Patient taking differently: Take 0.5-1 mg by mouth See admin instructions. 0.5mg  in AM and 1mg  in evening 05/24/15   05/26/15 B, FNP  rosuvastatin (CRESTOR) 10 MG tablet 1 tablet 12/09/21   [provider]      Allergies    Codeine, Flagyl [metronidazole], Metronidazole benzo+syrspend, Morphine and related, Remeron [mirtazapine], and Duloxetine hcl    Review of Systems   Review of Systems  Respiratory:  Negative for shortness of breath.   Cardiovascular:  Negative for chest pain and palpitations.  Skin:  Positive for wound.  Neurological:  Negative for light-headedness and headaches.  All other systems reviewed and are negative.   Physical Exam Updated Vital Signs BP 123/62 (BP Location: Right Arm)   Pulse 85   Temp 98.2 F (36.8 C) (Oral)   Resp 16   Ht 5\' 3"  (1.6 m)   Wt 54.4 kg   SpO2 97%   BMI  21.26 kg/m  Physical Exam Vitals and nursing note reviewed.  Constitutional:      General: She is not in acute distress.    Appearance: Normal appearance. She is not ill-appearing.  HENT:     Head: Normocephalic and atraumatic.     Nose: Nose normal.  Eyes:     Conjunctiva/sclera: Conjunctivae normal.  Pulmonary:     Effort: Pulmonary effort is normal. No respiratory distress.  Musculoskeletal:        General: No deformity.     Comments: Cervical, thoracic, lumbar spine without tenderness to palpation.  Full range of motion in bilateral upper and lower extremities in all major joints.  All major  joints without tenderness to palpation.  Laceration noted to internal upper lip on the right side.  This measures about 1 cm.  No active bleeding noted.  This is not a through and through laceration.  Vermilion border is spared.  Skin:    Findings: No rash.  Neurological:     Mental Status: She is alert.     ED Results / Procedures / Treatments   Labs (all labs ordered are listed, but only abnormal results are displayed) Labs Reviewed - No data to display  EKG None  Radiology No results found.  Procedures .Marland KitchenLaceration Repair  Date/Time: 08/28/2022 4:37 PM  Performed by: Marita Kansas, PA-C Authorized by: Marita Kansas, PA-C   Consent:    Consent obtained:  Verbal   Consent given by:  Patient   Risks discussed:  Infection, need for additional repair, pain, poor cosmetic result and poor wound healing   Alternatives discussed:  No treatment and delayed treatment Universal protocol:    Procedure explained and questions answered to patient or proxy's satisfaction: yes     Relevant documents present and verified: yes     Patient identity confirmed:  Verbally with patient Anesthesia:    Anesthesia method:  Topical application   Topical anesthetic:  LET Laceration details:    Location:  Lip   Lip location:  Upper interior lip   Length (cm):  1 Pre-procedure details:    Preparation:  Patient was prepped and draped in usual sterile fashion Treatment:    Area cleansed with:  Povidone-iodine and saline   Amount of cleaning:  Standard   Irrigation solution:  Sterile saline   Irrigation volume:  250   Debridement:  None   Undermining:  None Skin repair:    Repair method:  Sutures   Suture size:  5-0   Suture material:  Fast-absorbing gut   Suture technique:  Simple interrupted   Number of sutures:  2 Approximation:    Approximation:  Close Repair type:    Repair type:  Simple Post-procedure details:    Dressing:  Open (no dressing)   Procedure completion:  Tolerated well, no  immediate complications     Medications Ordered in ED Medications  lidocaine-EPINEPHrine-tetracaine (LET) topical gel (has no administration in time range)    ED Course/ Medical Decision Making/ A&P                           Medical Decision Making  Medical Decision Making / ED Course   This patient presents to the ED for concern of fall, this involves an extensive number of treatment options, and is a complaint that carries with it a high risk of complications and morbidity.  The differential diagnosis includes lip laceration, facial bone fracture, acute intracranial hemorrhage, cervical  spine fracture  MDM: 66 year old female presents today for evaluation of fall, and laceration internal upper lip.  Denies any other injuries.  Teeth are intact without laxity.  There is some tenderness to palpation present over the right maxillary bone.  I discussed this with the patient, and her daughter and offered CT maxillofacial scan.  They both deferred.  There is no overlying bruising or laxity.  They will return for follow-up with PCP if pain worsens.  Laceration repaired with two 5-0 absorbable sutures.  Patient is appropriate for discharge.  Discharged in stable condition.  Return precautions discussed.   Lab Tests: -I ordered, reviewed, and interpreted labs.   The pertinent results include:   Labs Reviewed - No data to display    EKG  EKG Interpretation  Date/Time:    Ventricular Rate:    PR Interval:    QRS Duration:   QT Interval:    QTC Calculation:   R Axis:     Text Interpretation:          Medicines ordered and prescription drug management: Meds ordered this encounter  Medications   lidocaine-EPINEPHrine-tetracaine (LET) topical gel   acetaminophen (TYLENOL) tablet 650 mg    -I have reviewed the patients home medicines and have made adjustments as needed   Co morbidities that complicate the patient evaluation  Past Medical History:  Diagnosis Date   Abnormal  EKG 11/09/2012   Arthritis    Delirium, drug-induced 11/10/2012   Depression    Educated about COVID-19 virus infection 04/24/2019   Eustachian tube dysfunction, bilateral 01/19/2019   Gastritis 04/28/2022   Hypertension    Referred otalgia of both ears 01/19/2019   Suicidal behavior 11/09/2012      Dispostion: Patient is appropriate for discharge.  Discharged in stable condition.  Return precautions discussed.     Final Clinical Impression(s) / ED Diagnoses Final diagnoses:  Accidental fall, initial encounter  Lip laceration, initial encounter    Rx / DC Orders ED Discharge Orders     None         Marita Kansas, PA-C 08/28/22 1639    Jacalyn Lefevre, MD 08/28/22 1744

## 2022-08-28 NOTE — ED Triage Notes (Signed)
Patient states she tripped on her own feet today and landed on a wooden floor with her face. Patient has blood to the inside of the lower lip and states she bit her lip. Patient denies LOC, blood thinners, blurred vision, N/v.

## 2022-09-21 ENCOUNTER — Encounter: Payer: Self-pay | Admitting: *Deleted

## 2022-12-10 ENCOUNTER — Encounter: Payer: Self-pay | Admitting: *Deleted

## 2022-12-22 ENCOUNTER — Other Ambulatory Visit: Payer: Self-pay | Admitting: Cardiology

## 2022-12-29 ENCOUNTER — Other Ambulatory Visit: Payer: Self-pay | Admitting: Cardiology

## 2022-12-31 NOTE — Telephone Encounter (Signed)
Left message for pt to call.

## 2023-01-05 ENCOUNTER — Encounter: Payer: Self-pay | Admitting: Cardiology

## 2023-01-06 NOTE — Telephone Encounter (Signed)
Left message for pt to call.

## 2023-01-11 ENCOUNTER — Encounter: Payer: Self-pay | Admitting: *Deleted

## 2023-02-24 ENCOUNTER — Ambulatory Visit
Admission: EM | Admit: 2023-02-24 | Discharge: 2023-02-24 | Disposition: A | Discharge status: Home / Self Care | Payer: Medicare Other | Attending: Nurse Practitioner | Admitting: Nurse Practitioner

## 2023-02-24 ENCOUNTER — Ambulatory Visit (INDEPENDENT_AMBULATORY_CARE_PROVIDER_SITE_OTHER): Payer: Medicare Other

## 2023-02-24 DIAGNOSIS — M25532 Pain in left wrist: Secondary | ICD-10-CM

## 2023-02-24 DIAGNOSIS — M79642 Pain in left hand: Secondary | ICD-10-CM

## 2023-02-24 MED ORDER — NAPROXEN 375 MG PO TABS: 375.0000 mg | ORAL_TABLET | Freq: Two times a day (BID) | ORAL | 0 refills | Status: AC

## 2023-02-24 NOTE — ED Provider Notes (Signed)
[facility]    CSN: [id number] Arrival date & time: 02/24/23  1353      History   Chief Complaint Chief Complaint  Patient presents with   Hand Pain    HPI Jaclyn [name] is a 67 y.o. female presents for evaluation of hand and wrist pain.  Patient is accompanied by family member.  Patient reports 2 weeks of pain to the underside of her left wrist as well as pain to the dorsum of her left hand.  States the pain is intermittent.  Denies any known injury or known inciting event.  Denies swelling or bruising.  No numbness or tingling.  States it hurts the most when she tries to grip or lift anything.  No history of surgeries or injuries to the hand or wrist in the past.  She has been taking Tylenol with some improvement.  No other concerns at this time.   Hand Pain    Past Medical History:  Diagnosis Date   Abnormal EKG 11/09/2012   Arthritis    Delirium, drug-induced 11/10/2012   Depression    Educated about COVID-19 virus infection 04/24/2019   Eustachian tube dysfunction, bilateral 01/19/2019   Gastritis 04/28/2022   Hypertension    Referred otalgia of both ears 01/19/2019   Suicidal behavior 11/09/2012    Patient Active Problem List   Diagnosis Date Noted   Hyperlipidemia associated with type 2 diabetes mellitus ([place]) 04/28/2022   Hyperglycemia due to type 2 diabetes mellitus ([place]) 04/28/2022   Bilateral hearing loss 07/15/2020   Ear pressure, bilateral 07/15/2020   Temporomandibular joint (TMJ) pain 07/15/2020   Chronic pansinusitis 01/19/2019   Laryngopharyngeal reflux (LPR) 01/19/2019   Perennial allergic rhinitis 01/19/2019   Adjustment disorder with mixed anxiety and depressed mood 04/15/2018   GAD (generalized anxiety disorder) 10/22/2017   Bipolar depression ([place]) 07/07/2013   Drug overdose, multiple drugs 11/09/2012   DM type 2 (diabetes mellitus, type 2) ([place]) 11/09/2012   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      Home Medications    Prior to Admission medications   Medication Sig Start Date End Date Taking? Authorizing Provider  naproxen (NAPROSYN) 375 MG tablet Take 1 tablet (375 mg total) by mouth 2 (two) times daily for 5 days. 02/24/23 03/01/23 Yes Melynda Ripple, NP  acetaminophen (TYLENOL) 325 MG tablet Take 2 tablets (650 mg total) by mouth every 6 (six) hours as needed for moderate pain. 07/28/22   Jaynee Eagles, PA-C  busPIRone (BUSPAR) 10 MG tablet Take 1 tablet (10 mg total) by mouth 3 (three) times daily. 03/11/21   Ival Bible, MD  glipiZIDE (GLUCOTROL) 10 MG tablet Take 10 mg by mouth every morning. 12/07/21   [provider]  glucose blood test strip Use to check blood sugar twice daily Patient taking differently: 1 each by Other route as needed (blood sugar). One touch verio--Use to check blood sugar twice daily 02/27/15   Dutch Quint B, FNP  Insulin Detemir (LEVEMIR FLEXTOUCH) 100 UNIT/ML Pen Inject 28 Units into the skin daily at 10 pm. Patient taking differently: Inject 30 Units into the skin 2 (two) times daily. 07/04/15   Lucretia Kern, DO  Insulin Pen Needle (BD PEN NEEDLE NANO U/F) 32G X 4  MM MISC Test daily. 09/11/14   Zenaida Niece, PA-C  metFORMIN (GLUCOPHAGE) 1000 MG tablet TAKE ONE TABLET BY MOUTH TWICE DAILY WITH  A  MEAL Patient taking differently: Take 1,000 mg by mouth 2 (two) times daily with a meal. 04/06/15   Lucretia Kern, DO  Multiple Vitamin (MULTIVITAMIN WITH MINERALS) TABS tablet Take 1 tablet by mouth daily.    [provider]  Olmesartan-amLODIPine-HCTZ 40-10-25 MG TABS Take 1 tablet by mouth once daily 01/11/23   Minus Breeding, MD  omeprazole (PRILOSEC) 40 MG capsule Take 40 mg by mouth daily. 01/01/20   [provider]  risperiDONE (RISPERDAL) 0.5 MG tablet TAKE ONE TABLET BY MOUTH TWICE  DAILY FOR MOOD  CONTROL. Patient taking differently: Take 0.5-1 mg by mouth See admin instructions. 0.'5mg'$  in AM and '1mg'$  in evening 05/24/15   Dutch Quint B, FNP  rosuvastatin (CRESTOR) 10 MG tablet 1 tablet 12/09/21   [provider]    Family History Family History  Problem Relation Age of Onset   Huntington's disease Father     Social History Social History   Tobacco Use   Smoking status: Never   Smokeless tobacco: Never  Vaping Use   Vaping Use: Never used  Substance Use Topics   Alcohol use: No   Drug use: No     Allergies   Codeine, Flagyl [metronidazole], Metronidazole benzo+syrspend, Morphine and related, Remeron [mirtazapine], and Duloxetine hcl   Review of Systems Review of Systems  Musculoskeletal:        Left hand and wrist pain      Physical Exam Triage Vital Signs ED Triage Vitals  Enc Vitals Group     BP 02/24/23 1402 129/73     Pulse Rate 02/24/23 1402 83     Resp 02/24/23 1402 16     Temp 02/24/23 1402 98.4 F (36.9 C)     Temp Source 02/24/23 1402 Oral     SpO2 02/24/23 1402 97 %     Weight --      Height --      Head Circumference --      Peak Flow --      Pain Score 02/24/23 1403 9     Pain Loc --      Pain Edu? --      Excl. in [place]? --    No data found.  Updated Vital Signs BP 129/73 (BP Location: Left Arm)   Pulse 83   Temp 98.4 F (36.9 C) (Oral)   Resp 16   SpO2 97%   Visual Acuity Right Eye Distance:   Left Eye Distance:   Bilateral Distance:    Right Eye Near:   Left Eye Near:    Bilateral Near:     Physical Exam Vitals and nursing note reviewed.  Constitutional:      Appearance: Normal appearance.  HENT:     Head: Normocephalic and atraumatic.  Eyes:     Pupils: Pupils are equal, round, and reactive to light.  Cardiovascular:     Rate and Rhythm: Normal rate.  Pulmonary:     Effort: Pulmonary effort is normal.  Musculoskeletal:       Hands:     Comments: There is no swelling, ecchymosis,  erythema of the left hand or wrist.  Tender to palpation to the dorsum of the left hand overlying second and third metacarpals as well as tenderness to palpation on the volar aspect of the left lateral wrist.  Cap refill +  2.  Strength 5 out of 5 right, 4 out of 5 left.  Skin:    General: Skin is warm and dry.  Neurological:     General: No focal deficit present.     Mental Status: She is alert and oriented to person, place, and time.  Psychiatric:        Mood and Affect: Mood normal.        Behavior: Behavior normal.      UC Treatments / Results  Labs (all labs ordered are listed, but only abnormal results are displayed) Labs Reviewed - No data to display  EKG   Radiology DG Wrist Complete Left  Result Date: 02/24/2023 CLINICAL DATA:  Left hand pain for 2 weeks. EXAM: LEFT WRIST - COMPLETE 3+ VIEW COMPARISON:  None Available. FINDINGS: No acute osseous or joint abnormality. IMPRESSION: No acute osseous or joint abnormality. Electronically Signed   By: Lorin Picket M.D.   On: 02/24/2023 14:41   DG Hand Complete Left  Result Date: 02/24/2023 CLINICAL DATA:  Left hand pain for 2 weeks. EXAM: LEFT HAND - COMPLETE 3+ VIEW COMPARISON:  None Available. FINDINGS: No acute osseous or joint abnormality. IMPRESSION: No acute osseous or joint abnormality. Electronically Signed   By: Lorin Picket M.D.   On: 02/24/2023 14:40    Procedures Procedures (including critical care time)  Medications Ordered in UC Medications - No data to display  Initial Impression / Assessment and Plan / UC Course  I have reviewed the triage vital signs and the nursing notes.  Pertinent labs & imaging results that were available during my care of the patient were reviewed by me and considered in my medical decision making (see chart for details).     Reviewed exam and symptoms with patient and daughter.  No red flags Unclear cause of hand or wrist pain.  Discussed possible strain versus arthritis Trial  of naproxen twice daily for 5 days May continue Tylenol as needed Advised PCP follow-up if symptoms or not improving ER precautions reviewed and patient verbalized understanding Final Clinical Impressions(s) / UC Diagnoses   Final diagnoses:  Left hand pain  Left wrist pain     Discharge Instructions      Naproxen twice daily for 5 days May continue Tylenol as needed Follow-up with your PCP if symptoms or not improving Please go to the emergency room if you have any worsening symptoms     ED Prescriptions     Medication Sig Dispense Auth. Provider   naproxen (NAPROSYN) 375 MG tablet Take 1 tablet (375 mg total) by mouth 2 (two) times daily for 5 days. 10 tablet Melynda Ripple, NP      PDMP not reviewed this encounter.   Melynda Ripple, NP 02/24/23 [phone]

## 2023-02-24 NOTE — Discharge Instructions (Signed)
Naproxen twice daily for 5 days May continue Tylenol as needed Follow-up with your PCP if symptoms or not improving Please go to the emergency room if you have any worsening symptoms

## 2023-02-24 NOTE — ED Triage Notes (Signed)
Pt c/o left hand pain for 2 weeks. The pt does not recall hitting her hand.   Home interventions: tylenol, advil

## 2023-03-01 ENCOUNTER — Encounter: Payer: Self-pay | Admitting: Family

## 2023-03-09 ENCOUNTER — Ambulatory Visit: Payer: Medicare Other | Admitting: Cardiology

## 2023-03-10 ENCOUNTER — Ambulatory Visit (INDEPENDENT_AMBULATORY_CARE_PROVIDER_SITE_OTHER): Payer: Medicare Other | Admitting: Family

## 2023-03-10 ENCOUNTER — Encounter: Payer: Self-pay | Admitting: Family

## 2023-03-10 VITALS — BP 149/83 | HR 78 | Temp 97.3°F | Ht 63.0 in | Wt 119.2 lb

## 2023-03-10 DIAGNOSIS — M79642 Pain in left hand: Secondary | ICD-10-CM

## 2023-03-10 MED ORDER — PREDNISONE 10 MG PO TABS: ORAL_TABLET | ORAL | 0 refills | Status: DC

## 2023-03-10 NOTE — Patient Instructions (Signed)
It was very nice to see you today!   I have sent over a steroid pain med to help your hand pain.  As it starts feeling better, exercise your hand using a soft foam ball, gently squeezing daily to keep from getting stiff. You can look for Turmeric w/Curcumin is a natural herbal anti-inflammatory you can take 2-3 times per day to help with pain.      PLEASE NOTE:  If you had any lab tests please let us know if you have not heard back within a few days. You may see your results on MyChart before we have a chance to review them but we will give you a call once they are reviewed by [place]. If we ordered any referrals today, please let us know if you have not heard from their office within the next week.

## 2023-03-10 NOTE — Progress Notes (Signed)
Patient ID: Jaclyn [name], female    DOB: [birth year], 67 y.o.   MRN: [id number]  Chief Complaint  Patient presents with   Hand Pain    Pt c/o pain in left hand, UC gave her naproxen which did not help.    *Patient is accompanied by their daughter today, who is helping to provide HPI/medical information.  HPI:      Left hand/wrist pain:   started hurting about a month ago, has hx of arthritis and used to take Mobic, had in her back when she was working. Pt denies any neuropathy sx, uses her right hand for writing, most everything. Denies any injury. Was seen in UC a few weeks ago, xray wnl,  and given Naproxen but she states it has not helped at all.  Assessment & Plan:  1. Left hand pain - c/o stiffness, unable to close fingers into a fist, states it is painful. Sending prednisone pack, advised pt & dtr on use & SE. Advised when feeling better, practice closing hand over a foam ball to keep from stiffening. Advised on OTC Turmeric to take prn for inflammation. If not resolving will sent to sports med.  - predniSONE (DELTASONE) 10 MG tablet; 5 tab day 1, 4 tab day 2-3, 3 tab day 4, 2 tab day 5  Dispense: 18 tablet; Refill: 0  Subjective:    Outpatient Medications Prior to Visit  Medication Sig Dispense Refill   acetaminophen (TYLENOL) 325 MG tablet Take 2 tablets (650 mg total) by mouth every 6 (six) hours as needed for moderate pain. 30 tablet 0   busPIRone (BUSPAR) 10 MG tablet Take 1 tablet (10 mg total) by mouth 3 (three) times daily.     glipiZIDE (GLUCOTROL) 10 MG tablet Take 10 mg by mouth every morning.     glucose blood test strip Use to check blood sugar twice daily (Patient taking differently: 1 each by Other route as needed (blood sugar). One touch verio--Use to check blood sugar twice daily) 100 each 5   Insulin Detemir (LEVEMIR FLEXTOUCH) 100 UNIT/ML Pen Inject 28 Units into the skin daily at 10 pm. (Patient taking differently: Inject 30 Units into the skin 2 (two) times  daily.) 9 mL 0   Insulin Pen Needle (BD PEN NEEDLE NANO U/F) 32G X 4 MM MISC Test daily. 100 each 5   metFORMIN (GLUCOPHAGE) 1000 MG tablet TAKE ONE TABLET BY MOUTH TWICE DAILY WITH  A  MEAL (Patient taking differently: Take 1,000 mg by mouth 2 (two) times daily with a meal.) 180 tablet 3   Multiple Vitamin (MULTIVITAMIN WITH MINERALS) TABS tablet Take 1 tablet by mouth daily.     Olmesartan-amLODIPine-HCTZ 40-10-25 MG TABS Take 1 tablet by mouth once daily 90 tablet 0   omeprazole (PRILOSEC) 40 MG capsule Take 40 mg by mouth daily.     risperiDONE (RISPERDAL) 0.5 MG tablet TAKE ONE TABLET BY MOUTH TWICE DAILY FOR MOOD  CONTROL. (Patient taking differently: Take 0.5-1 mg by mouth See admin instructions. 0.'5mg'$  in AM and '1mg'$  in evening) 180 tablet 0   rosuvastatin (CRESTOR) 10 MG tablet 1 tablet     No facility-administered medications prior to visit.   Past Medical History:  Diagnosis Date   Abnormal EKG 11/09/2012   Arthritis    Delirium, drug-induced 11/10/2012   Depression    Ear pressure, bilateral 07/15/2020   Educated about COVID-19 virus infection 04/24/2019   Eustachian tube dysfunction, bilateral 01/19/2019   Gastritis 04/28/2022  Hypertension    Referred otalgia of both ears 01/19/2019   Suicidal behavior 11/09/2012   Past Surgical History:  Procedure Laterality Date   NASAL SEPTUM SURGERY     TUBAL LIGATION     Allergies  Allergen Reactions   Codeine Nausea And Vomiting    States that she cannot take in high doses   Flagyl [Metronidazole] Other (See Comments)    Fast heartbeat,tingly   Metronidazole Benzo+Syrspend    Morphine And Related Nausea And Vomiting    States that she cannot take in high doses   Remeron [Mirtazapine] Other (See Comments)    Severe restlessness,irritable,hyperactivity,agitation   Duloxetine Hcl Rash      Objective:    Physical Exam Vitals and nursing note reviewed.  Constitutional:      Appearance: Normal appearance.   Cardiovascular:     Rate and Rhythm: Normal rate and regular rhythm.  Pulmonary:     Effort: Pulmonary effort is normal.     Breath sounds: Normal breath sounds.  Musculoskeletal:     Right hand: Normal.     Left hand: Swelling (mild in left wrist) and tenderness present. No bony tenderness. Decreased range of motion. Decreased strength of finger abduction.  Skin:    General: Skin is warm and dry.  Neurological:     Mental Status: She is alert.  Psychiatric:        Mood and Affect: Mood normal.        Behavior: Behavior normal.    BP (!) 149/83 (BP Location: Left Arm, Patient Position: Sitting, Cuff Size: Large)   Pulse 78   Temp (!) 97.3 F (36.3 C) (Temporal)   Ht '5\' 3"'$  (1.6 m)   Wt 119 lb 4 oz (54.1 kg)   SpO2 98%   BMI 21.12 kg/m  Wt Readings from Last 3 Encounters:  03/10/23 119 lb 4 oz (54.1 kg)  08/28/22 120 lb (54.4 kg)  04/28/22 121 lb 4 oz (55 kg)      Jeanie Sewer, NP

## 2023-03-16 ENCOUNTER — Encounter: Payer: Self-pay | Admitting: Family

## 2023-03-16 DIAGNOSIS — M79642 Pain in left hand: Secondary | ICD-10-CM

## 2023-03-17 ENCOUNTER — Telehealth: Payer: Self-pay | Admitting: Family

## 2023-03-17 NOTE — Telephone Encounter (Signed)
please send referral to sports med office - [place], & let [place] know, thx

## 2023-03-17 NOTE — Telephone Encounter (Signed)
Copied from [place] [id number]. Topic: Medicare AWV >> Mar 17, 2023 12:17 PM Gillis Santa wrote: Reason for CRM: Called patient to schedule Medicare Annual Wellness Visit (AWV). Left message for patient to call back and schedule Medicare Annual Wellness Visit (AWV).  Last date of AWV: N/A  Please schedule an appointment at any time with Otila Kluver, [facility].  Please schedule AWVI with Otila Kluver, [place].  If any questions, please contact me at [phone].  Thank you ,  Shaune Pollack [facility] AWV TEAM Direct Dial [phone]

## 2023-03-30 ENCOUNTER — Ambulatory Visit: Payer: Medicare Other | Admitting: Sports Medicine

## 2023-03-30 ENCOUNTER — Encounter: Payer: Self-pay | Admitting: Sports Medicine

## 2023-03-30 DIAGNOSIS — R937 Abnormal findings on diagnostic imaging of other parts of musculoskeletal system: Secondary | ICD-10-CM

## 2023-03-30 DIAGNOSIS — Z794 Long term (current) use of insulin: Secondary | ICD-10-CM | POA: Diagnosis not present

## 2023-03-30 DIAGNOSIS — E1165 Type 2 diabetes mellitus with hyperglycemia: Secondary | ICD-10-CM | POA: Diagnosis not present

## 2023-03-30 DIAGNOSIS — M25432 Effusion, left wrist: Secondary | ICD-10-CM | POA: Diagnosis not present

## 2023-03-30 DIAGNOSIS — M720 Palmar fascial fibromatosis [Dupuytren]: Secondary | ICD-10-CM

## 2023-03-30 DIAGNOSIS — M79642 Pain in left hand: Secondary | ICD-10-CM

## 2023-03-30 MED ORDER — MELOXICAM 15 MG PO TABS: 15.0000 mg | ORAL_TABLET | Freq: Every day | ORAL | 0 refills | Status: AC

## 2023-03-30 NOTE — Progress Notes (Signed)
Jaclyn [name] - 67 y.o. female MRN [id number]  Date of birth: 1956-12-14  Office Visit Note: Visit Date: 03/30/2023 PCP: Jeanie Sewer, NP Referred by: Jeanie Sewer, NP  Subjective: Chief Complaint  Patient presents with   Left Hand - Pain   HPI: Jaclyn [name] is a pleasant 67 y.o. female who presents today for left hand and wrist pain x 6 months.  Jaclyn [name] and her daughter are present during the visit today. Jaclyn [name] reports left wrist and hand pain for about 6 months, however over the last month or so it has worsened.  She denies any injury or inciting event.  She is right-hand dominant.  She has been seen by her primary care physician in the ED and has received a trial of oral prednisone as well as 2 weeks of naproxen without any relief.  Of her pain.  She also takes Tylenol without much relief.  She has a hard time describing exactly where her pain is but points mostly to the dorsum of the hand and the radial aspect of the wrist.  Per the daughter, there is no personal or family history of autoimmune arthropathy, rheumatoid arthritis, lupus.  Type II DM: Lab Results  Component Value Date   HGBA1C 7.7 (H) 03/09/2021  - on Metformin 100mg  BID - Levemir 28u - Glipizide 10mg   Took prednisone 5 day taper (50-->10mg ) on 03/10/23 - no relief  Pertinent ROS were reviewed with the patient and found to be negative unless otherwise specified above in HPI.   Assessment & Plan: Visit Diagnoses:  1. Pain in left hand   2. Dupuytren's contracture of left hand   3. Wrist swelling, left   4. X-ray evidence of poor bone mineralization    Plan: Discussed with Jaclyn [name] the possible etiology of her hand and wrist pain.  I do not appreciate any evidence of fracture or acute bony abnormalities, however she does have evidence of decreased bone mineral density.  I will send a message to her PCP, and may consider DEXA for further referral for this.  I discussed with Jaclyn [name] I am somewhat perplexed  regarding the origin of her pain.  I would like to rule out inflammatory process, labs for ESR, CRP, CBC and ANA were drawn today.  I will call her with these results.  She does have difficulty fully closing the hands and does have early Dupuytren's contracture of the palm.  X-rays do show some arthritic change and instability about the [facility] joint, all of these could be contributing.  We discussed all treatment options such as obtaining MRI versus having her see a hand surgeon.  She would like to see a hand surgeon which I feel is reasonable, will refer her to Dr. Tempie Donning. To help with pain control, will begin her on Meloxicam 15mg  qd.   Follow-up: will call with lab results; referral send today to Dr. Tempie Donning (hand orthopedic)  Meds & Orders:  Meds ordered this encounter  Medications   meloxicam (MOBIC) 15 MG tablet    Sig: Take 1 tablet (15 mg total) by mouth daily.    Dispense:  30 tablet    Refill:  0    Orders Placed This Encounter  Procedures   Sed Rate (ESR)   C-reactive protein   Antinuclear Antib (ANA)   CBC with Differential   Ambulatory referral to Orthopedic Surgery     Procedures: No procedures performed      Clinical History: No specialty comments available.  She reports  that she has never smoked. She has never used smokeless tobacco. No results for input(s): "HGBA1C", "LABURIC" in the last 8760 hours.  Objective:    Physical Exam  Gen: Well-appearing, in no acute distress; non-toxic CV: Well-perfused. Warm.  Resp: Breathing unlabored on room air; no wheezing. Psych: Speech is short; slowed thought process and response Neuro: Sensation intact throughout. No gross coordination deficits.   Ortho Exam - Left hand/wrist: There is no swelling or wrist effusion.  There is generalized tenderness to palpation over the dorsum of the metacarpal second and third, mild TTP at the [facility] joint as well as the volar aspect of the radial wrist.  There is pain with flexion and  extension of the wrist, but fluid range of motion.  There is some mild instability with UCL testing, likely chronic.  Equivocal CMC grind test.  Patient has pain and difficulty performing closed fist grip.  There is some palmar thickening, indicative of early Dupuytren's contracture.  Imaging:  *Independent review of 3 views of the left wrist and left hand from 02/24/2023 was interpreted and reviewed by myself.  X-rays demonstrate decreased bone mineral density noted throughout, indicative of osteopenia versus osteoporosis.  There is neutral ulnar variance.  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  I cannot appreciate any acute fracture of the hand or wrist on my views.  DG Wrist Complete Left CLINICAL DATA:  Left hand pain for 2 weeks.  EXAM: LEFT WRIST - COMPLETE 3+ VIEW  COMPARISON:  None Available.  FINDINGS: No acute osseous or joint abnormality.  IMPRESSION: No acute osseous or joint abnormality.  Electronically Signed   By: Lorin Picket M.D.   On: 02/24/2023 14:41 DG Hand Complete Left CLINICAL DATA:  Left hand pain for 2 weeks.  EXAM: LEFT HAND - COMPLETE 3+ VIEW  COMPARISON:  None Available.  FINDINGS: No acute osseous or joint abnormality.  IMPRESSION: No acute osseous or joint abnormality.  Electronically Signed   By: Lorin Picket M.D.   On: 02/24/2023 14:40    Past Medical/Family/Surgical/Social History: Medications & Allergies reviewed per EMR, new medications updated. Patient Active Problem List   Diagnosis Date Noted   Hyperlipidemia associated with type 2 diabetes mellitus 04/28/2022   Hyperglycemia due to type 2 diabetes mellitus 04/28/2022   Bilateral hearing loss 07/15/2020   Temporomandibular joint (TMJ) pain 07/15/2020   Chronic  pansinusitis 01/19/2019   Laryngopharyngeal reflux (LPR) 01/19/2019   Perennial allergic rhinitis 01/19/2019   Adjustment disorder with mixed anxiety and depressed mood 04/15/2018   GAD (generalized anxiety disorder) 10/22/2017   Bipolar depression 07/07/2013   Drug overdose, multiple drugs 11/09/2012   DM type 2 (diabetes mellitus, type 2) 11/09/2012   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 11/09/2012   Hypertension 11/09/2012   Past Medical History:  Diagnosis Date   Abnormal EKG 11/09/2012   Arthritis    Delirium, drug-induced 11/10/2012   Depression    Ear pressure, bilateral 07/15/2020   Educated about COVID-19 virus infection 04/24/2019   Eustachian tube dysfunction, bilateral 01/19/2019   Gastritis 04/28/2022   Hypertension    Referred otalgia of both ears 01/19/2019   Suicidal behavior 11/09/2012   Family History  Problem Relation Age of Onset   Huntington's disease Father    Past Surgical History:  Procedure Laterality Date  NASAL SEPTUM SURGERY     TUBAL LIGATION     Social History   Occupational History   Not on file  Tobacco Use   Smoking status: Never   Smokeless tobacco: Never  Vaping Use   Vaping Use: Never used  Substance and Sexual Activity   Alcohol use: No   Drug use: No   Sexual activity: Not Currently

## 2023-03-30 NOTE — Progress Notes (Signed)
1 month + pain No injury States there is swelling No numbness/tingling Xrays in [facility]

## 2023-04-01 ENCOUNTER — Telehealth: Payer: Self-pay | Admitting: Family

## 2023-04-01 NOTE — Telephone Encounter (Signed)
Copied from [facility] [id number]. Topic: Medicare AWV >> Apr 01, 2023 11:26 AM Gillis Santa wrote: Reason for CRM: Called patient to schedule Medicare Annual Wellness Visit (AWV). Left message for patient to call back and schedule Medicare Annual Wellness Visit (AWV).  Last date of AWV: N/A  Please schedule an appointment at any time with Otila Kluver, [facility]. Please schedule AWVS with Otila Kluver, [place].  If any questions, please contact me at [phone].  Thank you ,  [facility] AWV TEAM Direct Dial [phone]

## 2023-04-02 LAB — CBC WITH DIFFERENTIAL/PLATELET
Absolute Monocytes: 443 cells/uL (ref 200–950)
Basophils Absolute: 57 cells/uL (ref 0–200)
Basophils Relative: 0.7 %
Eosinophils Absolute: 566 cells/uL — ABNORMAL HIGH (ref 15–500)
Eosinophils Relative: 6.9 %
HCT: 34.5 % — ABNORMAL LOW (ref 35.0–45.0)
Hemoglobin: 10.5 g/dL — ABNORMAL LOW (ref 11.7–15.5)
Lymphs Abs: 2017.2 cells/uL (ref 850–3900)
MCH: 27.3 pg (ref 27.0–33.0)
MCHC: 30.4 g/dL — ABNORMAL LOW (ref 32.0–36.0)
MCV: 89.8 fL (ref 80.0–100.0)
MPV: 10.7 fL (ref 7.5–12.5)
Monocytes Relative: 5.4 %
Neutro Abs: 5117 cells/uL (ref 1500–7800)
Neutrophils Relative %: 62.4 %
Platelets: 232 10*3/uL (ref 140–400)
RBC: 3.84 10*6/uL (ref 3.80–5.10)
RDW: 13.7 % (ref 11.0–15.0)
Total Lymphocyte: 24.6 %
WBC: 8.2 10*3/uL (ref 3.8–10.8)

## 2023-04-02 LAB — ANA: Anti Nuclear Antibody (ANA): NEGATIVE

## 2023-04-02 LAB — SEDIMENTATION RATE: Sed Rate: 2 mm/h (ref 0–30)

## 2023-04-02 LAB — C-REACTIVE PROTEIN: CRP: 0.6 mg/L (ref <8.0)

## 2023-04-04 ENCOUNTER — Other Ambulatory Visit: Payer: Self-pay | Admitting: Cardiology

## 2023-04-06 ENCOUNTER — Other Ambulatory Visit: Payer: Self-pay

## 2023-04-06 MED ORDER — OLMESARTAN-AMLODIPINE-HCTZ 40-10-25 MG PO TABS: 1.0000 | ORAL_TABLET | Freq: Every day | ORAL | 0 refills | Status: AC

## 2023-04-13 ENCOUNTER — Encounter: Payer: Self-pay | Admitting: Family

## 2023-04-13 ENCOUNTER — Ambulatory Visit (INDEPENDENT_AMBULATORY_CARE_PROVIDER_SITE_OTHER): Payer: Medicare Other | Admitting: Family

## 2023-04-13 VITALS — BP 156/76 | HR 69 | Temp 97.7°F | Ht 63.0 in | Wt 119.0 lb

## 2023-04-13 DIAGNOSIS — Z022 Encounter for examination for admission to residential institution: Secondary | ICD-10-CM | POA: Diagnosis not present

## 2023-04-13 NOTE — Progress Notes (Signed)
Patient ID: Jaclyn [name], female    DOB: 1956/04/25, 67 y.o.   MRN: [id number]  Chief Complaint  Patient presents with   Assisted Living    FL2 Form    HPI: FL2 form completion:  pt here with daughter, have been considering ALF for pt for about a year, now dtr has a facility in mind she would like to tour and needs FL2 completed and signed.  Assessment & Plan:  Encounter for examination for admission to assisted living facility - Pt needing more care with bathing & dressing, requires some assistance with ambulating. FL2 form completed and signed, advised pt and dtr make copies for personal records and for other facilities if needed. We will scan copy to chart.  Subjective:    Outpatient Medications Prior to Visit  Medication Sig Dispense Refill   acetaminophen (TYLENOL) 325 MG tablet Take 2 tablets (650 mg total) by mouth every 6 (six) hours as needed for moderate pain. 30 tablet 0   busPIRone (BUSPAR) 10 MG tablet Take 1 tablet (10 mg total) by mouth 3 (three) times daily.     glipiZIDE (GLUCOTROL) 10 MG tablet Take 10 mg by mouth every morning.     glucose blood test strip Use to check blood sugar twice daily (Patient taking differently: 1 each by Other route as needed (blood sugar). One touch verio--Use to check blood sugar twice daily) 100 each 5   Insulin Detemir (LEVEMIR FLEXTOUCH) 100 UNIT/ML Pen Inject 28 Units into the skin daily at 10 pm. (Patient taking differently: Inject 30 Units into the skin 2 (two) times daily.) 9 mL 0   Insulin Pen Needle (BD PEN NEEDLE NANO U/F) 32G X 4 MM MISC Test daily. 100 each 5   meloxicam (MOBIC) 15 MG tablet Take 1 tablet (15 mg total) by mouth daily. 30 tablet 0   metFORMIN (GLUCOPHAGE) 1000 MG tablet TAKE ONE TABLET BY MOUTH TWICE DAILY WITH  A  MEAL (Patient taking differently: Take 1,000 mg by mouth 2 (two) times daily with a meal.) 180 tablet 3   Multiple Vitamin (MULTIVITAMIN WITH MINERALS) TABS tablet Take 1 tablet by mouth daily.      Olmesartan-amLODIPine-HCTZ 40-10-25 MG TABS Take 1 tablet by mouth daily. 90 tablet 0   omeprazole (PRILOSEC) 40 MG capsule Take 40 mg by mouth daily.     risperiDONE (RISPERDAL) 0.5 MG tablet TAKE ONE TABLET BY MOUTH TWICE DAILY FOR MOOD  CONTROL. (Patient taking differently: Take 0.5-1 mg by mouth See admin instructions. 0.5mg  in AM and  in evening) 180 tablet 0   rosuvastatin (CRESTOR) 10 MG tablet 1 tablet     predniSONE (DELTASONE) 10 MG tablet 5 tab day 1, 4 tab day 2-3, 3 tab day 4, 2 tab day 5 18 tablet 0   No facility-administered medications prior to visit.   Past Medical History:  Diagnosis Date   Abnormal EKG 11/09/2012   Arthritis    Delirium, drug-induced 11/10/2012   Depression    Ear pressure, bilateral 07/15/2020   Educated about COVID-19 virus infection 04/24/2019   Eustachian tube dysfunction, bilateral 01/19/2019   Gastritis 04/28/2022   Hypertension    Referred otalgia of both ears 01/19/2019   Suicidal behavior 11/09/2012   Past Surgical History:  Procedure Laterality Date   NASAL SEPTUM SURGERY     TUBAL LIGATION     Allergies  Allergen Reactions   Codeine Nausea And Vomiting    States that she cannot take in high doses  Flagyl [Metronidazole] Other (See Comments)    Fast heartbeat,tingly   Metronidazole Benzo+Syrspend    Morphine And Related Nausea And Vomiting    States that she cannot take in high doses   Remeron [Mirtazapine] Other (See Comments)    Severe restlessness,irritable,hyperactivity,agitation   Duloxetine Hcl Rash      Objective:    Physical Exam Vitals and nursing note reviewed.  Constitutional:      Appearance: Normal appearance.  Cardiovascular:     Rate and Rhythm: Normal rate and regular rhythm.  Pulmonary:     Effort: Pulmonary effort is normal.     Breath sounds: Normal breath sounds.  Musculoskeletal:        General: Normal range of motion.  Skin:    General: Skin is warm and dry.  Neurological:     Mental  Status: She is alert.  Psychiatric:        Mood and Affect: Mood normal.        Behavior: Behavior normal.    BP (!) 156/76 (BP Location: Left Arm, Patient Position: Sitting, Cuff Size: Large)   Pulse 69   Temp 97.7 F (36.5 C) (Temporal)   Ht  (1.6 m)   Wt 119 lb (54 kg)   SpO2 97%   BMI 21.08 kg/m  Wt Readings from Last 3 Encounters:  04/13/23 119 lb (54 kg)  03/10/23 119 lb 4 oz (54.1 kg)  08/28/22 120 lb (54.4 kg)       Dulce Sellar, NP

## 2023-04-22 ENCOUNTER — Ambulatory Visit (HOSPITAL_COMMUNITY)
Admission: EM | Admit: 2023-04-22 | Discharge: 2023-04-22 | Disposition: A | Discharge status: Home / Self Care | Payer: Medicare Other | Attending: Family | Admitting: Family

## 2023-04-22 DIAGNOSIS — F411 Generalized anxiety disorder: Secondary | ICD-10-CM | POA: Diagnosis not present

## 2023-04-22 DIAGNOSIS — F319 Bipolar disorder, unspecified: Secondary | ICD-10-CM | POA: Insufficient documentation

## 2023-04-22 DIAGNOSIS — F4321 Adjustment disorder with depressed mood: Secondary | ICD-10-CM | POA: Diagnosis present

## 2023-04-22 DIAGNOSIS — Z79899 Other long term (current) drug therapy: Secondary | ICD-10-CM | POA: Diagnosis not present

## 2023-04-22 NOTE — ED Provider Notes (Signed)
Behavioral Health Urgent Care Medical Screening Exam  Patient Name: Jaclyn [name] MRN: [id number] Date of Evaluation: 04/22/23 Chief Complaint:   Diagnosis:  Final diagnoses:  Adjustment disorder with depressed mood    History of Present illness: Jaclyn [name] is a 67 y.o. female.  Patient presented voluntarily to behavioral health walk-in clinic today.  Patient transported by  daughter and primary caregiver, Ciara.  Patient's daughter does not remain present during assessment.  Patient is assessed by this nurse practitioner face-to-face.  She is seated in wheelchair in assessment area.  She is alert and oriented, pleasant and cooperative during assessment.  Presents with depressed mood, congruent affect.  Chart reviewed and patient discussed with Dr. Nelly Rout on 04/22/2023.  Maddix states "I would like to be in a facility, I am not able to do a lot of things at home anymore, I can only walk short distances, I have to hold onto walls to get around, I can barely dress myself and I can feed myself but only prepare simple meals." Patient shares she would like to be placed in some sort of care facility.  She is uncertain what facilities may be available.  She has reached out to primary care provider for [facility].  Recent stressors include patient's daughter/primary caregiver who has been offered a new job in [place].  Patient is concerned that her daughter will moved to [place] and believes she will not be invited to join her daughter.  Patient's diagnoses include bipolar disorder, generalized anxiety disorder and adjustment disorder.  She is followed for outpatient psychiatry by [facility], medication management with Caroline/prescriber.  She is compliant with medication and does manage her home medications.  Medicines include BuSpar 10 mg 3 times daily, risperidone 0.5 mg twice daily and sertraline 50 mg daily.  Patient endorses history of previous inpatient psychiatric hospitalizations.   Most recent hospitalization at [facility] 2022.  No family mental health or addiction history reported.  Patient denies suicidal and homicidal ideations.  She easily contracts verbally for safety at this time.  She denies auditory and visual hallucinations.  There is no evidence of delusional thought content no indication that patient is responding to internal stimuli.  Jaclyn [name] resides in [place] with her daughter Charlynne Cousins.  She denies access to weapons.  She receives disability income.  She endorses average sleep and appetite.  She denies alcohol and substance use.  Patient offered support and encouragement.  She gives verbal consent to speak with her daughter, Charlynne Cousins.  Patient's daughter denies safety concerns.  Verbalizes plan to follow-up with resources as provided regarding potential placement outside of home.   Patient and family are educated and verbalize understanding of mental health resources and other crisis services in the community. They are instructed to call 911 and present to the nearest emergency room should patient experience any suicidal/homicidal ideation, auditory/visual/hallucinations, or detrimental worsening of mental health condition.        Flowsheet Row ED from 04/22/2023 in [facility] ED from 02/24/2023 in [facility] at [facility] ED from 08/28/2022 in [facility] Emergency Department at [facility]  C-SSRS RISK CATEGORY High Risk No Risk No Risk       Psychiatric Specialty Exam  Presentation  General Appearance:Appropriate for Environment; Casual  Eye Contact:Good  Speech:Clear and Coherent; Normal Rate  Speech Volume:Normal  Handedness:Right   Mood and Affect  Mood: Depressed  Affect: Congruent   Thought Process  Thought Processes:  Coherent; Goal Directed; Linear  Descriptions of Associations:Intact  Orientation:Full (Time, Place and Person)  Thought Content:Logical;  WDL  Diagnosis of Schizophrenia or Schizoaffective disorder in past: No data recorded  Hallucinations:None  Ideas of Reference:None  Suicidal Thoughts:No  Homicidal Thoughts:No   Sensorium  Memory: Immediate Fair  Judgment: Intact  Insight: Fair   Chartered certified accountant: Fair  Attention Span: Fair  Recall: [employer] of Knowledge: Fair  Language: Fair   Psychomotor Activity  Psychomotor Activity: Normal   Assets  Assets: Manufacturing systems engineer; Desire for Improvement; Financial Resources/Insurance; Housing; Resilience; Social Support   Sleep  Sleep: Good  Number of hours: No data recorded  Physical Exam: Physical Exam Vitals and nursing note reviewed.  Constitutional:      Appearance: Normal appearance. She is well-developed.  HENT:     Head: Normocephalic and atraumatic.     Nose: Nose normal.  Cardiovascular:     Rate and Rhythm: Normal rate.  Pulmonary:     Effort: Pulmonary effort is normal.  Musculoskeletal:        General: Normal range of motion.     Cervical back: Normal range of motion.  Skin:    General: Skin is warm and dry.  Neurological:     Mental Status: She is alert and oriented to person, place, and time.  Psychiatric:        Attention and Perception: Attention and perception normal.        Mood and Affect: Affect normal. Mood is depressed.        Speech: Speech normal.        Behavior: Behavior normal. Behavior is cooperative.        Thought Content: Thought content normal.        Cognition and Memory: Cognition normal.    Review of Systems  Constitutional: Negative.   HENT: Negative.    Eyes: Negative.   Respiratory: Negative.    Cardiovascular: Negative.   Gastrointestinal: Negative.   Genitourinary: Negative.   Musculoskeletal: Negative.   Skin: Negative.   Neurological: Negative.   Psychiatric/Behavioral:  Positive for depression.    Blood pressure (!) 159/76, pulse 78, temperature (!) 97.4  F (36.3 C), temperature source Oral, resp. rate 20, SpO2 98 %. There is no height or weight on file to calculate BMI.  Musculoskeletal: Strength & Muscle Tone: within normal limits Gait & Station: normal Patient leans: N/A   [facility] MSE Discharge Disposition for Follow up and Recommendations: Based on my evaluation the patient does not appear to have an emergency medical condition and can be discharged with resources and follow up care in outpatient services for Medication Management and Individual Therapy Follow-up with established outpatient psychiatry.  Continue current medications. Follow-up with case management resources provided.  Lenard Lance, FNP 04/22/2023, 2:22 PM

## 2023-04-22 NOTE — Progress Notes (Signed)
   04/22/23 1221  [facility] Triage Screening (Walk-ins at [facility] only)  What Is the Reason for Your Visit/Call Today? ROUTINE: Jaclyn [name] is a 67 y/o presenting to the [facility] as a walk-in. She is accompanied by her daughter/caregiver Charlynne Cousins Na[place]). Diagnosed with depression and Bipolar Disorder. States that she is prescribed psychotropic medications by her psychiatrist Barbette Merino) to manage related symptoms. Patient has a complaint today of worsening depression over the past several weeks. Also, current suicidal ideations with a plan to overdose. She has access to medications and 3 prior suicide attempts. Patient does not recall when she had her last suicide attempt but says she was admitted to a facility for crises stabilization. She has been hospitalized for psychiatric reasons 2-3 times in the past. When asked if she has homicidal ideations, she denies. However, says she is very angry with daughter present with her today. Patient wants to live in a ALF and her daughter isn't doing enough to get her into a facility.  Her daughter states that patient's PCP completed a FL2 to assist with patient's placement. She has tried to explain to patient that it's not a quick process, however; says patient doesn't understand the process. Patient further says that she is not able to care for herself and this triggers her worsening depressive symptoms. According to patient, if she can get into an ALF, she will no longer feel suicidal.  Patient currently lives with her daughter whom also provides some assistance with all ADL's. Patient denies AVH's and hx of alcohol/drug use. Patient is cooperative. Depressed mood and affect.  How Long Has This Been Causing You Problems? > than 6 months  Have You Recently Had Any Thoughts About Hurting Yourself? No  Are You Planning to Commit Suicide/Harm Yourself At This time? No  Have you Recently Had Thoughts About Hurting Someone Karolee Ohs? No  Are You Planning To Harm Someone At This Time?  No  Are you currently experiencing any auditory, visual or other hallucinations? No  Have You Used Any Alcohol or Drugs in the Past 24 Hours? No  Do you have any current medical co-morbidities that require immediate attention? No  Clinician description of patient physical appearance/behavior: Casual/Cooperative.  What Do You Feel Would Help You the Most Today? Treatment for Depression or other mood problem  If access to [facility] was not available, would you have sought care in the Emergency Department? No  Determination of Need Routine (7 days)  Options For Referral Medication Management;Outpatient Therapy;ALF/SNF

## 2023-04-22 NOTE — Discharge Instructions (Addendum)
Patient is instructed prior to discharge to:  Take all medications as prescribed by his/her mental healthcare provider. Report any adverse effects and or reactions from the medicines to his/her outpatient provider promptly. Keep all scheduled appointments, to ensure that you are getting refills on time and to avoid any interruption in your medication.  If you are unable to keep an appointment call to reschedule.  Be sure to follow-up with resources and follow-up appointments provided.  Patient has been instructed & cautioned: To not engage in alcohol and or illegal drug use while on prescription medicines. In the event of worsening symptoms, patient is instructed to call the crisis hotline, 911 and or go to the nearest ED for appropriate evaluation and treatment of symptoms. To follow-up with his/her primary care provider for your other medical issues, concerns and or health care needs.  Information: -National Suicide Prevention Lifeline 1-800-SUICIDE or 501-554-3665.  -988 offers 24/7 access to trained crisis counselors who can help people experiencing mental health-related distress. People can call or text 988 or chat 988lifeline.org for themselves or if they are worried about a loved one who may need crisis support.        Writer spoke to the patients daughter and informed and reviewed Tailored Care Management services that are offered through [facility]   Based on the information that you have provided and the presenting issues Tailored Care Management resources for have been recommended.  It is imperative that you follow through with treatment recommendations within 5-7 days from the of discharge to mitigate further risk to your safety and mental well-being. A list of referrals has been provided below to get you started.  You are not limited to the list provided.  In case of an urgent crisis, you may contact the Mobile Crisis Unit with [facility] at  1.317-086-3714.    Tailored Care Management  Recipient Services at [phone]  What is Tailored Care Management (TCM)? Tailored Care Management is an important part [employer]. Tailored Care Management provides whole-person care from all health care providers. Whole person care brings together all of a person's needs, including behavioral health, physical health, pharmacy, and unmet health-related resource needs. Tailored Care Management means better health outcomes for our members.  Member Choice in TCM You have a choice in where you receive Tailored Care Management: Care Management Agencies [facility]): provider organizations with experience providing behavioral health, intellectual/developmental disability, and/or traumatic brain injury services to our population. [facility] ([facility]): primary care practices whose providers have experience providing primary care services to our population. [employer] Tailored Engineer, manufacturing who work at [employer].   Here are some things to think about when you choose how you get Tailored Care Management: The providers you are seeing now. Your specific health care needs. How serious are your physical medical needs are. Where you live.  [employer] will help match you to a Care Manager that has specialized training to meet your needs. You may change your Care Manager twice a year for any reason and at any time with a good reason. You can choose not to have a Care Manager at any time by calling Member and Recipient Services at [phone] or completing the form in the Member Portal.  Elements of Tailored Care Management [employer] members can stop getting Tailored Care Management at any time. They will still be supported as needed through care coordination and care transitions (see Member Handbook for more information).   [employer] accepts referrals for children who may be  at risk of not returning to the community or unable to maintain  their community setting. Please submit information on the secure form below.  If you choose to stop Tailored Care Management We will still help those members through care coordination and care transitions who are not in Tailored Care Management. Care coordination will help with finding housing and other unmet health-related resource needs. [employer] will also help with care transitions such as moving from one clinical setting to another.  What is TCM for State-Funded Recipients? Although there will be many things that are similar to Tailored Care Management received by Medicaid members, there are some differences for state-funded recipients. State-funded recipients with the highest level of need will receive Case or Care Management.??  For recipients with behavioral health diagnoses, Case Management is available for children and adults with serious mental health or substance use disorder needs. Koren Shiver has a Financial controller for case management services. For recipients with I/DD and TBI diagnoses, [employer] provides Care Management.??  Recipients can get case management if they cannot receive Medicaid and meet certain criteria.  [employer] has a waitlist for those state-funded recipients who are waiting to receive Care or Case Management. Providers and others can suggest recipients be added to the list; please call [employer] at [phone] if you feel you need to be placed on this list.?

## 2023-04-26 ENCOUNTER — Encounter: Payer: Self-pay | Admitting: Family

## 2023-04-26 ENCOUNTER — Ambulatory Visit (INDEPENDENT_AMBULATORY_CARE_PROVIDER_SITE_OTHER): Payer: Medicare Other | Admitting: Family

## 2023-04-26 VITALS — BP 153/75 | HR 103 | Temp 97.5°F | Ht 63.0 in | Wt 117.4 lb

## 2023-04-26 DIAGNOSIS — Z820 Family history of epilepsy and other diseases of the nervous system: Secondary | ICD-10-CM

## 2023-04-26 DIAGNOSIS — R269 Unspecified abnormalities of gait and mobility: Secondary | ICD-10-CM

## 2023-04-26 DIAGNOSIS — Z7183 Encounter for nonprocreative genetic counseling: Secondary | ICD-10-CM

## 2023-04-26 NOTE — Progress Notes (Unsigned)
Patient ID: Jaclyn [name], female    DOB: 11-08-1956, 67 y.o.   MRN: [id number]  Chief Complaint  Patient presents with   Assisted Living     Pt states she needs a diagnoses for her mobility in order to be placed in an facility. Genetic testing for huntington's disease, family hx.     HPI:      Abnormal gait:   states problem with mobility issues about 2 years ago, was admitted for about 2 weeks OD with several RX meds admitted in hospital around 2013 & 2014 and discharged in good condition. reports weakness in both legs, feels heavy in her legs, dtr reports her gait is with both legs in a splayed fashion.   Genetic counseling:  reports father had huntington's disease and states she has been having sx of difficulty swallowing and making throat sounds, kind of grunting, comes & goes and not able to control. She also reports worsening mobility over the last couple of years with leg weakness, falls and fear of falling preventing her from ambulating more.   Assessment & Plan:  1. Encounter for nonprocreative genetic counseling *** - Ambulatory referral to Genetics  2. Abnormality of gait and mobility *** - Ambulatory referral to Physical Therapy    Subjective:    Outpatient Medications Prior to Visit  Medication Sig Dispense Refill   acetaminophen (TYLENOL) 325 MG tablet Take 2 tablets (650 mg total) by mouth every 6 (six) hours as needed for moderate pain. 30 tablet 0   busPIRone (BUSPAR) 10 MG tablet Take 1 tablet (10 mg total) by mouth 3 (three) times daily.     glipiZIDE (GLUCOTROL) 10 MG tablet Take 10 mg by mouth every morning.     glucose blood test strip Use to check blood sugar twice daily (Patient taking differently: 1 each by Other route as needed (blood sugar). One touch verio--Use to check blood sugar twice daily) 100 each 5   Insulin Detemir (LEVEMIR FLEXTOUCH) 100 UNIT/ML Pen Inject 28 Units into the skin daily at 10 pm. (Patient taking differently: Inject 30 Units into  the skin 2 (two) times daily.) 9 mL 0   Insulin Pen Needle (BD PEN NEEDLE NANO U/F) 32G X 4 MM MISC Test daily. 100 each 5   meloxicam (MOBIC) 15 MG tablet Take 1 tablet (15 mg total) by mouth daily. 30 tablet 0   metFORMIN (GLUCOPHAGE) 1000 MG tablet TAKE ONE TABLET BY MOUTH TWICE DAILY WITH  A  MEAL (Patient taking differently: Take 1,000 mg by mouth 2 (two) times daily with a meal.) 180 tablet 3   Multiple Vitamin (MULTIVITAMIN WITH MINERALS) TABS tablet Take 1 tablet by mouth daily.     Olmesartan-amLODIPine-HCTZ 40-10-25 MG TABS Take 1 tablet by mouth daily. 90 tablet 0   omeprazole (PRILOSEC) 40 MG capsule Take 40 mg by mouth daily.     risperiDONE (RISPERDAL) 0.5 MG tablet TAKE ONE TABLET BY MOUTH TWICE DAILY FOR MOOD  CONTROL. (Patient taking differently: Take 0.5-1 mg by mouth See admin instructions. 0.5mg  in AM and 1mg  in evening) 180 tablet 0   rosuvastatin (CRESTOR) 10 MG tablet 1 tablet     No facility-administered medications prior to visit.   Past Medical History:  Diagnosis Date   Abnormal EKG 11/09/2012   Arthritis    Delirium, drug-induced 11/10/2012   Depression    Ear pressure, bilateral 07/15/2020   Educated about COVID-19 virus infection 04/24/2019   Eustachian tube dysfunction, bilateral 01/19/2019  Gastritis 04/28/2022   Hypertension    Referred otalgia of both ears 01/19/2019   Suicidal behavior 11/09/2012   Past Surgical History:  Procedure Laterality Date   NASAL SEPTUM SURGERY     TUBAL LIGATION     Allergies  Allergen Reactions   Codeine Nausea And Vomiting    States that she cannot take in high doses   Flagyl [Metronidazole] Other (See Comments)    Fast heartbeat,tingly   Metronidazole Benzo+Syrspend    Morphine And Related Nausea And Vomiting    States that she cannot take in high doses   Remeron [Mirtazapine] Other (See Comments)    Severe restlessness,irritable,hyperactivity,agitation   Duloxetine Hcl Rash      Objective:    Physical  Exam Vitals and nursing note reviewed.  Constitutional:      Appearance: Normal appearance.  Cardiovascular:     Rate and Rhythm: Normal rate and regular rhythm.  Pulmonary:     Effort: Pulmonary effort is normal.     Breath sounds: Normal breath sounds.  Musculoskeletal:        General: Normal range of motion.  Skin:    General: Skin is warm and dry.  Neurological:     Mental Status: She is alert.  Psychiatric:        Mood and Affect: Mood normal.        Behavior: Behavior normal.    BP (!) 153/75 (BP Location: Left Arm, Patient Position: Sitting, Cuff Size: Normal)   Pulse (!) 103   Temp (!) 97.5 F (36.4 C) (Temporal)   Ht 5\' 3"  (1.6 m)   Wt 117 lb 6.4 oz (53.3 kg)   SpO2 95%   BMI 20.80 kg/m  Wt Readings from Last 3 Encounters:  04/26/23 117 lb 6.4 oz (53.3 kg)  04/13/23 119 lb (54 kg)  03/10/23 119 lb 4 oz (54.1 kg)       Dulce Sellar, NP

## 2023-04-27 ENCOUNTER — Encounter: Payer: Self-pay | Admitting: Family

## 2023-04-27 ENCOUNTER — Telehealth: Payer: Self-pay | Admitting: Family

## 2023-04-27 NOTE — Telephone Encounter (Signed)
I am sending a referral to Neuro instead, separate MyChart msg has been sent to notify pt.

## 2023-04-27 NOTE — Telephone Encounter (Signed)
The cancer center states they do not do genetics at their office. Please send referral to a different office that does the genetics. Please advise.

## 2023-04-29 ENCOUNTER — Emergency Department (HOSPITAL_COMMUNITY): Payer: Medicare Other

## 2023-04-29 ENCOUNTER — Emergency Department (HOSPITAL_COMMUNITY)
Admission: EM | Admit: 2023-04-29 | Discharge: 2023-04-30 | Disposition: A | Discharge status: Home / Self Care | Payer: Medicare Other | Attending: Emergency Medicine | Admitting: Emergency Medicine

## 2023-04-29 ENCOUNTER — Other Ambulatory Visit: Payer: Self-pay

## 2023-04-29 ENCOUNTER — Ambulatory Visit (INDEPENDENT_AMBULATORY_CARE_PROVIDER_SITE_OTHER)
Admission: EM | Admit: 2023-04-29 | Discharge: 2023-04-29 | Disposition: A | Discharge status: Home / Self Care | Payer: Medicare Other | Source: Home / Self Care

## 2023-04-29 ENCOUNTER — Encounter (HOSPITAL_COMMUNITY): Payer: Self-pay

## 2023-04-29 DIAGNOSIS — R4689 Other symptoms and signs involving appearance and behavior: Secondary | ICD-10-CM | POA: Insufficient documentation

## 2023-04-29 DIAGNOSIS — Z79899 Other long term (current) drug therapy: Secondary | ICD-10-CM | POA: Insufficient documentation

## 2023-04-29 DIAGNOSIS — Z794 Long term (current) use of insulin: Secondary | ICD-10-CM | POA: Diagnosis not present

## 2023-04-29 DIAGNOSIS — G10 Huntington's disease: Secondary | ICD-10-CM | POA: Insufficient documentation

## 2023-04-29 DIAGNOSIS — F319 Bipolar disorder, unspecified: Secondary | ICD-10-CM | POA: Insufficient documentation

## 2023-04-29 DIAGNOSIS — R296 Repeated falls: Secondary | ICD-10-CM | POA: Insufficient documentation

## 2023-04-29 DIAGNOSIS — F4329 Adjustment disorder with other symptoms: Secondary | ICD-10-CM | POA: Insufficient documentation

## 2023-04-29 DIAGNOSIS — G319 Degenerative disease of nervous system, unspecified: Secondary | ICD-10-CM | POA: Diagnosis not present

## 2023-04-29 DIAGNOSIS — R45851 Suicidal ideations: Secondary | ICD-10-CM | POA: Insufficient documentation

## 2023-04-29 DIAGNOSIS — I10 Essential (primary) hypertension: Secondary | ICD-10-CM | POA: Diagnosis not present

## 2023-04-29 DIAGNOSIS — M6281 Muscle weakness (generalized): Secondary | ICD-10-CM | POA: Insufficient documentation

## 2023-04-29 LAB — URINALYSIS, ROUTINE W REFLEX MICROSCOPIC
Bacteria, UA: NONE SEEN
Bilirubin Urine: NEGATIVE
Glucose, UA: >=500 mg/dL — AB
Hgb urine dipstick: NEGATIVE
Ketones, ur: NEGATIVE mg/dL
Leukocytes,Ua: NEGATIVE
Nitrite: NEGATIVE
Protein, ur: NEGATIVE mg/dL
Specific Gravity, Urine: 1.009 (ref 1.005–1.030)
pH: 5 (ref 5.0–8.0)

## 2023-04-29 LAB — CBC
HCT: 35.6 % — ABNORMAL LOW (ref 36.0–46.0)
Hemoglobin: 11.2 g/dL — ABNORMAL LOW (ref 12.0–15.0)
MCH: 27.4 pg (ref 26.0–34.0)
MCHC: 31.5 g/dL (ref 30.0–36.0)
MCV: 87 fL (ref 80.0–100.0)
Platelets: 261 10*3/uL (ref 150–400)
RBC: 4.09 MIL/uL (ref 3.87–5.11)
RDW: 13.4 % (ref 11.5–15.5)
WBC: 10.8 10*3/uL — ABNORMAL HIGH (ref 4.0–10.5)
nRBC: 0 % (ref 0.0–0.2)

## 2023-04-29 LAB — COMPREHENSIVE METABOLIC PANEL
ALT: 18 U/L (ref 0–44)
AST: 16 U/L (ref 15–41)
Albumin: 4.6 g/dL (ref 3.5–5.0)
Alkaline Phosphatase: 57 U/L (ref 38–126)
Anion gap: 13 (ref 5–15)
BUN: 19 mg/dL (ref 8–23)
CO2: 28 mmol/L (ref 22–32)
Calcium: 9.4 mg/dL (ref 8.9–10.3)
Chloride: 96 mmol/L — ABNORMAL LOW (ref 98–111)
Creatinine, Ser: 0.78 mg/dL (ref 0.44–1.00)
GFR, Estimated: >60 mL/min (ref >60)
Glucose, Bld: 283 mg/dL — ABNORMAL HIGH (ref 70–99)
Potassium: 3.8 mmol/L (ref 3.5–5.1)
Sodium: 137 mmol/L (ref 135–145)
Total Bilirubin: 0.5 mg/dL (ref 0.3–1.2)
Total Protein: 8 g/dL (ref 6.5–8.1)

## 2023-04-29 LAB — ETHANOL: Alcohol, Ethyl (B): <10 mg/dL (ref <10)

## 2023-04-29 LAB — CBG MONITORING, ED
Glucose-Capillary: 170 mg/dL — ABNORMAL HIGH (ref 70–99)
Glucose-Capillary: 110 mg/dL — ABNORMAL HIGH (ref 70–99)

## 2023-04-29 MED ORDER — HYDROCHLOROTHIAZIDE 25 MG PO TABS
25.0000 mg | ORAL_TABLET | Freq: Every day | ORAL | Status: DC
  Administered 2023-04-29 – 2023-04-30 (×2): 25 mg via ORAL
  Filled 2023-04-29 (×2): qty 1

## 2023-04-29 MED ORDER — INSULIN DETEMIR 100 UNIT/ML FLEXPEN: 30.0000 [IU] | PEN_INJECTOR | Freq: Two times a day (BID) | SUBCUTANEOUS | Status: DC

## 2023-04-29 MED ORDER — SERTRALINE HCL 50 MG PO TABS
200.0000 mg | ORAL_TABLET | Freq: Every day | ORAL | Status: DC
  Administered 2023-04-29 – 2023-04-30 (×2): 200 mg via ORAL
  Filled 2023-04-29 (×2): qty 4

## 2023-04-29 MED ORDER — ACETAMINOPHEN 325 MG PO TABS: 650.0000 mg | ORAL_TABLET | ORAL | Status: DC | PRN

## 2023-04-29 MED ORDER — RISPERIDONE 1 MG PO TABS
1.0000 mg | ORAL_TABLET | Freq: Every day | ORAL | Status: DC
  Administered 2023-04-29: 1 mg via ORAL
  Filled 2023-04-29: qty 2

## 2023-04-29 MED ORDER — GADOBUTROL 1 MMOL/ML IV SOLN
5.0000 mL | Freq: Once | INTRAVENOUS | Status: AC | PRN
  Administered 2023-04-29: 5 mL via INTRAVENOUS

## 2023-04-29 MED ORDER — AMLODIPINE BESYLATE 5 MG PO TABS
10.0000 mg | ORAL_TABLET | Freq: Every day | ORAL | Status: DC
  Administered 2023-04-29 – 2023-04-30 (×2): 10 mg via ORAL
  Filled 2023-04-29 (×2): qty 2

## 2023-04-29 MED ORDER — INSULIN ASPART 100 UNIT/ML IJ SOLN
0.0000 [IU] | Freq: Every day | INTRAMUSCULAR | Status: DC
  Filled 2023-04-29: qty 0.05

## 2023-04-29 MED ORDER — RISPERIDONE 0.5 MG PO TABS: 0.5000 mg | ORAL_TABLET | ORAL | Status: DC

## 2023-04-29 MED ORDER — INSULIN ASPART 100 UNIT/ML IJ SOLN
0.0000 [IU] | Freq: Three times a day (TID) | INTRAMUSCULAR | Status: DC
  Administered 2023-04-29 – 2023-04-30 (×2): 3 [IU] via SUBCUTANEOUS
  Administered 2023-04-30: 2 [IU] via SUBCUTANEOUS
  Filled 2023-04-29: qty 0.15

## 2023-04-29 MED ORDER — INSULIN ASPART 100 UNIT/ML IJ SOLN
6.0000 [IU] | Freq: Once | INTRAMUSCULAR | Status: AC
  Administered 2023-04-29: 6 [IU] via SUBCUTANEOUS
  Filled 2023-04-29: qty 0.06

## 2023-04-29 MED ORDER — METFORMIN HCL 500 MG PO TABS
1000.0000 mg | ORAL_TABLET | Freq: Two times a day (BID) | ORAL | Status: DC
  Administered 2023-04-29 – 2023-04-30 (×3): 1000 mg via ORAL
  Filled 2023-04-29 (×3): qty 2

## 2023-04-29 MED ORDER — INSULIN GLARGINE 100 UNITS/ML SOLOSTAR PEN: 20.0000 [IU] | PEN_INJECTOR | Freq: Every day | SUBCUTANEOUS | Status: DC

## 2023-04-29 MED ORDER — GLIPIZIDE 10 MG PO TABS
10.0000 mg | ORAL_TABLET | Freq: Every day | ORAL | Status: DC
  Administered 2023-04-30: 10 mg via ORAL
  Filled 2023-04-29 (×2): qty 1

## 2023-04-29 MED ORDER — RISPERIDONE 0.5 MG PO TABS
0.5000 mg | ORAL_TABLET | Freq: Every morning | ORAL | Status: DC
  Administered 2023-04-30: 0.5 mg via ORAL
  Filled 2023-04-29: qty 1

## 2023-04-29 MED ORDER — ADULT MULTIVITAMIN W/MINERALS CH
1.0000 | ORAL_TABLET | Freq: Every day | ORAL | Status: DC
  Administered 2023-04-29 – 2023-04-30 (×2): 1 via ORAL
  Filled 2023-04-29 (×2): qty 1

## 2023-04-29 MED ORDER — INSULIN GLARGINE-YFGN 100 UNIT/ML ~~LOC~~ SOLN
20.0000 [IU] | Freq: Every day | SUBCUTANEOUS | Status: DC
  Filled 2023-04-29: qty 0.2

## 2023-04-29 MED ORDER — OLMESARTAN-AMLODIPINE-HCTZ 40-10-25 MG PO TABS: 1.0000 | ORAL_TABLET | Freq: Every day | ORAL | Status: DC

## 2023-04-29 MED ORDER — ROSUVASTATIN CALCIUM 20 MG PO TABS
10.0000 mg | ORAL_TABLET | Freq: Every day | ORAL | Status: DC
  Administered 2023-04-29 – 2023-04-30 (×2): 10 mg via ORAL
  Filled 2023-04-29 (×2): qty 1

## 2023-04-29 MED ORDER — ALUM & MAG HYDROXIDE-SIMETH 200-200-20 MG/5ML PO SUSP: 30.0000 mL | Freq: Four times a day (QID) | ORAL | Status: DC | PRN

## 2023-04-29 MED ORDER — PANTOPRAZOLE SODIUM 40 MG PO TBEC
40.0000 mg | DELAYED_RELEASE_TABLET | Freq: Every day | ORAL | Status: DC
  Administered 2023-04-29 – 2023-04-30 (×2): 40 mg via ORAL
  Filled 2023-04-29 (×2): qty 1

## 2023-04-29 MED ORDER — BUSPIRONE HCL 10 MG PO TABS
10.0000 mg | ORAL_TABLET | Freq: Three times a day (TID) | ORAL | Status: DC
  Administered 2023-04-29 – 2023-04-30 (×4): 10 mg via ORAL
  Filled 2023-04-29 (×4): qty 1

## 2023-04-29 MED ORDER — ONDANSETRON HCL 4 MG PO TABS: 4.0000 mg | ORAL_TABLET | Freq: Three times a day (TID) | ORAL | Status: DC | PRN

## 2023-04-29 MED ORDER — INSULIN GLARGINE-YFGN 100 UNIT/ML ~~LOC~~ SOLN
20.0000 [IU] | Freq: Every day | SUBCUTANEOUS | Status: DC
  Administered 2023-04-29: 20 [IU] via SUBCUTANEOUS
  Filled 2023-04-29 (×2): qty 0.2

## 2023-04-29 MED ORDER — IRBESARTAN 300 MG PO TABS
300.0000 mg | ORAL_TABLET | Freq: Every day | ORAL | Status: DC
  Administered 2023-04-29 – 2023-04-30 (×2): 300 mg via ORAL
  Filled 2023-04-29 (×2): qty 1

## 2023-04-29 NOTE — Discharge Instructions (Signed)
See the attached resources. If any safety concerns or suicidal thoughts become more intrusive and include a plan, go to nearest Emergency Department for evaluation. I have included a list of Assisted Living facilities in [place] and [place], place call locations directly to inquire about admission criteria and admission process.

## 2023-04-29 NOTE — Care Management (Signed)
Care Management  10:28a: Writer contacted [facility] to determine if services would be appropriate for the patient. This Clinical research associate was informed that there is a 30-50 day process to enroll and that they tend to accept patients who want to live on their own. Since the patient and their daughter are planning on finding a long-term assisted living facility for the patient, this seems like it would not be a good fit.  10:43a: Clinical research associate spoke with Cala Bradford, NP, and provided paper resources on adult day centers in the area.

## 2023-04-29 NOTE — Progress Notes (Signed)
At this time patient is still awaiting PT. TOC will follow up in the AM.

## 2023-04-29 NOTE — ED Triage Notes (Addendum)
Pt reports sent by [facility]. Pt states "I just don't care anymore and don't think I am safe to stay at home by myself". C/o of feeling like this for a while.  Daughter reports recently been having mobility issues and trying to get genetic testing for huntington's disease

## 2023-04-29 NOTE — ED Provider Notes (Signed)
Clinical Course as of 04/29/23 1738  Thu Apr 29, 2023  1507 78 F with SI. Being worked up for [employer] TOC consulted for placement. Was cleared by psych at [facility]. Follow up MRI brain.  [VB]  1517 Patient is medically cleared.  Seen by [facility] earlier today recommending outpatient therapy. She was mildly hyperglycemic to 83 without anion gap normal bicarbonate no DKA.  I have ordered for insulin NovoLog for correction.  UA without evidence of UTI.  MRI brain with and without contrast resulted with chronic lacunar infarcts.  No acute findings.  She is medically clear.  She is waiting on TOC for placement.  IMPRESSION: 1.  No evidence of an acute intracranial abnormality. 2. 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 3. Background mild-to-moderate for age multifocal T2 FLAIR hyperintense signal abnormality within the cerebral white matter, nonspecific but most often secondary to chronic small vessel ischemia. 4. Generalized cerebral and cerebellar atrophy, overall mild-to-moderate in severity and greater than expected for age. 5. Left sphenoid sinusitis.   [VB]  1737 Confirmed with social worker [place] patient is pending PT evaluation.  Depending on their recommendations we will likely end up waiting on SNF placement. [VB]    Clinical Course User Index [VB] Mardene Sayer, MD      Mardene Sayer, MD 04/29/23 [phone]

## 2023-04-29 NOTE — Progress Notes (Addendum)
Transition of Care [facility]) - Emergency Department Mini Assessment   Patient Details  Name: Jaclyn [name] MRN: [id number] Date of Birth: 1956-12-01  Transition of Care [facility]) CM/SW Contact:    Princella Ion, LCSW Phone Number: 04/29/2023, 1:14 PM   Clinical Narrative: This CSW spoke with pt's daughter, Charlynne Cousins, who reported she'd like the pt "placed into an ALF today." This CSW explained that the hospital does not place for LTC but informed we can attempt to lace for STR. Explained to pt's daughter that Medicaid is the long term payor source. Per Ciara, the pt does have Medicaid. This CSW explained that the pt will need to flip her Medicaid to LTC. Ciara verbalized understanding.   Ciara states they don't have a preferred facility and reported they're open to the pt being faxed out to neighboring counties as well. TOC will follow for PT recommendation. *This CSW noted consult to neurology; PT may not sign on until pt is medically stable.*   ED Mini Assessment: What brought you to the Emergency Department? : pt doesn't feel like she can stay home alone  Barriers to Discharge: SNF Pending bed offer     Means of departure: Not know       Patient Contact and Communications Key Contact 1: Daughter (ciara)   Spoke with: Charlynne Cousins (daughter) Contact Date: 04/29/23,   Contact time: 0105 Contact Phone Number: [phone] Call outcome: Pt and daughter wishes for pt to go STR  Patient states their goals for this hospitalization and ongoing recovery are:: Rehab CMS Medicare.gov Compare Post Acute Care list provided to:: Patient Choice offered to / list presented to : Patient, Adult Children  Admission diagnosis:  SI Patient Active Problem List   Diagnosis Date Noted   Hyperlipidemia associated with type 2 diabetes mellitus (HCC) 04/28/2022   Hyperglycemia due to type 2 diabetes mellitus (HCC) 04/28/2022   Bilateral hearing loss 07/15/2020   Temporomandibular joint (TMJ) pain 07/15/2020    Chronic pansinusitis 01/19/2019   Laryngopharyngeal reflux (LPR) 01/19/2019   Perennial allergic rhinitis 01/19/2019   Adjustment disorder with mixed anxiety and depressed mood 04/15/2018   GAD (generalized anxiety disorder) 10/22/2017   Bipolar depression (HCC) 07/07/2013   Drug overdose, multiple drugs 11/09/2012   DM type 2 (diabetes mellitus, type 2) (HCC) 11/09/2012   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 [place] [address] Phone: [phone] Fax: [phone]

## 2023-04-29 NOTE — Progress Notes (Signed)
   04/29/23 0948  [facility] Triage Screening (Walk-ins at [facility] only)  How Did You Hear About [place]? Self  What Is the Reason for Your Visit/Call Today? Pt is a 67 yo female who presents to [facility] voluntarily accompanied by her daughter. Pt reports that she "doesn't care anymore". Pt reports that she is tired of living. Pt reports SI without a plan. Per pt's daughter, pt is not taking her medications. Pt reports lack of appetite and fair sleeping. Pt's daughter reports that patient has a psychiatrist [employer]) but is currently not seeing a therapist. Pt denies HI and AVH.  How Long Has This Been Causing You Problems? 1 wk - 1 month  Have You Recently Had Any Thoughts About Hurting Yourself? Yes  How long ago did you have thoughts about hurting yourself? Pt reports feeling SI " for a while". However, pt denies a plan.  Are You Planning to Commit Suicide/Harm Yourself At This time? No  Have you Recently Had Thoughts About Hurting Someone Karolee Ohs? No  Are You Planning To Harm Someone At This Time? No  Are you currently experiencing any auditory, visual or other hallucinations? No  Have You Used Any Alcohol or Drugs in the Past 24 Hours? No  Do you have any current medical co-morbidities that require immediate attention? No  Clinician description of patient physical appearance/behavior: Pt was casually dressed and adequately groomed. Pt cooperative. Pt's speech, movement and thought content were delayed. Pt's mood was somewhat sad and a bit anxious with a flat affect. Pt was oriented x 4.  What Do You Feel Would Help You the Most Today? Treatment for Depression or other mood problem;Stress Management;Medication(s)  If access to [facility] was not available, would you have sought care in the Emergency Department? Yes  Determination of Need Routine (7 days)  Options For Referral Outpatient Therapy    Flowsheet Row ED from 04/29/2023 in [facility] ED from 04/22/2023 in  [facility] ED from 02/24/2023 in [facility] at [facility])  C-SSRS RISK CATEGORY Low Risk High Risk No Risk

## 2023-04-29 NOTE — ED Provider Notes (Signed)
[place] EMERGENCY DEPARTMENT AT [facility] Provider Note   CSN: [id number] Arrival date & time: 04/29/23  1119     History  Chief Complaint  Patient presents with   Psychiatric Evaluation    Jaclyn [name] is a 67 y.o. female.  HPI Patient presents after being seen behavioral health urgent care.  Has had a couple recent visits.  Has been diagnosed with adjustment disorder with passive suicidal ideations.  Patient does not want to live where she is currently living.  States she was suicidal because of it.  Has some sort of potential movement disorder.  Potentially Huntington's disease.  Father reportedly had Huntington's disease.  Is getting outpatient workup.  However not managing at home.  Also had been seen and outpatient workup recommended however came here because they stated they cannot manage it at home.  States that they do not think she would be suicidal if she had a different place to live.  Daughter is apparently also moving out of town.   Past Medical History:  Diagnosis Date   Abnormal EKG 11/09/2012   Arthritis    Delirium, drug-induced 11/10/2012   Depression    Ear pressure, bilateral 07/15/2020   Educated about COVID-19 virus infection 04/24/2019   Eustachian tube dysfunction, bilateral 01/19/2019   Gastritis 04/28/2022   Hypertension    Referred otalgia of both ears 01/19/2019   Suicidal behavior 11/09/2012    Home Medications Prior to Admission medications   Medication Sig Start Date End Date Taking? Authorizing Provider  acetaminophen (TYLENOL) 325 MG tablet Take 2 tablets (650 mg total) by mouth every 6 (six) hours as needed for moderate pain. 07/28/22   Wallis Bamberg, PA-C  busPIRone (BUSPAR) 10 MG tablet Take 1 tablet (10 mg total) by mouth 3 (three) times daily. 03/11/21   Estella Husk, MD  glipiZIDE (GLUCOTROL) 10 MG tablet Take 10 mg by mouth every morning. 12/07/21   [provider]  glucose blood test strip Use to check blood  sugar twice daily Patient taking differently: 1 each by Other route as needed (blood sugar). One touch verio--Use to check blood sugar twice daily 02/27/15   Worthy Rancher B, FNP  Insulin Detemir (LEVEMIR FLEXTOUCH) 100 UNIT/ML Pen Inject 28 Units into the skin daily at 10 pm. Patient taking differently: Inject 30 Units into the skin 2 (two) times daily. 07/04/15   Terressa Koyanagi, DO  Insulin Pen Needle (BD PEN NEEDLE NANO U/F) 32G X 4 MM MISC Test daily. 09/11/14   Toniann Ket, PA-C  meloxicam (MOBIC) 15 MG tablet Take 1 tablet (15 mg total) by mouth daily. 03/30/23   Madelyn Brunner, DO  metFORMIN (GLUCOPHAGE) 1000 MG tablet TAKE ONE TABLET BY MOUTH TWICE DAILY WITH  A  MEAL Patient taking differently: Take 1,000 mg by mouth 2 (two) times daily with a meal. 04/06/15   Terressa Koyanagi, DO  Multiple Vitamin (MULTIVITAMIN WITH MINERALS) TABS tablet Take 1 tablet by mouth daily.    [provider]  Olmesartan-amLODIPine-HCTZ 40-10-25 MG TABS Take 1 tablet by mouth daily. 04/06/23   Rollene Rotunda, MD  omeprazole (PRILOSEC) 40 MG capsule Take 40 mg by mouth daily. 01/01/20   [provider]  risperiDONE (RISPERDAL) 0.5 MG tablet TAKE ONE TABLET BY MOUTH TWICE DAILY FOR MOOD  CONTROL. Patient taking differently: Take 0.5-1 mg by mouth See admin instructions. 0.5mg  in AM and 1mg  in evening 05/24/15   Worthy Rancher B, FNP  rosuvastatin (CRESTOR) 10  MG tablet 1 tablet 12/09/21   [provider]  Sertraline HCl 200 MG CAPS Take 200 mg by mouth daily.    [provider]      Allergies    Codeine, Flagyl [metronidazole], Metronidazole benzo+syrspend, Morphine and related, Remeron [mirtazapine], and Duloxetine hcl    Review of Systems   Review of Systems  Physical Exam Updated Vital Signs BP (!) 155/78 (BP Location: Left Arm)   Pulse (!) 106   Temp 98.3 F (36.8 C) (Oral)   Resp 18   Wt 53 kg   SpO2 98%   BMI 20.70 kg/m  Physical Exam Vitals and nursing note reviewed.   HENT:     Head: Normocephalic.  Cardiovascular:     Rate and Rhythm: Normal rate.  Pulmonary:     Breath sounds: No wheezing.  Abdominal:     Tenderness: There is no abdominal tenderness.  Musculoskeletal:     Cervical back: Neck supple.  Neurological:     Mental Status: She is alert.     Comments: No chorea.     ED Results / Procedures / Treatments   Labs (all labs ordered are listed, but only abnormal results are displayed) Labs Reviewed  COMPREHENSIVE METABOLIC PANEL - Abnormal; Notable for the following components:      Result Value   Chloride 96 (*)    Glucose, Bld 283 (*)    All other components within normal limits  CBC - Abnormal; Notable for the following components:   WBC 10.8 (*)    Hemoglobin 11.2 (*)    HCT 35.6 (*)    All other components within normal limits  URINALYSIS, ROUTINE W REFLEX MICROSCOPIC - Abnormal; Notable for the following components:   Color, Urine STRAW (*)    Glucose, UA >=500 (*)    All other components within normal limits  ETHANOL    EKG None  Radiology No results found.  Procedures Procedures    Medications Ordered in ED Medications - No data to display  ED Course/ Medical Decision Making/ A&P                             Medical Decision Making Amount and/or Complexity of Data Reviewed Labs: ordered. Radiology: ordered.  Risk Prescription drug management.    patient brought in for has had potential placement. adjustments order set up pressure with it.  Some passive suicidal thoughts although has had previous suicide attempt.  Seen by behavioral health and had been cleared from a psychiatric standpoint unless things worsen.  Now comes here for potential placement.  Will get basic blood work.  Discussed with neurology since has not had neurologic workup.  Will get MRI with and without contrast to evaluate movement disorder.  Will also get basic blood work. TOC consulted and requested PT consult.  Blood work and  urine reassuring.  Patient has had MRI.  Patient is medically cleared for placement but MRI may add information.        Final Clinical Impression(s) / ED Diagnoses Final diagnoses:  None    Rx / DC Orders ED Discharge Orders     None         Benjiman Core, MD 04/29/23 1459

## 2023-04-29 NOTE — Evaluation (Signed)
Occupational Therapy Evaluation Patient Details Name: Jaclyn [name] MRN: [id number] DOB: 1956/04/07 Today's Date: 04/29/2023   History of Present Illness 67 y.o. female adm 5/2 after being seen behavioral health urgent care.  Has been diagnosed with adjustment disorder with passive suicidal ideations.  Referral sent to Neuro for worup of possible Hunt[place]'s Disease.   Clinical Impression   Patient admitted for the diagnosis above.  PTA she lives at home with her daughter, and both admit to decreasing mobility and functional independence.  Patient generally walks in/out of the home without an AD, and states she is needing additional assist with ADL completion from a sit to stand level.  Currently she is needing up to Mod A for ADL completion and Min A for mobility at a hand held level.  Patient presents very flat, needs increased processing time and cues to follow two step commands.  In addition, coordination deficits increase her fall risk.  Patient admits to a recent fall.  OT is indicated in the acute setting to address deficits, and the Patient will benefit from continued inpatient follow up therapy, <3 hours/day.  Patient currently does not have the required 24 hour assist to transition directly home.         Recommendations for follow up therapy are one component of a multi-disciplinary discharge planning process, led by the attending physician.  Recommendations may be updated based on patient status, additional functional criteria and insurance authorization.   Assistance Recommended at Discharge Frequent or constant Supervision/Assistance  Patient can return home with the following Help with stairs or ramp for entrance;A little help with walking and/or transfers;A lot of help with bathing/dressing/bathroom;Direct supervision/assist for financial management;Direct supervision/assist for medications management;Assistance with cooking/housework;Assist for transportation    Functional Status  Assessment  Patient has had a recent decline in their functional status and demonstrates the ability to make significant improvements in function in a reasonable and predictable amount of time.  Equipment Recommendations  None recommended by OT    Recommendations for Other Services       Precautions / Restrictions Precautions Precautions: Fall Restrictions Weight Bearing Restrictions: No      Mobility Bed Mobility Overal bed mobility: Needs Assistance Bed Mobility: Supine to Sit, Sit to Supine     Supine to sit: Supervision Sit to supine: Supervision        Transfers Overall transfer level: Needs assistance   Transfers: Sit to/from Stand Sit to Stand: Supervision                  Balance Overall balance assessment: Needs assistance Sitting-balance support: Feet supported Sitting balance-Leahy Scale: Good     Standing balance support: Single extremity supported Standing balance-Leahy Scale: Fair                             ADL either performed or assessed with clinical judgement   ADL Overall ADL's : Needs assistance/impaired Eating/Feeding: Set up;Sitting   Grooming: Wash/dry hands;Wash/dry face;Set up;Sitting   Upper Body Bathing: Set up;Sitting   Lower Body Bathing: Minimal assistance;Sit to/from stand   Upper Body Dressing : Minimal assistance;Sitting   Lower Body Dressing: Moderate assistance;Sit to/from stand   Toilet Transfer: Minimal Holiday representative;Ambulation                   Vision Patient Visual Report: No change from baseline       Perception     Praxis  Pertinent Vitals/Pain Pain Assessment Pain Assessment: No/denies pain     Hand Dominance Right   Extremity/Trunk Assessment Upper Extremity Assessment Upper Extremity Assessment: RUE deficits/detail;LUE deficits/detail RUE Deficits / Details: 3+/5 for MMT RUE Sensation: WNL RUE Coordination: decreased fine motor LUE Deficits /  Details: 3+/5 MMT LUE Sensation: WNL LUE Coordination: decreased fine motor   Lower Extremity Assessment Lower Extremity Assessment: Defer to PT evaluation   Cervical / Trunk Assessment Cervical / Trunk Assessment: Kyphotic   Communication Communication Communication: No difficulties   Cognition Arousal/Alertness: Awake/alert Behavior During Therapy: Flat affect Overall Cognitive Status: Impaired/Different from baseline Area of Impairment: Orientation, Attention, Memory, Following commands, Problem solving                 Orientation Level: Person, Place, Situation Current Attention Level: Selective Memory: Decreased short-term memory Following Commands: Follows one step commands with increased time, Follows multi-step commands inconsistently     Problem Solving: Slow processing, Decreased initiation, Difficulty sequencing, Requires verbal cues       General Comments       Exercises     Shoulder Instructions      Home Living Family/patient expects to be discharged to:: Private residence Living Arrangements: Children Available Help at Discharge: Family;Available PRN/intermittently Type of Home: House Home Access: Stairs to enter [employer] of Steps: 1   Home Layout: One level     Bathroom Shower/Tub: Chief Strategy Officer: Standard Bathroom Accessibility: Yes How Accessible: Accessible via walker Home Equipment: Rolling Walker (2 wheels)          Prior Functioning/Environment Prior Level of Function : Needs assist             Mobility Comments: Increasing mobility issues per the daughter, has a 2WRW, but not using it. ADLs Comments: Per daughter, patient can self feed, generally will perform sink baths on occasion.  Daughter completes iADL and community mobility.        OT Problem List: Decreased strength;Impaired balance (sitting and/or standing);Decreased safety awareness;Decreased cognition;Decreased coordination       OT Treatment/Interventions: Self-care/ADL training;Therapeutic activities;Cognitive remediation/compensation;Patient/family education;Balance training    OT Goals(Current goals can be found in the care plan section) Acute Rehab OT Goals Patient Stated Goal: Hoping to transition to a local ALF OT Goal Formulation: With patient Time For Goal Achievement: 05/13/23 Potential to Achieve Goals: Fair ADL Goals Pt Will Perform Grooming: standing;with modified independence Pt Will Perform Lower Body Dressing: with supervision;sit to/from stand Pt Will Transfer to Toilet: with modified independence;ambulating;regular height toilet Additional ADL Goal #1: Patient will follow multi step commands with Min VC's  OT Frequency: Min 1X/week    Co-evaluation              AM-PAC OT "6 Clicks" Daily Activity     Outcome Measure Help from another person eating meals?: None Help from another person taking care of personal grooming?: A Little Help from another person toileting, which includes using toliet, bedpan, or urinal?: A Little Help from another person bathing (including washing, rinsing, drying)?: A Little Help from another person to put on and taking off regular upper body clothing?: A Little Help from another person to put on and taking off regular lower body clothing?: A Lot 6 Click Score: 18   End of Session Equipment Utilized During Treatment: Gait belt Nurse Communication: Mobility status  Activity Tolerance: Patient tolerated treatment well Patient left: in bed;with call bell/phone within reach;with family/visitor present  OT Visit Diagnosis: Unsteadiness on  feet (R26.81);Muscle weakness (generalized) (M62.81);History of falling (Z91.81);Other symptoms and signs involving cognitive function                Time: 1610-9604 OT Time Calculation (min): 21 min Charges:  OT General Charges $OT Visit: 1 Visit OT Evaluation $OT Eval Moderate Complexity: 1 Mod  04/29/2023  RP,  OTR/L  Acute Rehabilitation Services  Office:  [phone]   Suzanna Obey 04/29/2023, 4:54 PM

## 2023-04-29 NOTE — ED Provider Notes (Signed)
Behavioral Health Urgent Care Medical Screening Exam  Patient Name: Jaclyn [name] MRN: [id number] Date of Evaluation: 04/29/23 Chief Complaint:  "I don't want to live with my daughter" Diagnosis:  Final diagnoses:  Adjustment disorder with other symptoms  Passive suicidal ideations    Jaclyn [name] 67 y.o., female patient presented to [facility] as a walk in  accompanied by by her daughter,  Jaclyn [name], with complaints of wanting to move into an assisted living facility and no longer live with daughter.   Jaclyn [name], 89 y.o., female depressive disorder and bipolar disorder, female patient seen face to face by this provider, consulted with Dr. Lucianne Muss; and chart reviewed on 04/29/23.   Patient and daughter were here last week for the same matter.  Patient is also endorsing passive suicidal ideations however reports that this writer that she does not have a plan and is only suicidal as she wishes to move today into an assisted living facility.  According to daughter she has obtained an FL 2 from patient's PCP and has contacted to assisted living facilities however 1 did not have bed availability and the other 1 patient worked up by a neurologist to determine the reason for the progressive decline in physical mobility.  PCP has referred patient to neurology and for genetic testing as family has a history of Huntington's disease.  Patient is followed by a provider who prescribes psychiatric medications over the last week patient has not been taking her psychiatric meds.  According to daughter she and her mother have lived in the same household all of their lives and in January 2024 daughter reports that she started a new relationship and that is when the animosity between she and her mother began.  In additio to the new relationship daughter also has a new job and is also wanting patient placed into a assisted living facility.  She was hoping to find somewhere that could assist with placement.  She is also  taking patient over to social services who referred her to mobile crisis and mobile crisis referred them back here.  This Clinical research associate informed patient and daughter that no facility exists in which patient can be left while awaiting  assisted living placement.  The process of locating a facility has to be handled by patient or family.  During evaluation Jaclyn [name] is sitting  in no acute distress.  She is alert, oriented x 4, calm, cooperative and attentive.  Her mood is dysphoric with congruent affect.  She has normal speech, and behavior.  Objectively there is no evidence of psychosis/mania or delusional thinking.  Patient is able to converse coherently, goal directed thoughts, no distractibility, or pre-occupation. She reports conditional suicidal thoughts in that is she could move out of her daughter home into an assisted living facility, then she would not be suicidal.    Flowsheet Row ED from 04/29/2023 in [facility] Emergency Department at [facility] Most recent reading at 04/29/2023 11:44 AM ED from 04/29/2023 in [facility] Most recent reading at 04/29/2023 10:34 AM ED from 04/22/2023 in [facility] Most recent reading at 04/22/2023 12:24 PM  C-SSRS RISK CATEGORY Low Risk Low Risk High Risk       Psychiatric Specialty Exam  Presentation  General Appearance:Fairly Groomed  Eye Contact:Fair  Speech:Clear and Coherent  Speech Volume:Normal  Handedness:Right   Mood and Affect  Mood: Dysphoric  Affect: Congruent   Thought Process  Thought Processes: Coherent;  Goal Directed  Descriptions of Associations:Intact  Orientation:Full (Time, Place and Person)  Thought Content:Logical  Diagnosis of Schizophrenia or Schizoaffective disorder in past: No data recorded  Hallucinations:None  Ideas of Reference:None  Suicidal Thoughts:Yes, Passive Without Intent; Without Plan (Suicidal because she doesn't want to live with  daughter anyone)  Homicidal Thoughts:No   Sensorium  Memory: Immediate Good; Recent Good; Remote Good  Judgment: Fair  Insight: Fair   Chartered certified accountant: Fair  Attention Span: Fair  Recall: [employer] of Knowledge: Fair  Language: Fair   Psychomotor Activity  Psychomotor Activity: Other (comment) (Impaired gait requires mobility aid currently in whe[place]air and awaiting to establish with new neurologist)   Assets  Assets: Communication Skills; Desire for Improvement; Financial Resources/Insurance; Housing; Transportation   Sleep  Sleep: Good  Number of hours: No data recorded  Physical Exam: Physical Exam Constitutional:      Appearance: Normal appearance.     Comments: Chronically ill appearing   HENT:     Head: Normocephalic.  Eyes:     Extraocular Movements: Extraocular movements intact.     Pupils: Pupils are equal, round, and reactive to light.  Cardiovascular:     Rate and Rhythm: Normal rate and regular rhythm.  Pulmonary:     Effort: Pulmonary effort is normal.     Breath sounds: Normal breath sounds.  Musculoskeletal:     Cervical back: Normal range of motion.  Skin:    General: Skin is warm.  Neurological:     General: No focal deficit present.     Mental Status: She is alert.     Comments: Generalized weakness ( in wheelchair)      Review of Systems  Psychiatric/Behavioral:  Positive for depression and suicidal ideas.        Passive, conditional SI without a plan       Blood pressure (!) 157/79, pulse (!) 108, temperature 98.2 F (36.8 C), temperature source Oral, resp. rate 18, SpO2 95 %. There is no height or weight on file to calculate BMI.    [facility] MSE Discharge Disposition for Follow up and Recommendations: Based on my evaluation the patient does not appear to have an emergency medical condition and can be discharged with resources and follow up care in outpatient services for I did no assisted living  facility resources were provided for surrounding areas of [place] and [place].  Also provided information for senior day programs as patient admitted that she is bored with staying in the house during the week by herself and encouraged daughter to assist with finding some respite and day programs in order for patient to engage with other individuals in a social setting that are her age.  Precautions given to go to the ED if any safety concerns develop as patient has significant mobility concerns which would place her at risk for falls and safety in the current setting of the crisis center.  Advised to keep follow-up with Neurologist and Primary Care Provider.  Joaquin Courts, NP 04/29/2023, 1:49 PM

## 2023-04-30 LAB — CBG MONITORING, ED
Glucose-Capillary: 159 mg/dL — ABNORMAL HIGH (ref 70–99)
Glucose-Capillary: 151 mg/dL — ABNORMAL HIGH (ref 70–99)
Glucose-Capillary: 122 mg/dL — ABNORMAL HIGH (ref 70–99)
Glucose-Capillary: 82 mg/dL (ref 70–99)

## 2023-04-30 NOTE — Evaluation (Signed)
Physical Therapy Evaluation Patient Details Name: Jaclyn [name] MRN: [id number] DOB: 1956-02-08 Today's Date: 04/30/2023  History of Present Illness  67 y.o. female adm 5/2 after being seen behavioral health urgent care.  Has been diagnosed with adjustment disorder with passive suicidal ideations.  Referral sent to Neuro for worup of possible Huntington's Disease. PMH: DM, HTN, bipolar.  Clinical Impression  Pt admitted with above diagnosis. Pt ambulated 140' with min/guard assist for safety 2* h/o multiple falls and weakness. Pt would benefit from 24/7 supervision due to risk for falls.  Pt currently with functional limitations due to the deficits listed below (see PT Problem List). Pt will benefit from acute skilled PT to increase their independence and safety with mobility to allow discharge.          Recommendations for follow up therapy are one component of a multi-disciplinary discharge planning process, led by the attending physician.  Recommendations may be updated based on patient status, additional functional criteria and insurance authorization.  Follow Up Recommendations Can patient physically be transported by private vehicle: Yes     Assistance Recommended at Discharge Intermittent Supervision/Assistance  Patient can return home with the following  A little help with walking and/or transfers;A little help with bathing/dressing/bathroom;Assistance with cooking/housework;Assist for transportation;Help with stairs or ramp for entrance    Equipment Recommendations None recommended by PT  Recommendations for Other Services       Functional Status Assessment Patient has had a recent decline in their functional status and demonstrates the ability to make significant improvements in function in a reasonable and predictable amount of time.     Precautions / Restrictions Precautions Precautions: Fall Precaution Comments: pt reports multiple falls in past 6  months Restrictions Weight Bearing Restrictions: No      Mobility  Bed Mobility               General bed mobility comments: sitting up at EOB eating breakfast    Transfers Overall transfer level: Needs assistance Equipment used: Rolling walker (2 wheels) Transfers: Sit to/from Stand Sit to Stand: Supervision           General transfer comment: no physical assist needed, VCs for safe hand placement, uncontrolled descent to recliner    Ambulation/Gait Ambulation/Gait assistance: Min guard Gait Distance (Feet): 140 Feet Assistive device: Rolling walker (2 wheels) Gait Pattern/deviations: Decreased stride length Gait velocity: decr     General Gait Details: no loss of balance, appeared to freeze x 2 but able to restart  Stairs            Wheelchair Mobility    Modified Rankin (Stroke Patients Only)       Balance Overall balance assessment: Needs assistance Sitting-balance support: Feet supported Sitting balance-Leahy Scale: Good     Standing balance support: Bilateral upper extremity supported, During functional activity, Reliant on assistive device for balance Standing balance-Leahy Scale: Fair                               Pertinent Vitals/Pain Pain Assessment Pain Assessment: No/denies pain    Home Living Family/patient expects to be discharged to:: Private residence Living Arrangements: Children Available Help at Discharge: Family;Available PRN/intermittently Type of Home: House Home Access: Stairs to enter Entrance Stairs-Rails: None Entrance Stairs-Number of Steps: 1   Home Layout: One level Home Equipment: Agricultural consultant (2 wheels) Additional Comments: lives with daughter who works 10 hr days in [place] so pt  is home alone much of the time    Prior Function Prior Level of Function : Needs assist             Mobility Comments: Increasing mobility issues per the daughter, has a RW which she recently started  using. Pt reports multiple falls in past 6 months, requires assist for going up/down 1 step to enter home. ADLs Comments: Per daughter, patient can self feed, generally will perform sink baths on occasion.  Daughter completes iADL and assists with community mobility.     Hand Dominance   Dominant Hand: Right    Extremity/Trunk Assessment   Upper Extremity Assessment Upper Extremity Assessment: Defer to OT evaluation    Lower Extremity Assessment Lower Extremity Assessment: Generalized weakness;LLE deficits/detail;RLE deficits/detail RLE Deficits / Details: 4/5 hip flexion, knee ext and ankle DF RLE Sensation: WNL LLE Deficits / Details: 4/5 hip flexion, knee ext and ankle DF LLE Sensation: WNL    Cervical / Trunk Assessment Cervical / Trunk Assessment: Kyphotic  Communication   Communication: No difficulties  Cognition Arousal/Alertness: Awake/alert Behavior During Therapy: Flat affect Overall Cognitive Status: No family/caregiver present to determine baseline cognitive functioning                         Following Commands: Follows one step commands with increased time     Problem Solving: Slow processing General Comments: pt is pleasant, follows commands with increased time, increased time to respond to questions but answers appropriately        General Comments      Exercises     Assessment/Plan    PT Assessment Patient needs continued PT services  PT Problem List Decreased balance;Decreased strength;Decreased activity tolerance       PT Treatment Interventions DME instruction;Balance training;Gait training;Therapeutic activities;Therapeutic exercise    PT Goals (Current goals can be found in the Care Plan section)  Acute Rehab PT Goals Patient Stated Goal: improve balance, stop falling PT Goal Formulation: With patient Time For Goal Achievement: 05/14/23 Potential to Achieve Goals: Good    Frequency Min 1X/week     Co-evaluation                AM-PAC PT "6 Clicks" Mobility  Outcome Measure Help needed turning from your back to your side while in a flat bed without using bedrails?: None Help needed moving from lying on your back to sitting on the side of a flat bed without using bedrails?: None Help needed moving to and from a bed to a chair (including a wheelchair)?: A Little Help needed standing up from a chair using your arms (e.g., wheelchair or bedside chair)?: A Little Help needed to walk in hospital room?: A Little Help needed climbing 3-5 steps with a railing? : A Little 6 Click Score: 20    End of Session Equipment Utilized During Treatment: Gait belt Activity Tolerance: Patient tolerated treatment well Patient left: in bed;with nursing/sitter in room;with call bell/phone within reach Nurse Communication: Mobility status PT Visit Diagnosis: Repeated falls (R29.6);Muscle weakness (generalized) (M62.81);Difficulty in walking, not elsewhere classified (R26.2)    Time: 1610-9604 PT Time Calculation (min) (ACUTE ONLY): 14 min   Charges:   PT Evaluation $PT Eval Moderate Complexity: 1 Mod         Tamala Ser PT 04/30/2023  Acute Rehabilitation Services  Office [phone]

## 2023-04-30 NOTE — [place] FL2 (Signed)
[place] MEDICAID FL2 LEVEL OF CARE FORM     IDENTIFICATION  Patient Name: Jaclyn [name] Birthdate: [birth year] Sex: female Admission Date (Current Location): 04/29/2023  [facility] and [place] Number:  Producer, television/film/video and Address:  [facility],  [address] [place] [address]      Provider Number: [id number]  Attending Physician Name and Address:  Alvester Chou, MD  Relative Name and Phone Number:  Randyl, Linwood (Daughter) [phone]    Current Level of Care: Hospital Recommended Level of Care: Skilled Nursing Facility Prior Approval Number:    Date Approved/Denied:   PASRR Number: PENDING  Discharge Plan: SNF    Current Diagnoses: Patient Active Problem List   Diagnosis Date Noted   Hyperlipidemia associated with type 2 diabetes mellitus (HCC) 04/28/2022   Hyperglycemia due to type 2 diabetes mellitus (HCC) 04/28/2022   Bilateral hearing loss 07/15/2020   Temporomandibular joint (TMJ) pain 07/15/2020   Chronic pansinusitis 01/19/2019   Laryngopharyngeal reflux (LPR) 01/19/2019   Perennial allergic rhinitis 01/19/2019   Adjustment disorder with mixed anxiety and depressed mood 04/15/2018   GAD (generalized anxiety disorder) 10/22/2017   Bipolar depression (HCC) 07/07/2013   Drug overdose, multiple drugs 11/09/2012   DM type 2 (diabetes mellitus, type 2) (HCC) 11/09/2012   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 11/09/2012   Hypertension 11/09/2012    Orientation RESPIRATION BLADDER Height & Weight     Self, Time, Situation, Place  Normal Continent Weight: 116 lb 13.5 oz (53 kg) Height:     BEHAVIORAL SYMPTOMS/MOOD NEUROLOGICAL BOWEL NUTRITION STATUS      Continent Diet (Regular)  AMBULATORY STATUS COMMUNICATION OF NEEDS Skin   Limited Assist Verbally Normal                       Personal Care Assistance Level of Assistance  Bathing, Feeding, Dressing Bathing Assistance: Limited assistance Feeding assistance: Independent Dressing  Assistance: Limited assistance     Functional Limitations Info  Sight, Hearing, Speech Sight Info: Adequate Hearing Info: Adequate Speech Info: Adequate    SPECIAL CARE FACTORS FREQUENCY  PT (By licensed PT), OT (By licensed OT)     PT Frequency: x5/week OT Frequency: x5/week            Contractures Contractures Info: Not present    Additional Factors Info  Code Status, Allergies, Psychotropic Code Status Info: Full Allergies Info: Codeine  Flagyl (Metronidazole)  Metronidazole Benzo+syrspend  Morphine And Related  Remeron (Mirtazapine)  Duloxetine Hcl Psychotropic Info: Buspar Risperdal Zoloft         Current Medications (04/30/2023):  This is the current hospital active medication list Current Facility-Administered Medications  Medication Dose Route Frequency Provider Last Rate Last Admin   acetaminophen (TYLENOL) tablet 650 mg  650 mg Oral Q4H PRN Mardene Sayer, MD       alum & mag hydroxide-simeth (MAALOX/MYLANTA) 200-200-20 MG/5ML suspension 30 mL  30 mL Oral Q6H PRN Mardene Sayer, MD       irbesartan (AVAPRO) tablet 300 mg  300 mg Oral Daily Vivien Rossetti C, MD   300 mg at 04/30/23 6440   And   amLODipine (NORVASC) tablet 10 mg  10 mg Oral Daily Vivien Rossetti C, MD   10 mg at 04/30/23 3474   And   hydrochlorothiazide (HYDRODIURIL) tablet 25 mg  25 mg Oral Daily Vivien Rossetti C, MD   25 mg at 04/30/23 0959   busPIRone (BUSPAR) tablet 10 mg  10 mg  Oral TID Mardene Sayer, MD   10 mg at 04/30/23 0951   glipiZIDE (GLUCOTROL) tablet 10 mg  10 mg Oral Q breakfast Vivien Rossetti C, MD   10 mg at 04/30/23 0958   insulin aspart (novoLOG) injection 0-15 Units  0-15 Units Subcutaneous TID WC Mardene Sayer, MD   3 Units at 04/30/23 0959   insulin aspart (novoLOG) injection 0-5 Units  0-5 Units Subcutaneous QHS Vivien Rossetti C, MD       insulin glargine-yfgn [facility]) injection 20 Units  20 Units Subcutaneous QHS Lynden Ang, RPH   20  Units at 04/29/23 2222   metFORMIN (GLUCOPHAGE) tablet 1,000 mg  1,000 mg Oral BID WC Vivien Rossetti C, MD   1,000 mg at 04/30/23 1610   multivitamin with minerals tablet 1 tablet  1 tablet Oral Daily Mardene Sayer, MD   1 tablet at 04/30/23 0954   ondansetron (ZOFRAN) tablet 4 mg  4 mg Oral Q8H PRN Mardene Sayer, MD       pantoprazole (PROTONIX) EC tablet 40 mg  40 mg Oral Daily Vivien Rossetti C, MD   40 mg at 04/30/23 0951   risperiDONE (RISPERDAL) tablet 0.5 mg  0.5 mg Oral q morning Vivien Rossetti C, MD   0.5 mg at 04/30/23 0954   risperiDONE (RISPERDAL) tablet 1 mg  1 mg Oral QHS Vivien Rossetti C, MD   1 mg at 04/29/23 2222   rosuvastatin (CRESTOR) tablet 10 mg  10 mg Oral Daily Vivien Rossetti C, MD   10 mg at 04/30/23 9604   sertraline (ZOLOFT) tablet 200 mg  200 mg Oral Daily Mardene Sayer, MD   200 mg at 04/30/23 5409   Current Outpatient Medications  Medication Sig Dispense Refill   acetaminophen (TYLENOL) 325 MG tablet Take 2 tablets (650 mg total) by mouth every 6 (six) hours as needed for moderate pain. 30 tablet 0   busPIRone (BUSPAR) 10 MG tablet Take 1 tablet (10 mg total) by mouth 3 (three) times daily.     diphenhydrAMINE (BENADRYL) 25 MG tablet Take 25 mg by mouth 2 (two) times daily.     glipiZIDE (GLUCOTROL) 10 MG tablet Take 10 mg by mouth every morning.     LANTUS SOLOSTAR 100 UNIT/ML Solostar Pen Inject 20 Units into the skin at bedtime.     metFORMIN (GLUCOPHAGE) 1000 MG tablet TAKE ONE TABLET BY MOUTH TWICE DAILY WITH  A  MEAL (Patient taking differently: Take 1,000 mg by mouth 2 (two) times daily with a meal.) 180 tablet 3   Multiple Vitamins-Minerals (MULTIVITAMIN WOMEN 50+) TABS Take 1 tablet by mouth daily with breakfast.     Olmesartan-amLODIPine-HCTZ 40-10-25 MG TABS Take 1 tablet by mouth daily. 90 tablet 0   omeprazole (PRILOSEC) 40 MG capsule Take 40 mg by mouth daily before breakfast.     risperiDONE (RISPERDAL) 0.5 MG tablet  TAKE ONE TABLET BY MOUTH TWICE DAILY FOR MOOD  CONTROL. (Patient taking differently: Take 0.5-1 mg by mouth See admin instructions. Take 0.5 mg by mouth in the morning and 1 mg at bedtime) 180 tablet 0   rosuvastatin (CRESTOR) 10 MG tablet Take 10 mg by mouth daily.     sertraline (ZOLOFT) 100 MG tablet Take 200 mg by mouth in the morning.     glucose blood test strip Use to check blood sugar twice daily (Patient taking differently: 1 each by Other route as needed (blood sugar). One touch verio--Use to check blood sugar  twice daily) 100 each 5   Insulin Detemir (LEVEMIR FLEXTOUCH) 100 UNIT/ML Pen Inject 28 Units into the skin daily at 10 pm. (Patient not taking: Reported on 04/29/2023) 9 mL 0   Insulin Pen Needle (BD PEN NEEDLE NANO U/F) 32G X 4 MM MISC Test daily. 100 each 5   meloxicam (MOBIC) 15 MG tablet Take 1 tablet (15 mg total) by mouth daily. (Patient not taking: Reported on 04/29/2023) 30 tablet 0     Discharge Medications: Please see discharge summary for a list of discharge medications.  Relevant Imaging Results:  Relevant Lab Results:   Additional Information SSN: [id number]  Princella Ion, LCSW

## 2023-04-30 NOTE — ED Notes (Signed)
Daughter called and message left on answer machine that pt belongings had been left in pt room.

## 2023-04-30 NOTE — ED Provider Notes (Signed)
Emergency Medicine Observation Re-evaluation Note  Jaclyn [name] is a 66 y.o. female, seen on rounds today.  Pt initially presented to the ED for complaints of weakness  Hx of adjustment disorder and passive SI  Patient had also been seen at [facility] yesterday for "SI without a plan" and medically cleared for outpatient psychiatric follow up  Patient medically cleared by ED team yesterday with labs and MRI imaging, without acute emergent findings  Pending PT evaluation this morning, seen by OT [facility] consult requested for placement  Physical Exam  BP 139/70 (BP Location: Right Arm)   Pulse 73   Temp 98.6 F (37 C) (Oral)   Resp 18   Wt 53 kg   SpO2 98%   BMI 20.70 kg/m  Physical Exam General: NAD Cardiac: Regular HR Lungs: No respiratory distress  ED Course / MDM  EKG:   I have reviewed the labs performed to date as well as medications administered while in observation.    Plan  Current plan is for PT eval, TOC eval for placement     Terald Sleeper, MD 04/30/23 [id number]

## 2023-04-30 NOTE — ED Provider Notes (Signed)
Family is here to pick patient up.  Vital signs stable.   Pricilla Loveless, MD 04/30/23 2007

## 2023-04-30 NOTE — Progress Notes (Signed)
30 Day PASRR Note   Patient Details  Name: Jaclyn [name] Date of Birth: Jun 07, 1956   Transition of Care [facility]) CM/SW Contact:    Princella Ion, LCSW Phone Number: 04/30/2023, 10:53 AM  To Whom It May Concern:  Please be advised that this patient will require a short-term nursing home stay - anticipated 30 days or less for rehabilitation and strengthening.   The plan is for return home.

## 2023-04-30 NOTE — ED Notes (Signed)
Patient has been alert this shift.  Flat affect. Guarded.  Patient quiet. Patient medication compliant.  Patient denied SI. Patient denied hallucinations. Patient can ambulate with walker and stand by assist. Patient needs assist with ADLs.

## 2023-04-30 NOTE — Progress Notes (Signed)
At this time the patient has been denied by several facilites. This CSW has reached out to the daughter to inform her that the patient will need to DC home. The daughter reports that she will pick the patient up at 8:30 tonight. This CSW did let the nurse know as well. TOC will help to make sure DC is arranged.

## 2023-04-30 NOTE — Progress Notes (Addendum)
Bed offers pending. PASRR pending.   Addend @ 11:31 AM  [id number]  Addend @ 12:51 PM Awaiting review from [facility] facilities.   Addend @ 1:05 PM  Spoke with daughter to inform there are no bed offers currently. Daughter has asked that search be expanded. This CSW will fax pt out to remaining SNFs [place]. Informed daughter that if pt does not receive offers within a couple hours, she will have to discharge home.

## 2023-05-01 ENCOUNTER — Emergency Department (HOSPITAL_COMMUNITY)
Admission: EM | Admit: 2023-05-01 | Discharge: 2023-05-03 | Disposition: A | Discharge status: Home / Self Care | Payer: Medicare Other | Attending: Emergency Medicine | Admitting: Emergency Medicine

## 2023-05-01 ENCOUNTER — Other Ambulatory Visit: Payer: Self-pay

## 2023-05-01 ENCOUNTER — Encounter (HOSPITAL_COMMUNITY): Payer: Self-pay

## 2023-05-01 DIAGNOSIS — I10 Essential (primary) hypertension: Secondary | ICD-10-CM | POA: Insufficient documentation

## 2023-05-01 DIAGNOSIS — Z794 Long term (current) use of insulin: Secondary | ICD-10-CM | POA: Insufficient documentation

## 2023-05-01 DIAGNOSIS — Z7984 Long term (current) use of oral hypoglycemic drugs: Secondary | ICD-10-CM | POA: Insufficient documentation

## 2023-05-01 DIAGNOSIS — R5381 Other malaise: Secondary | ICD-10-CM

## 2023-05-01 DIAGNOSIS — F332 Major depressive disorder, recurrent severe without psychotic features: Secondary | ICD-10-CM | POA: Diagnosis not present

## 2023-05-01 DIAGNOSIS — F4323 Adjustment disorder with mixed anxiety and depressed mood: Secondary | ICD-10-CM | POA: Diagnosis present

## 2023-05-01 DIAGNOSIS — R45851 Suicidal ideations: Secondary | ICD-10-CM

## 2023-05-01 DIAGNOSIS — E119 Type 2 diabetes mellitus without complications: Secondary | ICD-10-CM | POA: Diagnosis not present

## 2023-05-01 DIAGNOSIS — R000 Tachycardia, unspecified: Secondary | ICD-10-CM | POA: Diagnosis not present

## 2023-05-01 DIAGNOSIS — Z79899 Other long term (current) drug therapy: Secondary | ICD-10-CM | POA: Insufficient documentation

## 2023-05-01 LAB — COMPREHENSIVE METABOLIC PANEL
ALT: 14 U/L (ref 0–44)
AST: 18 U/L (ref 15–41)
Albumin: 4.1 g/dL (ref 3.5–5.0)
Alkaline Phosphatase: 64 U/L (ref 38–126)
Anion gap: 13 (ref 5–15)
BUN: 21 mg/dL (ref 8–23)
CO2: 27 mmol/L (ref 22–32)
Calcium: 9.5 mg/dL (ref 8.9–10.3)
Chloride: 97 mmol/L — ABNORMAL LOW (ref 98–111)
Creatinine, Ser: 1.23 mg/dL — ABNORMAL HIGH (ref 0.44–1.00)
GFR, Estimated: 49 mL/min — ABNORMAL LOW (ref >60)
Glucose, Bld: 204 mg/dL — ABNORMAL HIGH (ref 70–99)
Potassium: 3.7 mmol/L (ref 3.5–5.1)
Sodium: 137 mmol/L (ref 135–145)
Total Bilirubin: 0.5 mg/dL (ref 0.3–1.2)
Total Protein: 7.3 g/dL (ref 6.5–8.1)

## 2023-05-01 LAB — CBC
HCT: 34.4 % — ABNORMAL LOW (ref 36.0–46.0)
Hemoglobin: 11.1 g/dL — ABNORMAL LOW (ref 12.0–15.0)
MCH: 28.1 pg (ref 26.0–34.0)
MCHC: 32.3 g/dL (ref 30.0–36.0)
MCV: 87.1 fL (ref 80.0–100.0)
Platelets: 296 10*3/uL (ref 150–400)
RBC: 3.95 MIL/uL (ref 3.87–5.11)
RDW: 13.4 % (ref 11.5–15.5)
WBC: 10.6 10*3/uL — ABNORMAL HIGH (ref 4.0–10.5)
nRBC: 0 % (ref 0.0–0.2)

## 2023-05-01 LAB — SALICYLATE LEVEL: Salicylate Lvl: <7 mg/dL — ABNORMAL LOW (ref 7.0–30.0)

## 2023-05-01 LAB — ACETAMINOPHEN LEVEL: Acetaminophen (Tylenol), Serum: <10 ug/mL — ABNORMAL LOW (ref 10–30)

## 2023-05-01 LAB — CBG MONITORING, ED: Glucose-Capillary: 177 mg/dL — ABNORMAL HIGH (ref 70–99)

## 2023-05-01 LAB — ETHANOL: Alcohol, Ethyl (B): <10 mg/dL (ref <10)

## 2023-05-01 MED ORDER — METFORMIN HCL 500 MG PO TABS
1000.0000 mg | ORAL_TABLET | Freq: Two times a day (BID) | ORAL | Status: DC
  Administered 2023-05-02 (×2): 1000 mg via ORAL
  Filled 2023-05-01 (×3): qty 2

## 2023-05-01 MED ORDER — ACETAMINOPHEN 325 MG PO TABS: 650.0000 mg | ORAL_TABLET | ORAL | Status: DC | PRN

## 2023-05-01 MED ORDER — GLIPIZIDE 10 MG PO TABS
10.0000 mg | ORAL_TABLET | Freq: Every day | ORAL | Status: DC
  Administered 2023-05-02: 10 mg via ORAL
  Filled 2023-05-01 (×3): qty 1

## 2023-05-01 MED ORDER — AMLODIPINE BESYLATE 5 MG PO TABS
10.0000 mg | ORAL_TABLET | Freq: Every day | ORAL | Status: DC
  Administered 2023-05-02 – 2023-05-03 (×2): 10 mg via ORAL
  Filled 2023-05-01 (×2): qty 2

## 2023-05-01 MED ORDER — MELATONIN 3 MG PO TABS
3.0000 mg | ORAL_TABLET | Freq: Every day | ORAL | Status: DC
  Administered 2023-05-01 – 2023-05-02 (×2): 3 mg via ORAL
  Filled 2023-05-01 (×2): qty 1

## 2023-05-01 MED ORDER — ONDANSETRON HCL 4 MG PO TABS: 4.0000 mg | ORAL_TABLET | Freq: Three times a day (TID) | ORAL | Status: DC | PRN

## 2023-05-01 MED ORDER — INSULIN DETEMIR 100 UNIT/ML ~~LOC~~ SOLN
28.0000 [IU] | Freq: Every day | SUBCUTANEOUS | Status: DC
  Administered 2023-05-01 – 2023-05-02 (×2): 28 [IU] via SUBCUTANEOUS
  Filled 2023-05-01 (×3): qty 0.28

## 2023-05-01 MED ORDER — RISPERIDONE 1 MG PO TABS
1.0000 mg | ORAL_TABLET | Freq: Every day | ORAL | Status: DC
  Administered 2023-05-01 – 2023-05-02 (×2): 1 mg via ORAL
  Filled 2023-05-01 (×2): qty 1

## 2023-05-01 MED ORDER — RISPERIDONE 1 MG PO TABS
0.5000 mg | ORAL_TABLET | Freq: Every day | ORAL | Status: DC
  Administered 2023-05-02 – 2023-05-03 (×2): 0.5 mg via ORAL
  Filled 2023-05-01 (×2): qty 1

## 2023-05-01 MED ORDER — BUSPIRONE HCL 10 MG PO TABS
10.0000 mg | ORAL_TABLET | Freq: Three times a day (TID) | ORAL | Status: DC
  Administered 2023-05-01 – 2023-05-03 (×5): 10 mg via ORAL
  Filled 2023-05-01 (×5): qty 1

## 2023-05-01 MED ORDER — PANTOPRAZOLE SODIUM 40 MG PO TBEC
40.0000 mg | DELAYED_RELEASE_TABLET | Freq: Every day | ORAL | Status: DC
  Administered 2023-05-01 – 2023-05-03 (×3): 40 mg via ORAL
  Filled 2023-05-01 (×3): qty 1

## 2023-05-01 MED ORDER — SERTRALINE HCL 100 MG PO TABS
100.0000 mg | ORAL_TABLET | Freq: Every day | ORAL | Status: DC
  Administered 2023-05-02 – 2023-05-03 (×2): 100 mg via ORAL
  Filled 2023-05-01 (×2): qty 1

## 2023-05-01 MED ORDER — ROSUVASTATIN CALCIUM 5 MG PO TABS
10.0000 mg | ORAL_TABLET | Freq: Every day | ORAL | Status: DC
  Administered 2023-05-02 – 2023-05-03 (×2): 10 mg via ORAL
  Filled 2023-05-01 (×2): qty 2

## 2023-05-01 MED ORDER — HYDROCHLOROTHIAZIDE 25 MG PO TABS
25.0000 mg | ORAL_TABLET | Freq: Every day | ORAL | Status: DC
  Administered 2023-05-02 – 2023-05-03 (×2): 25 mg via ORAL
  Filled 2023-05-01 (×2): qty 1

## 2023-05-01 NOTE — ED Provider Notes (Signed)
[facility] EMERGENCY DEPARTMENT AT [facility] Provider Note   CSN: [id number] Arrival date & time: 05/01/23  1410     History  Chief Complaint  Patient presents with   Suicidal    Jaclyn [name] is a 67 y.o. female with T2DM, HLD, HTN, TMJ pain, chronic pansinusitis, GAD, bipolar depression, history of drug overdose, who presents with suicidal ideation.   Per chart review patient was just discharged from here yesterday after presenting on 04/29/2023 for and wanting to move into an assisted living facility and no longer wanting to live with her daughter.  At that time endorsed passive suicidal ideation with no plan.  Was being worked up for Huntington's disease.  Had MRI brain with and without contrast that showed chronic lacunar infarcts with no acute findings.  Was awaiting TOC placement, patient was denied by several facilities.  Social work discharge patient back home with her daughter.  Pt arrived POV from home c/o SI with a plan to overdose on pills. Per family member pt cannot take care of themselves at home anymore as well. Endorses poor PO intake as a result. She says she attempted suicide many years ago by overdosing on medications. Hasn't taken anything in overdose yesterday or today. She denies homicidal ideation or history of aggression. She denies auditory or visual hallucinations. She denies any problems with alcohol or other substances. She is having difficulty caring for herself and her daughter has a new job and cannot care for her 24/7.   HPI     Home Medications Prior to Admission medications   Medication Sig Start Date End Date Taking? Authorizing Provider  acetaminophen (TYLENOL) 325 MG tablet Take 2 tablets (650 mg total) by mouth every 6 (six) hours as needed for moderate pain. 07/28/22   Wallis Bamberg, PA-C  busPIRone (BUSPAR) 10 MG tablet Take 1 tablet (10 mg total) by mouth 3 (three) times daily. 03/11/21   Estella Husk, MD  diphenhydrAMINE (BENADRYL)  25 MG tablet Take 25 mg by mouth 2 (two) times daily.    [provider]  glipiZIDE (GLUCOTROL) 10 MG tablet Take 10 mg by mouth every morning. 12/07/21   [provider]  glucose blood test strip Use to check blood sugar twice daily Patient taking differently: 1 each by Other route as needed (blood sugar). One touch verio--Use to check blood sugar twice daily 02/27/15   Worthy Rancher B, FNP  Insulin Detemir (LEVEMIR FLEXTOUCH) 100 UNIT/ML Pen Inject 28 Units into the skin daily at 10 pm. Patient not taking: Reported on 04/29/2023 07/04/15   Terressa Koyanagi, DO  Insulin Pen Needle (BD PEN NEEDLE NANO U/F) 32G X 4 MM MISC Test daily. 09/11/14   Toniann Ket, PA-C  LANTUS SOLOSTAR 100 UNIT/ML Solostar Pen Inject 20 Units into the skin at bedtime.    [provider]  meloxicam (MOBIC) 15 MG tablet Take 1 tablet (15 mg total) by mouth daily. Patient not taking: Reported on 04/29/2023 03/30/23   Madelyn Brunner, DO  metFORMIN (GLUCOPHAGE) 1000 MG tablet TAKE ONE TABLET BY MOUTH TWICE DAILY WITH  A  MEAL Patient taking differently: Take 1,000 mg by mouth 2 (two) times daily with a meal. 04/06/15   Terressa Koyanagi, DO  Multiple Vitamins-Minerals (MULTIVITAMIN WOMEN 50+) TABS Take 1 tablet by mouth daily with breakfast.    [provider]  Olmesartan-amLODIPine-HCTZ 40-10-25 MG TABS Take 1 tablet by mouth daily. 04/06/23   Rollene Rotunda, MD  omeprazole (PRILOSEC)  40 MG capsule Take 40 mg by mouth daily before breakfast. 01/01/20   [provider]  risperiDONE (RISPERDAL) 0.5 MG tablet TAKE ONE TABLET BY MOUTH TWICE DAILY FOR MOOD  CONTROL. Patient taking differently: Take 0.5-1 mg by mouth See admin instructions. Take 0.5 mg by mouth in the morning and 1 mg at bedtime 05/24/15   Worthy Rancher B, FNP  rosuvastatin (CRESTOR) 10 MG tablet Take 10 mg by mouth daily. 12/09/21   [provider]  sertraline (ZOLOFT) 100 MG tablet Take 200 mg by mouth in the morning.     [provider]      Allergies    Codeine, Flagyl [metronidazole], Metronidazole benzo+syrspend, Morphine and related, Remeron [mirtazapine], and Duloxetine hcl    Review of Systems   Review of Systems Review of systems Negative for physical pain, f/c.  A 10 point review of systems was performed and is negative unless otherwise reported in HPI.  Physical Exam Updated Vital Signs BP 129/60 (BP Location: Left Arm)   Pulse (!) 109   Temp 98.8 F (37.1 C) (Oral)   Resp 18   SpO2 98%  Physical Exam General: Normal appearing female, lying in bed.  HEENT: Sclera anicteric, MMM, trachea midline.  Cardiology: RRR, no murmurs/rubs/gallops. BL radial and DP pulses equal bilaterally.  Resp: Normal respiratory rate and effort. CTAB, no wheezes, rhonchi, crackles.  Abd: Soft, non-tender, non-distended. No rebound tenderness or guarding.  GU: Deferred. MSK: No peripheral edema or signs of trauma. Extremities without deformity or TTP. No cyanosis or clubbing. Skin: warm, dry. No rashes or lesions. Back: No CVA tenderness Neuro: A&Ox4, CNs II-XII grossly intact. MAEs. Sensation grossly intact.  Psych: tearful mood/affect  ED Results / Procedures / Treatments   Labs (all labs ordered are listed, but only abnormal results are displayed) Labs Reviewed  CBC - Abnormal; Notable for the following components:      Result Value   WBC 10.6 (*)    Hemoglobin 11.1 (*)    HCT 34.4 (*)    All other components within normal limits  COMPREHENSIVE METABOLIC PANEL  ETHANOL  SALICYLATE LEVEL  ACETAMINOPHEN LEVEL  RAPID URINE DRUG SCREEN, HOSP PERFORMED    EKG EKG Interpretation  Date/Time:  Saturday May 01 2023 15:08:57 EDT Ventricular Rate:  106 PR Interval:  134 QRS Duration: 64 QT Interval:  346 QTC Calculation: 459 R Axis:   76 Text Interpretation: Sinus tachycardia Nonspecific ST abnormality Confirmed by Vivi Barrack [phone]) on 05/01/2023 4:54:14 PM  Radiology No results  found.  Procedures Procedures    Medications Ordered in ED Medications - No data to display  ED Course/ Medical Decision Making/ A&P                          Medical Decision Making Amount and/or Complexity of Data Reviewed Labs: ordered. Decision-making details documented in ED Course.    This patient presents to the ED for concern of suicidality; this involves an extensive number of treatment options, and is a complaint that carries with it a high risk of complications and morbidity.  I considered the following differential and admission for this acute, potentially life threatening condition.   MDM:    Patient with suicidal ideation and inability to care for herself at home. Had just been evaluated for ALF but DC'd to ome yesterday. Patient states she cannot care for herself and still has SI w/ plan to OD on pills, which she has  a history of in the past. Consider electrolyte deranagements, anemia, infectious processes such as UTI, or toxic ingestion such as ASA/tylenol though patient denies taking pills today.   Will obtain labs to medically clear and then consult to TTS.  Clinical Course as of 05/07/23 0905  Sat May 01, 2023  1653 Creatinine(!): 1.23 +AKI, relays poor PO intake [HN]  1653 WBC(!): 10.6 C/w 2 days ago, very mild leukocytosis [HN]  1653 Acetaminophen (Tylenol), S(!): <10 [HN]  1653 Salicylate Lvl(!): <7.0 [HN]  1653 Alcohol, Ethyl (B): <10 [HN]  1823 Medically cleared pending TTS [HN]  2146 Home meds including insulin and CBG monitoring ordered [HN]    Clinical Course User Index [HN] Loetta Rough, MD    Labs: I Ordered, and personally interpreted labs.  The pertinent results include:  thosel isted above  Additional history obtained from chart review, daughter at bedside.    Social Determinants of Health: currently lives with daughter  Disposition:  pending TTS  Co morbidities that complicate the patient evaluation  Past Medical History:   Diagnosis Date   Abnormal EKG 11/09/2012   Arthritis    Delirium, drug-induced 11/10/2012   Depression    Ear pressure, bilateral 07/15/2020   Educated about COVID-19 virus infection 04/24/2019   Eustachian tube dysfunction, bilateral 01/19/2019   Gastritis 04/28/2022   Hypertension    Referred otalgia of both ears 01/19/2019   Suicidal behavior 11/09/2012     Medicines No orders of the defined types were placed in this encounter.   I have reviewed the patients home medicines and have made adjustments as needed  Problem List / ED Course: Problem List Items Addressed This Visit   None Visit Diagnoses     Suicidal ideation    -  Primary   Physical deconditioning                       This note was created using dictation software, which may contain spelling or grammatical errors.    Loetta Rough, MD 05/20/23 [phone]

## 2023-05-01 NOTE — ED Triage Notes (Signed)
Pt arrived POV from home c/o SI with a plan to overdose on pills. Per family member pt cannot take care of themselves at home anymore as well.

## 2023-05-01 NOTE — ED Notes (Signed)
This is a Voluntary PT, Face sheet in orange zone
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
## 2023-05-01 NOTE — ED Notes (Signed)
TTS in process-Monique,RN  

## 2023-05-01 NOTE — ED Notes (Signed)
Patient has been wanded by security-Monique,RN

## 2023-05-01 NOTE — ED Notes (Signed)
Sitter and daughter at bedside

## 2023-05-02 LAB — CBG MONITORING, ED
Glucose-Capillary: 115 mg/dL — ABNORMAL HIGH (ref 70–99)
Glucose-Capillary: 100 mg/dL — ABNORMAL HIGH (ref 70–99)

## 2023-05-02 NOTE — ED Provider Notes (Signed)
Emergency Medicine Observation Re-evaluation Note  Jaclyn [name] is a 67 y.o. female, seen on rounds today.  Pt initially presented to the ED for complaints of Suicidal Currently, the patient is resting comfortably.  Physical Exam  BP (!) 158/70 (BP Location: Right Arm)   Pulse 91   Temp 98.8 F (37.1 C)   Resp 18   SpO2 97%  Physical Exam General: No acute distress Cardiac: Regular rate  Lungs: No respiratory distress Psych: Calm  ED Course / MDM  EKG:EKG Interpretation  Date/Time:  Saturday May 01 2023 15:08:57 EDT Ventricular Rate:  106 PR Interval:  134 QRS Duration: 64 QT Interval:  346 QTC Calculation: 459 R Axis:   76 Text Interpretation: Sinus tachycardia Nonspecific ST abnormality Confirmed by Vivi Barrack [phone]) on 05/01/2023 4:54:14 PM  I have reviewed the labs performed to date as well as medications administered while in observation.  Recent changes in the last 24 hours include patient needs inpatient psychiatric admission.  Plan  Current plan is for to hold patient for [facility] psych admission.  Patient's vital signs are stable and within normal limits.  Labs are reassuring.    Derwood Kaplan, MD 05/02/23 1434

## 2023-05-02 NOTE — Progress Notes (Signed)
Patient has been denied by [facility] due to no age appropriate beds. Patient meets gero-psych inpatient per Olin Pia, NP. Patient has been faxed out to the following facilities:   [facility]  [address], [place] [address] [phone]  [facility] [address]  [address], [place] [address] [phone]  [facility]  [address] [place] [address] [phone]  [facility]  [address] [place] [address] [phone]  [facility]  [address] [place], [place] [address] [phone]  [facility]  [address], [place] [address] [phone]  [facility]  [address], [place] [address] [phone]  [facility]  [address] [place] [address] [phone]  [facility]  [address] [place] [address] [phone]  [facility]  [address], [place] [address] [phone]  [facility]  [address] [place], [place] [address] [phone]  [facility]  [address], [place] [address] [phone]  [facility]  [address], [place] [address] [phone]  [facility]  [address], [place] [address] [phone]  [facility]  [address], [place] [address] [phone]   Damita Dunnings, MSW, LCSW-A  11:36 AM 05/02/2023

## 2023-05-02 NOTE — ED Notes (Signed)
Pt up to [facility] without assistance.

## 2023-05-02 NOTE — ED Notes (Signed)
Called patient's daughter, Earnesteen Ament, per her request. No answer. Left VM.

## 2023-05-03 DIAGNOSIS — F4323 Adjustment disorder with mixed anxiety and depressed mood: Secondary | ICD-10-CM

## 2023-05-03 LAB — CBG MONITORING, ED
Glucose-Capillary: 59 mg/dL — ABNORMAL LOW (ref 70–99)
Glucose-Capillary: 94 mg/dL (ref 70–99)

## 2023-05-03 NOTE — Progress Notes (Signed)
CSW contacted [place], [phone] patients daughter with no answer. Text message sent to patients daughter.

## 2023-05-03 NOTE — Consult Note (Signed)
BH ED ASSESSMENT   Reason for Consult:  SI Referring Physician:  Rhunette Croft Patient Identification: Jaclyn [name] MRN:  [id number] ED Chief Complaint: Adjustment disorder with mixed anxiety and depressed mood  Diagnosis:  Principal Problem:   Adjustment disorder with mixed anxiety and depressed mood   ED Assessment Time Calculation: Start Time: 1030 Stop Time: 1130 Total Time in Minutes (Assessment Completion): 60  HPI:   Jaclyn [name] is a 67 y.o. female patient with reported history of depression, anxiety, and possibly Huntington's disease.  To [facility], ED reporting suicidal ideation with a plan to overdose on medications.  Patient was recently seen at [facility] on 4/25 and 5/2 for depression due to worsening physical disease and living with her daughter and wanting to be placed in ALF. Per chart review appears daughter is no longer wanting her mother to live with her, she has been difficult to get in touch with since patients arrival to ED.  Subjective:   Patient seen this morning at [facility], ED for face-to-face psychiatric evaluation.  Patient is able to sit up in bed by herself and have a discussion with me.  She is pleasant, calm, and cooperative.  She tells me she is being evaluated for Huntington's disease and has had worsening physical mobility over the past year or so.  She reports living with her daughter for a long time, but now she does not want to live with her daughter anymore and does not feel safe there.  She feels like her daughter does not assist her when she needs it.  She is afraid of falling or hurting herself if she is to be returned to her daughter.  She does tell me she needs assistance with balance when walking and going to the restroom/showering.  The physical mobility limitations have caused some depression, she does report history of depression and anxiety diagnosis.  She does report being compliant with her medications.  She reports sometimes when she is having a really  bad day either arguing with her daughter or her physical mobility she will have passive suicidal ideations.  Her last suicide attempt was many years ago she did try to overdose on medications.  Today she denies any suicidal ideations, states that she wants to live and does not want to kill herself. She realized living with her daughter has caused her worsening depression since she refuses to help her. Being away from her daughter she has realized she can still "enjoy life and get help she needs." She denies any plans or intent.  Denies any access to weapons or firearms. She denies any homicidal ideations.  She denies any auditory or visual hallucinations.  She states her main goal is to get placed in an ALF, she does not want to return to her daughter.  She denies any substance use or alcohol use.  Denies any adverse reactions from current medications.  She does not currently see a therapist but would like to start doing so.  She is able to contract for safety, denies wanting to hurt herself or others.  She does make it clear she does not want to return to her daughter's home, and feels like her quality of life would improve from ALF placement.  She did give me permission to call her daughter for more information.  Attempted to call her daughter Jaclyn [name] at [phone] [phone] response each time/a voicemail was left.  It does not appear patient qualifies for inpatient psychiatric treatment at this time.  ED team was notified of difficulty getting in touch with daughter, a TOC order has been placed to help assist disposition.  No psychotropic medication changes are needed at this time.  She does not meet criteria for inpatient psychiatric treatment.  Will psychiatrically clear at this time.  ED team notified.  Past Psychiatric History:  GAD, MDD  Risk to Self or Others: Is the patient at risk to self? No Has the patient been a risk to self in the past 6 months? No Has the patient been a risk to self within  the distant past? No Is the patient a risk to others? No Has the patient been a risk to others in the past 6 months? No Has the patient been a risk to others within the distant past? No  [place] Scale:  Flowsheet Row ED from 05/01/2023 in [facility] Emergency Department at [facility] Most recent reading at 05/01/2023  2:35 PM ED from 04/29/2023 in [facility] Emergency Department at [facility] Most recent reading at 04/29/2023 11:44 AM ED from 04/29/2023 in [facility] Most recent reading at 04/29/2023 10:34 AM  C-SSRS RISK CATEGORY High Risk Low Risk Low Risk       ASAM: ASAM Multidimensional Assessment Summary Dimension 1:  Description of individual's past and current experiences of substance use and withdrawal: Pt denies history of substance use DImension 1:  Acute Intoxication and/or Withdrawal Potential Severity Rating: None Dimension 2:  Description of patient's biomedical conditions and  complications: Pt denies history of substance use Dimension 2:  Biomedical Conditions and Complications Severity Rating: None Dimension 3:  Description of emotional, behavioral, or cognitive conditions and complications: Pt denies history of substance use Dimension 3:  Emotional, behavioral or cognitive (EBC) conditions and complications severity rating: None Dimension 4:  Description of Readiness to Change criteria: Pt denies history of substance use Dimension 4:  Readiness to Change Severity Rating: None Dimension 5:  Relapse, continued use, or continued problem potential critiera description: Pt denies history of substance use Dimension 5:  Relapse, continued use, or continued problem potential severity rating: None Dimension 6:  Recovery/Iiving environment criteria description: Pt denies history of substance use Dimension 6:  Recovery/living environment severity rating: None ASAM's Severity Rating Score: 0 ASAM Recommended Level of Treatment:   (NA)  Substance Abuse:  Alcohol / Drug Use Pain Medications: See MAR Prescriptions: See MAR Over the Counter: See MAR History of alcohol / drug use?: No history of alcohol / drug abuse Longest period of sobriety (when/how long): NA Negative Consequences of Use:  (NA) Withdrawal Symptoms: None  Past Medical History:  Past Medical History:  Diagnosis Date   Abnormal EKG 11/09/2012   Arthritis    Delirium, drug-induced 11/10/2012   Depression    Ear pressure, bilateral 07/15/2020   Educated about COVID-19 virus infection 04/24/2019   Eustachian tube dysfunction, bilateral 01/19/2019   Gastritis 04/28/2022   Hypertension    Referred otalgia of both ears 01/19/2019   Suicidal behavior 11/09/2012    Past Surgical History:  Procedure Laterality Date   NASAL SEPTUM SURGERY     TUBAL LIGATION     Family History:  Family History  Problem Relation Age of Onset   Huntington's disease Father    Social History:  Social History   Substance and Sexual Activity  Alcohol Use No     Social History   Substance and Sexual Activity  Drug Use No    Social History  Socioeconomic History   Marital status: Single    Spouse name: Not on file   Number of children: 1   Years of education: Not on file   Highest education level: Associate degree: occupational, Scientist, product/process development, or vocational program  Occupational History   Not on file  Tobacco Use   Smoking status: Never   Smokeless tobacco: Never  Vaping Use   Vaping Use: Never used  Substance and Sexual Activity   Alcohol use: No   Drug use: No   Sexual activity: Not Currently  Other Topics Concern   Not on file  Social History Narrative   Lives with daughter   Social Determinants of Health   Financial Resource Strain: Not on file  Food Insecurity: Not on file  Transportation Needs: No Transportation Needs (04/12/2023)   PRAPARE - Administrator, Civil Service (Medical): No    Lack of Transportation (Non-Medical):  No  Physical Activity: Unknown (04/12/2023)   Exercise Vital Sign    Days of Exercise per Week: 0 days    Minutes of Exercise per Session: Not on file  Stress: No Stress Concern Present (04/12/2023)   [employer] of Occupational Health - Occupational Stress Questionnaire    Feeling of Stress : Only a little  Social Connections: Socially Isolated (04/12/2023)   Social Connection and Isolation Panel [NHANES]    Frequency of Communication with Friends and Family: Twice a week    Frequency of Social Gatherings with Friends and Family: Never    Attends Religious Services: Never    Database administrator or Organizations: No    Attends Engineer, structural: Not on file    Marital Status: Divorced   Additional Social History:    Allergies:   Allergies  Allergen Reactions   Codeine Nausea And Vomiting and Other (See Comments)    States that she cannot take in high doses   Flagyl [Metronidazole] Other (See Comments)    Fast heartbeat, "tingly"   Metronidazole Benzo+Syrspend Other (See Comments)    Fast heartbeat   Morphine And Related Nausea And Vomiting and Other (See Comments)    States that she cannot take in high doses   Remeron [Mirtazapine] Other (See Comments)    Severe restlessness, irritability, hyperactivity, agitation   Duloxetine Hcl Rash    Labs:  Results for orders placed or performed during the hospital encounter of 05/01/23 (from the past 48 hour(s))  Comprehensive metabolic panel     Status: Abnormal   Collection Time: 05/01/23  2:44 PM  Result Value Ref Range   Sodium 137 135 - 145 mmol/L   Potassium 3.7 3.5 - 5.1 mmol/L   Chloride 97 (L) 98 - 111 mmol/L   CO2 27 22 - 32 mmol/L   Glucose, Bld 204 (H) 70 - 99 mg/dL    Comment: Glucose reference range applies only to samples taken after fasting for at least 8 hours.   BUN 21 8 - 23 mg/dL   Creatinine, Ser 1.61 (H) 0.44 - 1.00 mg/dL   Calcium 9.5 8.9 - 09.6 mg/dL   Total Protein 7.3 6.5 - 8.1 g/dL    Albumin 4.1 3.5 - 5.0 g/dL   AST 18 15 - 41 U/L   ALT 14 0 - 44 U/L   Alkaline Phosphatase 64 38 - 126 U/L   Total Bilirubin 0.5 0.3 - 1.2 mg/dL   GFR, Estimated 49 (L) >60 mL/min    Comment: (NOTE) Calculated using the CKD-EPI Creatinine Equation (2021)  Anion gap 13 5 - 15    Comment: Performed at [facility], [address], [place], [place] [address]  Ethanol     Status: None   Collection Time: 05/01/23  2:44 PM  Result Value Ref Range   Alcohol, Ethyl (B) <10 <10 mg/dL    Comment: (NOTE) Lowest detectable limit for serum alcohol is 10 mg/dL.  For medical purposes only. Performed at [facility], [address], [place], [place] [address]   Salicylate level     Status: Abnormal   Collection Time: 05/01/23  2:44 PM  Result Value Ref Range   Salicylate Lvl <7.0 (L) 7.0 - 30.0 mg/dL    Comment: Performed at [facility], [address], [place], [place] [address]  Acetaminophen level     Status: Abnormal   Collection Time: 05/01/23  2:44 PM  Result Value Ref Range   Acetaminophen (Tylenol), Serum <10 (L) 10 - 30 ug/mL    Comment: (NOTE) Therapeutic concentrations vary significantly. A range of 10-30 ug/mL  may be an effective concentration for many patients. However, some  are best treated at concentrations outside of this range. Acetaminophen concentrations >150 ug/mL at 4 hours after ingestion  and >50 ug/mL at 12 hours after ingestion are often associated with  toxic reactions.  Performed at [facility], [address] [place], [place] [address]   cbc     Status: Abnormal   Collection Time: 05/01/23  2:44 PM  Result Value Ref Range   WBC 10.6 (H) 4.0 - 10.5 K/uL   RBC 3.95 3.87 - 5.11 MIL/uL   Hemoglobin 11.1 (L) 12.0 - 15.0 g/dL   HCT 65.7 (L) 84.6 - 96.2 %   MCV 87.1 80.0 - 100.0 fL   MCH 28.1 26.0 - 34.0 pg   MCHC 32.3 30.0 - 36.0 g/dL   RDW 95.2 84.1 - 32.4 %   Platelets 296 150 - 400 K/uL   nRBC 0.0 0.0 - 0.2 %     Comment: Performed at [facility], [address], [place], [place] [address]  CBG monitoring, ED     Status: Abnormal   Collection Time: 05/01/23  9:05 PM  Result Value Ref Range   Glucose-Capillary 177 (H) 70 - 99 mg/dL    Comment: Glucose reference range applies only to samples taken after fasting for at least 8 hours.  CBG monitoring, ED     Status: Abnormal   Collection Time: 05/02/23  4:49 PM  Result Value Ref Range   Glucose-Capillary 115 (H) 70 - 99 mg/dL    Comment: Glucose reference range applies only to samples taken after fasting for at least 8 hours.  CBG monitoring, ED     Status: Abnormal   Collection Time: 05/02/23  9:12 PM  Result Value Ref Range   Glucose-Capillary 100 (H) 70 - 99 mg/dL    Comment: Glucose reference range applies only to samples taken after fasting for at least 8 hours.  CBG monitoring, ED     Status: Abnormal   Collection Time: 05/03/23  7:56 AM  Result Value Ref Range   Glucose-Capillary 59 (L) 70 - 99 mg/dL    Comment: Glucose reference range applies only to samples taken after fasting for at least 8 hours.  CBG monitoring, ED     Status: None   Collection Time: 05/03/23  8:59 AM  Result Value Ref Range   Glucose-Capillary 94 70 - 99 mg/dL  Comment: Glucose reference range applies only to samples taken after fasting for at least 8 hours.    Current Facility-Administered Medications  Medication Dose Route Frequency Provider Last Rate Last Admin   acetaminophen (TYLENOL) tablet 650 mg  650 mg Oral Q4H PRN Loetta Rough, MD       amLODipine (NORVASC) tablet 10 mg  10 mg Oral Daily Loetta Rough, MD   10 mg at 05/03/23 0928   busPIRone (BUSPAR) tablet 10 mg  10 mg Oral TID Loetta Rough, MD   10 mg at 05/03/23 0929   glipiZIDE (GLUCOTROL) tablet 10 mg  10 mg Oral QAC breakfast Loetta Rough, MD   10 mg at 05/02/23 0948   hydrochlorothiazide (HYDRODIURIL) tablet 25 mg  25 mg Oral Daily Loetta Rough, MD   25 mg at 05/03/23 0929    insulin detemir (LEVEMIR) injection 28 Units  28 Units Subcutaneous QHS Loetta Rough, MD   28 Units at 05/02/23 2123   melatonin tablet 3 mg  3 mg Oral QHS Loetta Rough, MD   3 mg at 05/02/23 2123   metFORMIN (GLUCOPHAGE) tablet 1,000 mg  1,000 mg Oral BID WC Loetta Rough, MD   1,000 mg at 05/02/23 1650   ondansetron (ZOFRAN) tablet 4 mg  4 mg Oral Q8H PRN Loetta Rough, MD       pantoprazole (PROTONIX) EC tablet 40 mg  40 mg Oral Daily Loetta Rough, MD   40 mg at 05/03/23 0929   risperiDONE (RISPERDAL) tablet 0.5 mg  0.5 mg Oral Daily Loetta Rough, MD   0.5 mg at 05/03/23 0929   risperiDONE (RISPERDAL) tablet 1 mg  1 mg Oral QHS Loetta Rough, MD   1 mg at 05/02/23 2123   rosuvastatin (CRESTOR) tablet 10 mg  10 mg Oral Daily Loetta Rough, MD   10 mg at 05/03/23 1610   sertraline (ZOLOFT) tablet 100 mg  100 mg Oral Daily Loetta Rough, MD   100 mg at 05/03/23 9604   Current Outpatient Medications  Medication Sig Dispense Refill   acetaminophen (TYLENOL) 325 MG tablet Take 2 tablets (650 mg total) by mouth every 6 (six) hours as needed for moderate pain. 30 tablet 0   busPIRone (BUSPAR) 10 MG tablet Take 1 tablet (10 mg total) by mouth 3 (three) times daily.     diphenhydrAMINE (BENADRYL) 25 MG tablet Take 25 mg by mouth 2 (two) times daily.     glipiZIDE (GLUCOTROL) 10 MG tablet Take 10 mg by mouth every morning.     LANTUS SOLOSTAR 100 UNIT/ML Solostar Pen Inject 20 Units into the skin at bedtime.     metFORMIN (GLUCOPHAGE) 1000 MG tablet TAKE ONE TABLET BY MOUTH TWICE DAILY WITH  A  MEAL (Patient taking differently: Take 1,000 mg by mouth 2 (two) times daily with a meal.) 180 tablet 3   Multiple Vitamins-Minerals (MULTIVITAMIN WOMEN 50+) TABS Take 1 tablet by mouth daily with breakfast.     Olmesartan-amLODIPine-HCTZ 40-10-25 MG TABS Take 1 tablet by mouth daily. 90 tablet 0   omeprazole (PRILOSEC) 40 MG capsule Take 40 mg by mouth daily before breakfast.      risperiDONE (RISPERDAL) 0.5 MG tablet TAKE ONE TABLET BY MOUTH TWICE DAILY FOR MOOD  CONTROL. (Patient taking differently: Take 0.5-1 mg by mouth See admin instructions. Take 0.5 mg by mouth in the morning and 1 mg at bedtime) 180 tablet 0   rosuvastatin (  CRESTOR) 10 MG tablet Take 10 mg by mouth daily.     sertraline (ZOLOFT) 100 MG tablet Take 200 mg by mouth in the morning.     glucose blood test strip Use to check blood sugar twice daily (Patient taking differently: 1 each by Other route as needed (blood sugar). One touch verio--Use to check blood sugar twice daily) 100 each 5   Insulin Detemir (LEVEMIR FLEXTOUCH) 100 UNIT/ML Pen Inject 28 Units into the skin daily at 10 pm. (Patient not taking: Reported on 05/01/2023) 9 mL 0   Insulin Pen Needle (BD PEN NEEDLE NANO U/F) 32G X 4 MM MISC Test daily. 100 each 5   meloxicam (MOBIC) 15 MG tablet Take 1 tablet (15 mg total) by mouth daily. (Patient not taking: Reported on 04/29/2023) 30 tablet 0   Psychiatric Specialty Exam: Presentation  General Appearance:  Fairly Groomed  Eye Contact: Good  Speech: Clear and Coherent  Speech Volume: Normal  Handedness: Right   Mood and Affect  Mood: Anxious; Depressed  Affect: Congruent   Thought Process  Thought Processes: Coherent  Descriptions of Associations:Intact  Orientation:Full (Time, Place and Person)  Thought Content:WDL  History of Schizophrenia/Schizoaffective disorder:No  Duration of Psychotic Symptoms:No data recorded Hallucinations:Hallucinations: None  Ideas of Reference:None  Suicidal Thoughts:Suicidal Thoughts: No  Homicidal Thoughts:Homicidal Thoughts: No   Sensorium  Memory: Immediate Fair; Recent Fair  Judgment: Fair  Insight: Fair   Art therapist  Concentration: Good  Attention Span: Good  Recall: Good  Fund of Knowledge: Good  Language: Good   Psychomotor Activity  Psychomotor Activity: Psychomotor Activity:  Normal   Assets  Assets: Desire for Improvement; Leisure Time; Physical Health; Resilience    Sleep  Sleep: Sleep: Good   Physical Exam: Physical Exam Neurological:     Mental Status: She is alert and oriented to person, place, and time.  Psychiatric:        Attention and Perception: Attention normal.        Mood and Affect: Mood is anxious.        Speech: Speech normal.        Behavior: Behavior is cooperative.        Thought Content: Thought content normal.    Review of Systems  Constitutional:        Mobility limitations  Psychiatric/Behavioral:  Positive for depression. The patient is nervous/anxious.    Blood pressure (!) 142/73, pulse 85, temperature 97.9 F (36.6 C), resp. rate 20, SpO2 96 %. There is no height or weight on file to calculate BMI.  Medical Decision Making: Pt case reviewed and discussed with Dr. Lucianne Muss. Pt does not meet inpatient psychiatric treatment criteria at this time. Pt reported her main goal is for ALF placement due to physical limitations. Pt is psychiatrically cleared. ED team and TOC notified.   - resources provided in AVS for establishing therapy  Disposition: No evidence of imminent risk to self or others at present.   Patient does not meet criteria for psychiatric inpatient admission. Supportive therapy provided about ongoing stressors. Discussed crisis plan, support from social network, calling 911, coming to the Emergency Department, and calling Suicide Hotline.  Eligha Bridegroom, NP 05/03/2023 11:12 AM

## 2023-05-03 NOTE — Inpatient Diabetes Management (Signed)
Inpatient Diabetes Program Recommendations  AACE/ADA: New Consensus Statement on Inpatient Glycemic Control (2015)  Target Ranges:  Prepandial:   less than 140 mg/dL      Peak postprandial:   less than 180 mg/dL (1-2 hours)      Critically ill patients:  140 - 180 mg/dL   Lab Results  Component Value Date   GLUCAP 94 05/03/2023   HGBA1C 7.7 (H) 03/09/2021   Review of Glycemic Control  Latest Reference Range & Units 05/01/23 21:05 05/02/23 16:49 05/02/23 21:12 05/03/23 07:56 05/03/23 08:59  Glucose-Capillary 70 - 99 mg/dL 540 (H) 981 (H) 191 (H) 59 (L) 94   Diabetes history: DM 2 Outpatient Diabetes medications: Glipizide 10 mg Daily, Lantus 20 units qhs, Metformin 1000 mg bid  Current orders for Inpatient glycemic control:  Glipizide 10 mg Daily Metformin 1000 mg bid Levemir 28 units qhs   Inpatient Diabetes Program Recommendations:    Note: Glucose 59 this am.   -   d/c sulfonylurea, Glipizide while inpatient. Insulin is more easily titratable.  Thanks,  Christena Deem RN, MSN, BC-ADM Inpatient Diabetes Coordinator Team Pager [phone] (8a-5p)

## 2023-05-03 NOTE — ED Provider Notes (Signed)
Emergency Medicine Observation Re-evaluation Note  Jaclyn [name] is a 67 y.o. female, seen on rounds today.  Pt initially presented to the ED for complaints of Suicidal Currently, the patient is resting comfortably.  Physical Exam  BP (!) 142/73 (BP Location: Left Arm)   Pulse 85   Temp 97.9 F (36.6 C)   Resp 20   SpO2 96%  Physical Exam Vitals and nursing note reviewed.  Constitutional:      General: She is not in acute distress.    Appearance: She is well-developed.  HENT:     Head: Normocephalic and atraumatic.  Eyes:     Conjunctiva/sclera: Conjunctivae normal.  Cardiovascular:     Rate and Rhythm: Normal rate and regular rhythm.     Heart sounds: No murmur heard. Pulmonary:     Effort: Pulmonary effort is normal. No respiratory distress.  Musculoskeletal:        General: No swelling.     Cervical back: Neck supple.  Skin:    General: Skin is warm and dry.     Capillary Refill: Capillary refill takes less than 2 seconds.  Neurological:     Mental Status: She is alert.  Psychiatric:        Mood and Affect: Mood normal.      ED Course / MDM  EKG:EKG Interpretation  Date/Time:  Saturday May 01 2023 15:08:57 EDT Ventricular Rate:  106 PR Interval:  134 QRS Duration: 64 QT Interval:  346 QTC Calculation: 459 R Axis:   76 Text Interpretation: Sinus tachycardia Nonspecific ST abnormality Confirmed by Vivi Barrack [phone]) on 05/01/2023 4:54:14 PM  I have reviewed the labs performed to date as well as medications administered while in observation.  Recent changes in the last 24 hours include TTS evaluation and patient has been psychiatrically cleared.  Recommending TOC evaluation for discharge planning.  TOC consulted and recommendations pending  Plan  Current plan is for [facility] evaluation.    Glendora Score, MD 05/03/23 1124

## 2023-05-03 NOTE — ED Notes (Signed)
Daughter called and stated that she will be here around 1530 to pick up pt

## 2023-05-03 NOTE — ED Notes (Signed)
Pt took a shower w/assistance. Pt was given clean scrubs and pt's bedding was changed.

## 2023-05-03 NOTE — ED Notes (Signed)
Pt's glucose was checked prior to eating breakfast. I encouraged pt to eat breakfast and she ate all of her breakfast.

## 2023-05-03 NOTE — Discharge Instructions (Signed)
Private Pay Resources  [facility] Address: [address], [place],  [place] [address] Phone: [phone]  [facility] Address: [address], [place], [place] [address] Phone: [phone]  [facility] Address: [address], [place], [place] [address] Phone: [phone]  [facility] Address: [address], [place], [place] [address] Phone: [phone]  [facility] Address: [address], [place], [place] [address] Phone: [phone]  [facility] Phone: [phone]  [facility] Address:  [address], [place], [place] [address] Phone:  [phone]  [facility] Address:  [address] Phone:  [phone]  [email or web address]  [facility] Phone: [phone]   

## 2023-05-05 NOTE — Progress Notes (Deleted)
  Cardiology Office Note:   Date:  05/05/2023  ID:  Jaclyn [name], DOB 1956/08/04, MRN [id number]  History of Present Illness:   Jaclyn [name] is a 67 y.o. female who was referred by Clayborn Heron, MD for evaluation of difficult to control HTN.  I added Norvasc and PRN hydralazine.   Since I last saw her ***   *** she has done quite well.  We switched her to Tribenzor and this seems to have done the trick.  Her blood pressures are excellent. The patient denies any new symptoms such as chest discomfort, neck or arm discomfort. There has been no new shortness of breath, PND or orthopnea. There have been no reported palpitations, presyncope or syncope.    ROS: ***  Studies Reviewed:    EKG:  ***  ***  Risk Assessment/Calculations:   {Does this patient have ATRIAL FIBRILLATION?:[id number]} No BP recorded.  {Refresh Note OR Click here to enter BP  :1}***        Physical Exam:   VS:  There were no vitals taken for this visit.   Wt Readings from Last 3 Encounters:  04/29/23 116 lb 13.5 oz (53 kg)  04/26/23 117 lb 6.4 oz (53.3 kg)  04/13/23 119 lb (54 kg)     GEN: Well nourished, well developed in no acute distress NECK: No JVD; No carotid bruits CARDIAC: ***RRR, no murmurs, rubs, gallops RESPIRATORY:  Clear to auscultation without rales, wheezing or rhonchi  ABDOMEN: Soft, non-tender, non-distended EXTREMITIES:  No edema; No deformity   ASSESSMENT AND PLAN:   HTN:   Her blood pressure is ***  excellent.  She will continue the meds as listed.  No change in therapy.    DM: Her A1c was ***  excellent by her report although I do not have these records.  No change in therapy.       {Are you ordering a CV Procedure (e.g. stress test, cath, DCCV, TEE, etc)?   Press F2        :[id number]}   Signed, Rollene Rotunda, MD

## 2023-05-06 ENCOUNTER — Ambulatory Visit: Payer: Medicare Other | Admitting: Family

## 2023-05-06 ENCOUNTER — Other Ambulatory Visit: Payer: Self-pay

## 2023-05-06 ENCOUNTER — Ambulatory Visit: Payer: Medicare Other | Attending: Cardiology | Admitting: Cardiology

## 2023-05-06 ENCOUNTER — Other Ambulatory Visit (INDEPENDENT_AMBULATORY_CARE_PROVIDER_SITE_OTHER): Payer: Medicare Other

## 2023-05-06 DIAGNOSIS — I10 Essential (primary) hypertension: Secondary | ICD-10-CM

## 2023-05-06 DIAGNOSIS — E118 Type 2 diabetes mellitus with unspecified complications: Secondary | ICD-10-CM

## 2023-05-06 DIAGNOSIS — Z022 Encounter for examination for admission to residential institution: Secondary | ICD-10-CM

## 2023-05-08 LAB — QUANTIFERON-TB GOLD PLUS
Mitogen-NIL: >10 IU/mL
NIL: 0.02 IU/mL
QuantiFERON-TB Gold Plus: NEGATIVE
TB1-NIL: 0 IU/mL
TB2-NIL: 0.01 IU/mL

## 2023-05-09 NOTE — Progress Notes (Signed)
TB negative.  Please forward to pt

## 2023-05-10 ENCOUNTER — Telehealth: Payer: Self-pay

## 2023-05-10 NOTE — Telephone Encounter (Signed)
Transition Care Management Follow-up Telephone Call Date of discharge and from where: Jaclyn [name] 05/03/2023 How have you been since you were released from the hospital? Good she has been moved  to ALF Any questions or concerns? No  Items Reviewed: Did the pt receive and understand the discharge instructions provided? Yes  Medications obtained and verified? Yes  Other? No  Any new allergies since your discharge? No  Dietary orders reviewed? No Do you have support at home? Yes    Follow up appointments reviewed:  PCP Hospital f/u appt confirmed?     Scheduled to see  on  @ . Specialist Hospital f/u appt confirmed? No  Scheduled to see  on  @ . Are transportation arrangements needed? No  If their condition worsens, is the pt aware to call PCP or go to the Emergency Dept.? Yes Was the patient provided with contact information for the PCP's office or ED? Yes Was to pt encouraged to call back with questions or concerns? Yes

## 2023-05-11 ENCOUNTER — Telehealth: Payer: Self-pay | Admitting: Family

## 2023-05-11 NOTE — Telephone Encounter (Signed)
Copied from CRM [id number]. Topic: Medicare AWV >> May 11, 2023 11:42 AM Gwenith Spitz wrote: Reason for CRM: Called patient to schedule Medicare Annual Wellness Visit (AWV). Left message for patient to call back and schedule Medicare Annual Wellness Visit (AWV).  Last date of AWV: 04/13/2018  Please schedule an appointment at any time with Inetta Fermo, [facility]. Please schedule AWVS with Inetta Fermo, [facility].  If any questions, please contact me at [phone].  Thank you ,  Gabriel Cirri [facility] AWV TEAM Direct Dial [phone]

## 2023-06-01 DIAGNOSIS — E785 Hyperlipidemia, unspecified: Secondary | ICD-10-CM | POA: Diagnosis not present

## 2023-06-01 DIAGNOSIS — F32A Depression, unspecified: Secondary | ICD-10-CM | POA: Diagnosis not present

## 2023-06-01 DIAGNOSIS — E119 Type 2 diabetes mellitus without complications: Secondary | ICD-10-CM | POA: Diagnosis not present

## 2023-06-01 DIAGNOSIS — I10 Essential (primary) hypertension: Secondary | ICD-10-CM | POA: Diagnosis not present

## 2023-06-01 DIAGNOSIS — K219 Gastro-esophageal reflux disease without esophagitis: Secondary | ICD-10-CM | POA: Diagnosis not present

## 2023-06-11 DIAGNOSIS — E782 Mixed hyperlipidemia: Secondary | ICD-10-CM | POA: Diagnosis not present

## 2023-06-11 DIAGNOSIS — D518 Other vitamin B12 deficiency anemias: Secondary | ICD-10-CM | POA: Diagnosis not present

## 2023-06-11 DIAGNOSIS — E559 Vitamin D deficiency, unspecified: Secondary | ICD-10-CM | POA: Diagnosis not present

## 2023-06-11 DIAGNOSIS — E038 Other specified hypothyroidism: Secondary | ICD-10-CM | POA: Diagnosis not present

## 2023-06-11 DIAGNOSIS — E119 Type 2 diabetes mellitus without complications: Secondary | ICD-10-CM | POA: Diagnosis not present

## 2023-06-11 DIAGNOSIS — Z79899 Other long term (current) drug therapy: Secondary | ICD-10-CM | POA: Diagnosis not present

## 2023-06-17 DIAGNOSIS — E119 Type 2 diabetes mellitus without complications: Secondary | ICD-10-CM | POA: Diagnosis not present

## 2023-06-17 DIAGNOSIS — F32A Depression, unspecified: Secondary | ICD-10-CM | POA: Diagnosis not present

## 2023-06-17 DIAGNOSIS — I10 Essential (primary) hypertension: Secondary | ICD-10-CM | POA: Diagnosis not present

## 2023-06-17 DIAGNOSIS — K219 Gastro-esophageal reflux disease without esophagitis: Secondary | ICD-10-CM | POA: Diagnosis not present

## 2023-06-17 DIAGNOSIS — E785 Hyperlipidemia, unspecified: Secondary | ICD-10-CM | POA: Diagnosis not present

## 2023-06-21 DIAGNOSIS — Z556 Problems related to health literacy: Secondary | ICD-10-CM | POA: Diagnosis not present

## 2023-06-21 DIAGNOSIS — I10 Essential (primary) hypertension: Secondary | ICD-10-CM | POA: Diagnosis not present

## 2023-06-21 DIAGNOSIS — F32A Depression, unspecified: Secondary | ICD-10-CM | POA: Diagnosis not present

## 2023-06-21 DIAGNOSIS — I499 Cardiac arrhythmia, unspecified: Secondary | ICD-10-CM | POA: Diagnosis not present

## 2023-06-21 DIAGNOSIS — R6889 Other general symptoms and signs: Secondary | ICD-10-CM | POA: Diagnosis not present

## 2023-06-21 DIAGNOSIS — Z7984 Long term (current) use of oral hypoglycemic drugs: Secondary | ICD-10-CM | POA: Diagnosis not present

## 2023-06-21 DIAGNOSIS — E1165 Type 2 diabetes mellitus with hyperglycemia: Secondary | ICD-10-CM | POA: Diagnosis not present

## 2023-06-21 DIAGNOSIS — R531 Weakness: Secondary | ICD-10-CM | POA: Diagnosis not present

## 2023-06-21 DIAGNOSIS — R739 Hyperglycemia, unspecified: Secondary | ICD-10-CM | POA: Diagnosis not present

## 2023-06-21 DIAGNOSIS — Z743 Need for continuous supervision: Secondary | ICD-10-CM | POA: Diagnosis not present

## 2023-06-25 DIAGNOSIS — I10 Essential (primary) hypertension: Secondary | ICD-10-CM | POA: Diagnosis not present

## 2023-06-25 DIAGNOSIS — E119 Type 2 diabetes mellitus without complications: Secondary | ICD-10-CM | POA: Diagnosis not present

## 2023-06-25 DIAGNOSIS — F32A Depression, unspecified: Secondary | ICD-10-CM | POA: Diagnosis not present

## 2023-06-25 DIAGNOSIS — E785 Hyperlipidemia, unspecified: Secondary | ICD-10-CM | POA: Diagnosis not present

## 2023-06-25 DIAGNOSIS — K219 Gastro-esophageal reflux disease without esophagitis: Secondary | ICD-10-CM | POA: Diagnosis not present

## 2023-06-30 DIAGNOSIS — M6281 Muscle weakness (generalized): Secondary | ICD-10-CM | POA: Diagnosis not present

## 2023-06-30 DIAGNOSIS — R2681 Unsteadiness on feet: Secondary | ICD-10-CM | POA: Diagnosis not present

## 2023-07-01 DIAGNOSIS — R2681 Unsteadiness on feet: Secondary | ICD-10-CM | POA: Diagnosis not present

## 2023-07-01 DIAGNOSIS — M6281 Muscle weakness (generalized): Secondary | ICD-10-CM | POA: Diagnosis not present

## 2023-07-06 DIAGNOSIS — R55 Syncope and collapse: Secondary | ICD-10-CM | POA: Diagnosis not present

## 2023-07-06 DIAGNOSIS — D72829 Elevated white blood cell count, unspecified: Secondary | ICD-10-CM | POA: Diagnosis not present

## 2023-07-06 DIAGNOSIS — E785 Hyperlipidemia, unspecified: Secondary | ICD-10-CM | POA: Diagnosis not present

## 2023-07-06 DIAGNOSIS — N179 Acute kidney failure, unspecified: Secondary | ICD-10-CM | POA: Diagnosis not present

## 2023-07-06 DIAGNOSIS — K828 Other specified diseases of gallbladder: Secondary | ICD-10-CM | POA: Diagnosis not present

## 2023-07-06 DIAGNOSIS — R531 Weakness: Secondary | ICD-10-CM | POA: Diagnosis not present

## 2023-07-06 DIAGNOSIS — R404 Transient alteration of awareness: Secondary | ICD-10-CM | POA: Diagnosis not present

## 2023-07-06 DIAGNOSIS — Z888 Allergy status to other drugs, medicaments and biological substances status: Secondary | ICD-10-CM | POA: Diagnosis not present

## 2023-07-06 DIAGNOSIS — I700 Atherosclerosis of aorta: Secondary | ICD-10-CM | POA: Diagnosis not present

## 2023-07-06 DIAGNOSIS — M6281 Muscle weakness (generalized): Secondary | ICD-10-CM | POA: Diagnosis not present

## 2023-07-06 DIAGNOSIS — Z79899 Other long term (current) drug therapy: Secondary | ICD-10-CM | POA: Diagnosis not present

## 2023-07-06 DIAGNOSIS — R9082 White matter disease, unspecified: Secondary | ICD-10-CM | POA: Diagnosis not present

## 2023-07-06 DIAGNOSIS — K449 Diaphragmatic hernia without obstruction or gangrene: Secondary | ICD-10-CM | POA: Diagnosis not present

## 2023-07-06 DIAGNOSIS — R400 Somnolence: Secondary | ICD-10-CM | POA: Diagnosis not present

## 2023-07-06 DIAGNOSIS — R112 Nausea with vomiting, unspecified: Secondary | ICD-10-CM | POA: Diagnosis not present

## 2023-07-06 DIAGNOSIS — E0865 Diabetes mellitus due to underlying condition with hyperglycemia: Secondary | ICD-10-CM | POA: Diagnosis not present

## 2023-07-06 DIAGNOSIS — R1111 Vomiting without nausea: Secondary | ICD-10-CM | POA: Diagnosis not present

## 2023-07-06 DIAGNOSIS — R6889 Other general symptoms and signs: Secondary | ICD-10-CM | POA: Diagnosis not present

## 2023-07-06 DIAGNOSIS — Z743 Need for continuous supervision: Secondary | ICD-10-CM | POA: Diagnosis not present

## 2023-07-06 DIAGNOSIS — R61 Generalized hyperhidrosis: Secondary | ICD-10-CM | POA: Diagnosis not present

## 2023-07-06 DIAGNOSIS — R569 Unspecified convulsions: Secondary | ICD-10-CM | POA: Diagnosis not present

## 2023-07-06 DIAGNOSIS — K760 Fatty (change of) liver, not elsewhere classified: Secondary | ICD-10-CM | POA: Diagnosis not present

## 2023-07-06 DIAGNOSIS — Z886 Allergy status to analgesic agent status: Secondary | ICD-10-CM | POA: Diagnosis not present

## 2023-07-06 DIAGNOSIS — R1110 Vomiting, unspecified: Secondary | ICD-10-CM | POA: Diagnosis not present

## 2023-07-06 DIAGNOSIS — Z7984 Long term (current) use of oral hypoglycemic drugs: Secondary | ICD-10-CM | POA: Diagnosis not present

## 2023-07-06 DIAGNOSIS — E860 Dehydration: Secondary | ICD-10-CM | POA: Diagnosis not present

## 2023-07-06 DIAGNOSIS — R2681 Unsteadiness on feet: Secondary | ICD-10-CM | POA: Diagnosis not present

## 2023-07-07 DIAGNOSIS — R4020 Unspecified coma: Secondary | ICD-10-CM | POA: Diagnosis not present

## 2023-07-07 DIAGNOSIS — K828 Other specified diseases of gallbladder: Secondary | ICD-10-CM | POA: Diagnosis not present

## 2023-07-07 DIAGNOSIS — R1110 Vomiting, unspecified: Secondary | ICD-10-CM | POA: Diagnosis not present

## 2023-07-07 DIAGNOSIS — I080 Rheumatic disorders of both mitral and aortic valves: Secondary | ICD-10-CM | POA: Diagnosis not present

## 2023-07-07 DIAGNOSIS — N179 Acute kidney failure, unspecified: Secondary | ICD-10-CM | POA: Diagnosis not present

## 2023-07-07 DIAGNOSIS — Z794 Long term (current) use of insulin: Secondary | ICD-10-CM | POA: Diagnosis not present

## 2023-07-07 DIAGNOSIS — R55 Syncope and collapse: Secondary | ICD-10-CM | POA: Diagnosis not present

## 2023-07-07 DIAGNOSIS — I3139 Other pericardial effusion (noninflammatory): Secondary | ICD-10-CM | POA: Diagnosis not present

## 2023-07-07 DIAGNOSIS — E0865 Diabetes mellitus due to underlying condition with hyperglycemia: Secondary | ICD-10-CM | POA: Diagnosis not present

## 2023-07-08 ENCOUNTER — Telehealth: Payer: Self-pay

## 2023-07-08 DIAGNOSIS — E119 Type 2 diabetes mellitus without complications: Secondary | ICD-10-CM | POA: Diagnosis not present

## 2023-07-08 DIAGNOSIS — K219 Gastro-esophageal reflux disease without esophagitis: Secondary | ICD-10-CM | POA: Diagnosis not present

## 2023-07-08 DIAGNOSIS — E785 Hyperlipidemia, unspecified: Secondary | ICD-10-CM | POA: Diagnosis not present

## 2023-07-08 DIAGNOSIS — I10 Essential (primary) hypertension: Secondary | ICD-10-CM | POA: Diagnosis not present

## 2023-07-08 DIAGNOSIS — R400 Somnolence: Secondary | ICD-10-CM | POA: Diagnosis not present

## 2023-07-08 DIAGNOSIS — R2681 Unsteadiness on feet: Secondary | ICD-10-CM | POA: Diagnosis not present

## 2023-07-08 DIAGNOSIS — M6281 Muscle weakness (generalized): Secondary | ICD-10-CM | POA: Diagnosis not present

## 2023-07-08 DIAGNOSIS — F32A Depression, unspecified: Secondary | ICD-10-CM | POA: Diagnosis not present

## 2023-07-08 DIAGNOSIS — R112 Nausea with vomiting, unspecified: Secondary | ICD-10-CM | POA: Diagnosis not present

## 2023-07-09 ENCOUNTER — Telehealth: Payer: Self-pay

## 2023-07-09 NOTE — Transitions of Care (Post Inpatient/ED Visit) (Signed)
   07/09/2023  Name: Jaclyn [name] MRN: [id number] DOB: Nov 18, 1956  Today's TOC FU Call Status: Today's TOC FU Call Status:: Unsuccessful Call (2nd Attempt) Unsuccessful Call (2nd Attempt) Date: 07/09/23  Attempted to reach the patient regarding the most recent Inpatient/ED visit.  Follow Up Plan: Additional outreach attempts will be made to reach the patient to complete the Transitions of Care (Post Inpatient/ED visit) call.     Antionette Fairy, RN,BSN,CCM [facility] Care Management Care Management Community Coordinator Direct Phone: [phone] Toll Free: [phone] Fax: [phone]

## 2023-07-09 NOTE — Transitions of Care (Post Inpatient/ED Visit) (Signed)
   07/09/2023  Name: Jaclyn [name] MRN: [id number] DOB: 24-Feb-1956  Today's TOC FU Call Status: Today's TOC FU Call Status:: Unsuccessful Call (3rd Attempt) Unsuccessful Call (3rd Attempt) Date: 07/09/23  Attempted to reach the patient regarding the most recent Inpatient/ED visit.  Follow Up Plan: No further outreach attempts will be made at this time. We have been unable to contact the patient.     Antionette Fairy, RN,BSN,CCM [facility] Care Management Care Management Community Coordinator Direct Phone: [phone] Toll Free: [phone] Fax: [phone]

## 2023-07-10 DIAGNOSIS — E559 Vitamin D deficiency, unspecified: Secondary | ICD-10-CM | POA: Diagnosis not present

## 2023-07-10 DIAGNOSIS — E038 Other specified hypothyroidism: Secondary | ICD-10-CM | POA: Diagnosis not present

## 2023-07-10 DIAGNOSIS — D518 Other vitamin B12 deficiency anemias: Secondary | ICD-10-CM | POA: Diagnosis not present

## 2023-07-10 DIAGNOSIS — E119 Type 2 diabetes mellitus without complications: Secondary | ICD-10-CM | POA: Diagnosis not present

## 2023-07-10 DIAGNOSIS — E782 Mixed hyperlipidemia: Secondary | ICD-10-CM | POA: Diagnosis not present

## 2023-07-10 DIAGNOSIS — I70223 Atherosclerosis of native arteries of extremities with rest pain, bilateral legs: Secondary | ICD-10-CM | POA: Diagnosis not present

## 2023-07-12 DIAGNOSIS — M6281 Muscle weakness (generalized): Secondary | ICD-10-CM | POA: Diagnosis not present

## 2023-07-12 DIAGNOSIS — R2681 Unsteadiness on feet: Secondary | ICD-10-CM | POA: Diagnosis not present

## 2023-07-13 DIAGNOSIS — M6281 Muscle weakness (generalized): Secondary | ICD-10-CM | POA: Diagnosis not present

## 2023-07-13 DIAGNOSIS — R2681 Unsteadiness on feet: Secondary | ICD-10-CM | POA: Diagnosis not present

## 2023-07-14 DIAGNOSIS — M6281 Muscle weakness (generalized): Secondary | ICD-10-CM | POA: Diagnosis not present

## 2023-07-14 DIAGNOSIS — R2681 Unsteadiness on feet: Secondary | ICD-10-CM | POA: Diagnosis not present

## 2023-07-16 DIAGNOSIS — E782 Mixed hyperlipidemia: Secondary | ICD-10-CM | POA: Diagnosis not present

## 2023-07-16 DIAGNOSIS — D518 Other vitamin B12 deficiency anemias: Secondary | ICD-10-CM | POA: Diagnosis not present

## 2023-07-16 DIAGNOSIS — Z79899 Other long term (current) drug therapy: Secondary | ICD-10-CM | POA: Diagnosis not present

## 2023-07-16 DIAGNOSIS — E119 Type 2 diabetes mellitus without complications: Secondary | ICD-10-CM | POA: Diagnosis not present

## 2023-07-16 DIAGNOSIS — E038 Other specified hypothyroidism: Secondary | ICD-10-CM | POA: Diagnosis not present

## 2023-07-19 DIAGNOSIS — I77811 Abdominal aortic ectasia: Secondary | ICD-10-CM | POA: Diagnosis not present

## 2023-07-20 DIAGNOSIS — I6523 Occlusion and stenosis of bilateral carotid arteries: Secondary | ICD-10-CM | POA: Diagnosis not present

## 2023-07-20 DIAGNOSIS — R2681 Unsteadiness on feet: Secondary | ICD-10-CM | POA: Diagnosis not present

## 2023-07-20 DIAGNOSIS — M6281 Muscle weakness (generalized): Secondary | ICD-10-CM | POA: Diagnosis not present

## 2023-07-20 DIAGNOSIS — R0989 Other specified symptoms and signs involving the circulatory and respiratory systems: Secondary | ICD-10-CM | POA: Diagnosis not present

## 2023-07-21 DIAGNOSIS — R2681 Unsteadiness on feet: Secondary | ICD-10-CM | POA: Diagnosis not present

## 2023-07-21 DIAGNOSIS — M6281 Muscle weakness (generalized): Secondary | ICD-10-CM | POA: Diagnosis not present

## 2023-07-22 DIAGNOSIS — K219 Gastro-esophageal reflux disease without esophagitis: Secondary | ICD-10-CM | POA: Diagnosis not present

## 2023-07-22 DIAGNOSIS — E785 Hyperlipidemia, unspecified: Secondary | ICD-10-CM | POA: Diagnosis not present

## 2023-07-22 DIAGNOSIS — I959 Hypotension, unspecified: Secondary | ICD-10-CM | POA: Diagnosis not present

## 2023-07-22 DIAGNOSIS — R2681 Unsteadiness on feet: Secondary | ICD-10-CM | POA: Diagnosis not present

## 2023-07-22 DIAGNOSIS — M6281 Muscle weakness (generalized): Secondary | ICD-10-CM | POA: Diagnosis not present

## 2023-07-22 DIAGNOSIS — I10 Essential (primary) hypertension: Secondary | ICD-10-CM | POA: Diagnosis not present

## 2023-07-22 DIAGNOSIS — E1165 Type 2 diabetes mellitus with hyperglycemia: Secondary | ICD-10-CM | POA: Diagnosis not present

## 2023-07-22 DIAGNOSIS — E119 Type 2 diabetes mellitus without complications: Secondary | ICD-10-CM | POA: Diagnosis not present

## 2023-07-22 DIAGNOSIS — F32A Depression, unspecified: Secondary | ICD-10-CM | POA: Diagnosis not present

## 2023-07-22 DIAGNOSIS — R112 Nausea with vomiting, unspecified: Secondary | ICD-10-CM | POA: Diagnosis not present

## 2023-07-22 DIAGNOSIS — I70223 Atherosclerosis of native arteries of extremities with rest pain, bilateral legs: Secondary | ICD-10-CM | POA: Diagnosis not present

## 2023-07-23 DIAGNOSIS — E1165 Type 2 diabetes mellitus with hyperglycemia: Secondary | ICD-10-CM | POA: Diagnosis not present

## 2023-07-26 DIAGNOSIS — R2681 Unsteadiness on feet: Secondary | ICD-10-CM | POA: Diagnosis not present

## 2023-07-26 DIAGNOSIS — M6281 Muscle weakness (generalized): Secondary | ICD-10-CM | POA: Diagnosis not present

## 2023-07-29 DIAGNOSIS — M6281 Muscle weakness (generalized): Secondary | ICD-10-CM | POA: Diagnosis not present

## 2023-07-29 DIAGNOSIS — R2681 Unsteadiness on feet: Secondary | ICD-10-CM | POA: Diagnosis not present

## 2023-07-30 DIAGNOSIS — M6281 Muscle weakness (generalized): Secondary | ICD-10-CM | POA: Diagnosis not present

## 2023-07-30 DIAGNOSIS — R2681 Unsteadiness on feet: Secondary | ICD-10-CM | POA: Diagnosis not present

## 2023-08-05 DIAGNOSIS — M6281 Muscle weakness (generalized): Secondary | ICD-10-CM | POA: Diagnosis not present

## 2023-08-05 DIAGNOSIS — R2681 Unsteadiness on feet: Secondary | ICD-10-CM | POA: Diagnosis not present

## 2023-08-06 DIAGNOSIS — R2681 Unsteadiness on feet: Secondary | ICD-10-CM | POA: Diagnosis not present

## 2023-08-06 DIAGNOSIS — M6281 Muscle weakness (generalized): Secondary | ICD-10-CM | POA: Diagnosis not present

## 2023-08-09 DIAGNOSIS — M6281 Muscle weakness (generalized): Secondary | ICD-10-CM | POA: Diagnosis not present

## 2023-08-09 DIAGNOSIS — R2681 Unsteadiness on feet: Secondary | ICD-10-CM | POA: Diagnosis not present

## 2023-08-13 DIAGNOSIS — M6281 Muscle weakness (generalized): Secondary | ICD-10-CM | POA: Diagnosis not present

## 2023-08-13 DIAGNOSIS — R2681 Unsteadiness on feet: Secondary | ICD-10-CM | POA: Diagnosis not present

## 2023-08-16 DIAGNOSIS — M6281 Muscle weakness (generalized): Secondary | ICD-10-CM | POA: Diagnosis not present

## 2023-08-16 DIAGNOSIS — R2681 Unsteadiness on feet: Secondary | ICD-10-CM | POA: Diagnosis not present

## 2023-08-17 DIAGNOSIS — E038 Other specified hypothyroidism: Secondary | ICD-10-CM | POA: Diagnosis not present

## 2023-08-17 DIAGNOSIS — D518 Other vitamin B12 deficiency anemias: Secondary | ICD-10-CM | POA: Diagnosis not present

## 2023-08-17 DIAGNOSIS — E119 Type 2 diabetes mellitus without complications: Secondary | ICD-10-CM | POA: Diagnosis not present

## 2023-08-17 DIAGNOSIS — E782 Mixed hyperlipidemia: Secondary | ICD-10-CM | POA: Diagnosis not present

## 2023-08-17 DIAGNOSIS — E559 Vitamin D deficiency, unspecified: Secondary | ICD-10-CM | POA: Diagnosis not present

## 2023-08-17 DIAGNOSIS — I70223 Atherosclerosis of native arteries of extremities with rest pain, bilateral legs: Secondary | ICD-10-CM | POA: Diagnosis not present

## 2023-08-20 DIAGNOSIS — R2681 Unsteadiness on feet: Secondary | ICD-10-CM | POA: Diagnosis not present

## 2023-08-20 DIAGNOSIS — M6281 Muscle weakness (generalized): Secondary | ICD-10-CM | POA: Diagnosis not present

## 2023-08-23 DIAGNOSIS — I959 Hypotension, unspecified: Secondary | ICD-10-CM | POA: Diagnosis not present

## 2023-08-24 DIAGNOSIS — M6281 Muscle weakness (generalized): Secondary | ICD-10-CM | POA: Diagnosis not present

## 2023-08-24 DIAGNOSIS — E1165 Type 2 diabetes mellitus with hyperglycemia: Secondary | ICD-10-CM | POA: Diagnosis not present

## 2023-08-24 DIAGNOSIS — R2681 Unsteadiness on feet: Secondary | ICD-10-CM | POA: Diagnosis not present

## 2023-08-25 DIAGNOSIS — M6281 Muscle weakness (generalized): Secondary | ICD-10-CM | POA: Diagnosis not present

## 2023-08-25 DIAGNOSIS — R2681 Unsteadiness on feet: Secondary | ICD-10-CM | POA: Diagnosis not present

## 2023-08-26 DIAGNOSIS — M6281 Muscle weakness (generalized): Secondary | ICD-10-CM | POA: Diagnosis not present

## 2023-08-26 DIAGNOSIS — R2681 Unsteadiness on feet: Secondary | ICD-10-CM | POA: Diagnosis not present

## 2023-08-31 DIAGNOSIS — M6281 Muscle weakness (generalized): Secondary | ICD-10-CM | POA: Diagnosis not present

## 2023-08-31 DIAGNOSIS — R2681 Unsteadiness on feet: Secondary | ICD-10-CM | POA: Diagnosis not present

## 2023-09-01 DIAGNOSIS — R2681 Unsteadiness on feet: Secondary | ICD-10-CM | POA: Diagnosis not present

## 2023-09-01 DIAGNOSIS — M6281 Muscle weakness (generalized): Secondary | ICD-10-CM | POA: Diagnosis not present

## 2023-09-02 DIAGNOSIS — M6281 Muscle weakness (generalized): Secondary | ICD-10-CM | POA: Diagnosis not present

## 2023-09-02 DIAGNOSIS — R2681 Unsteadiness on feet: Secondary | ICD-10-CM | POA: Diagnosis not present

## 2023-09-03 DIAGNOSIS — G3184 Mild cognitive impairment, so stated: Secondary | ICD-10-CM | POA: Diagnosis not present

## 2023-09-04 DIAGNOSIS — E119 Type 2 diabetes mellitus without complications: Secondary | ICD-10-CM | POA: Diagnosis not present

## 2023-09-04 DIAGNOSIS — E559 Vitamin D deficiency, unspecified: Secondary | ICD-10-CM | POA: Diagnosis not present

## 2023-09-04 DIAGNOSIS — E782 Mixed hyperlipidemia: Secondary | ICD-10-CM | POA: Diagnosis not present

## 2023-09-04 DIAGNOSIS — E038 Other specified hypothyroidism: Secondary | ICD-10-CM | POA: Diagnosis not present

## 2023-09-04 DIAGNOSIS — I70223 Atherosclerosis of native arteries of extremities with rest pain, bilateral legs: Secondary | ICD-10-CM | POA: Diagnosis not present

## 2023-09-06 DIAGNOSIS — M6281 Muscle weakness (generalized): Secondary | ICD-10-CM | POA: Diagnosis not present

## 2023-09-06 DIAGNOSIS — R2681 Unsteadiness on feet: Secondary | ICD-10-CM | POA: Diagnosis not present

## 2023-09-07 DIAGNOSIS — R2681 Unsteadiness on feet: Secondary | ICD-10-CM | POA: Diagnosis not present

## 2023-09-07 DIAGNOSIS — M6281 Muscle weakness (generalized): Secondary | ICD-10-CM | POA: Diagnosis not present

## 2023-09-08 ENCOUNTER — Telehealth: Payer: Self-pay | Admitting: Family

## 2023-09-08 DIAGNOSIS — M6281 Muscle weakness (generalized): Secondary | ICD-10-CM | POA: Diagnosis not present

## 2023-09-08 DIAGNOSIS — R2681 Unsteadiness on feet: Secondary | ICD-10-CM | POA: Diagnosis not present

## 2023-09-08 NOTE — Telephone Encounter (Signed)
Ronaldo Miyamoto from [facility] called regarding PT precertification that he has faxed multiple times. I have received the forms via fax and was able to confirm it with Ronaldo Miyamoto. I am placing them in the folder for completion. Caller states forms are close to being overdue.

## 2023-09-08 NOTE — Telephone Encounter (Signed)
Caller provided the fax number [phone].

## 2023-09-09 DIAGNOSIS — M6281 Muscle weakness (generalized): Secondary | ICD-10-CM | POA: Diagnosis not present

## 2023-09-09 DIAGNOSIS — R2681 Unsteadiness on feet: Secondary | ICD-10-CM | POA: Diagnosis not present

## 2023-09-10 DIAGNOSIS — M6281 Muscle weakness (generalized): Secondary | ICD-10-CM | POA: Diagnosis not present

## 2023-09-10 DIAGNOSIS — R2681 Unsteadiness on feet: Secondary | ICD-10-CM | POA: Diagnosis not present

## 2023-09-13 DIAGNOSIS — M6281 Muscle weakness (generalized): Secondary | ICD-10-CM | POA: Diagnosis not present

## 2023-09-13 DIAGNOSIS — R2681 Unsteadiness on feet: Secondary | ICD-10-CM | POA: Diagnosis not present

## 2023-09-14 DIAGNOSIS — M6281 Muscle weakness (generalized): Secondary | ICD-10-CM | POA: Diagnosis not present

## 2023-09-14 DIAGNOSIS — R2681 Unsteadiness on feet: Secondary | ICD-10-CM | POA: Diagnosis not present

## 2023-09-15 DIAGNOSIS — M6281 Muscle weakness (generalized): Secondary | ICD-10-CM | POA: Diagnosis not present

## 2023-09-15 DIAGNOSIS — R2681 Unsteadiness on feet: Secondary | ICD-10-CM | POA: Diagnosis not present

## 2023-09-16 DIAGNOSIS — R2681 Unsteadiness on feet: Secondary | ICD-10-CM | POA: Diagnosis not present

## 2023-09-16 DIAGNOSIS — M6281 Muscle weakness (generalized): Secondary | ICD-10-CM | POA: Diagnosis not present

## 2023-09-17 DIAGNOSIS — R2681 Unsteadiness on feet: Secondary | ICD-10-CM | POA: Diagnosis not present

## 2023-09-17 DIAGNOSIS — M6281 Muscle weakness (generalized): Secondary | ICD-10-CM | POA: Diagnosis not present

## 2023-09-21 DIAGNOSIS — R2681 Unsteadiness on feet: Secondary | ICD-10-CM | POA: Diagnosis not present

## 2023-09-21 DIAGNOSIS — M6281 Muscle weakness (generalized): Secondary | ICD-10-CM | POA: Diagnosis not present

## 2023-09-23 DIAGNOSIS — E119 Type 2 diabetes mellitus without complications: Secondary | ICD-10-CM | POA: Diagnosis not present

## 2023-09-23 DIAGNOSIS — I959 Hypotension, unspecified: Secondary | ICD-10-CM | POA: Diagnosis not present

## 2023-09-23 DIAGNOSIS — I10 Essential (primary) hypertension: Secondary | ICD-10-CM | POA: Diagnosis not present

## 2023-09-23 DIAGNOSIS — K219 Gastro-esophageal reflux disease without esophagitis: Secondary | ICD-10-CM | POA: Diagnosis not present

## 2023-09-24 DIAGNOSIS — E1165 Type 2 diabetes mellitus with hyperglycemia: Secondary | ICD-10-CM | POA: Diagnosis not present

## 2023-10-01 ENCOUNTER — Other Ambulatory Visit: Payer: Self-pay | Admitting: Family

## 2023-10-01 DIAGNOSIS — Z1211 Encounter for screening for malignant neoplasm of colon: Secondary | ICD-10-CM

## 2023-10-01 DIAGNOSIS — Z1212 Encounter for screening for malignant neoplasm of rectum: Secondary | ICD-10-CM

## 2023-10-05 DIAGNOSIS — G3184 Mild cognitive impairment, so stated: Secondary | ICD-10-CM | POA: Diagnosis not present

## 2023-10-08 DIAGNOSIS — E559 Vitamin D deficiency, unspecified: Secondary | ICD-10-CM | POA: Diagnosis not present

## 2023-10-08 DIAGNOSIS — E038 Other specified hypothyroidism: Secondary | ICD-10-CM | POA: Diagnosis not present

## 2023-10-08 DIAGNOSIS — D519 Vitamin B12 deficiency anemia, unspecified: Secondary | ICD-10-CM | POA: Diagnosis not present

## 2023-10-08 DIAGNOSIS — E782 Mixed hyperlipidemia: Secondary | ICD-10-CM | POA: Diagnosis not present

## 2023-10-08 DIAGNOSIS — Z79899 Other long term (current) drug therapy: Secondary | ICD-10-CM | POA: Diagnosis not present

## 2023-10-08 DIAGNOSIS — E119 Type 2 diabetes mellitus without complications: Secondary | ICD-10-CM | POA: Diagnosis not present

## 2023-10-14 DIAGNOSIS — E119 Type 2 diabetes mellitus without complications: Secondary | ICD-10-CM | POA: Diagnosis not present

## 2023-10-14 DIAGNOSIS — I70223 Atherosclerosis of native arteries of extremities with rest pain, bilateral legs: Secondary | ICD-10-CM | POA: Diagnosis not present

## 2023-10-14 DIAGNOSIS — E038 Other specified hypothyroidism: Secondary | ICD-10-CM | POA: Diagnosis not present

## 2023-10-14 DIAGNOSIS — E559 Vitamin D deficiency, unspecified: Secondary | ICD-10-CM | POA: Diagnosis not present

## 2023-10-14 DIAGNOSIS — D518 Other vitamin B12 deficiency anemias: Secondary | ICD-10-CM | POA: Diagnosis not present

## 2023-10-14 DIAGNOSIS — D519 Vitamin B12 deficiency anemia, unspecified: Secondary | ICD-10-CM | POA: Diagnosis not present

## 2023-10-14 DIAGNOSIS — E782 Mixed hyperlipidemia: Secondary | ICD-10-CM | POA: Diagnosis not present

## 2023-10-24 DIAGNOSIS — E1165 Type 2 diabetes mellitus with hyperglycemia: Secondary | ICD-10-CM | POA: Diagnosis not present

## 2023-10-29 DIAGNOSIS — I10 Essential (primary) hypertension: Secondary | ICD-10-CM | POA: Diagnosis not present

## 2023-10-29 DIAGNOSIS — E119 Type 2 diabetes mellitus without complications: Secondary | ICD-10-CM | POA: Diagnosis not present

## 2023-10-29 DIAGNOSIS — E785 Hyperlipidemia, unspecified: Secondary | ICD-10-CM | POA: Diagnosis not present

## 2023-11-13 DIAGNOSIS — D518 Other vitamin B12 deficiency anemias: Secondary | ICD-10-CM | POA: Diagnosis not present

## 2023-11-13 DIAGNOSIS — E119 Type 2 diabetes mellitus without complications: Secondary | ICD-10-CM | POA: Diagnosis not present

## 2023-11-13 DIAGNOSIS — E038 Other specified hypothyroidism: Secondary | ICD-10-CM | POA: Diagnosis not present

## 2023-11-13 DIAGNOSIS — E782 Mixed hyperlipidemia: Secondary | ICD-10-CM | POA: Diagnosis not present

## 2023-11-13 DIAGNOSIS — E559 Vitamin D deficiency, unspecified: Secondary | ICD-10-CM | POA: Diagnosis not present

## 2023-11-13 DIAGNOSIS — I70223 Atherosclerosis of native arteries of extremities with rest pain, bilateral legs: Secondary | ICD-10-CM | POA: Diagnosis not present

## 2023-11-16 DIAGNOSIS — G3184 Mild cognitive impairment, so stated: Secondary | ICD-10-CM | POA: Diagnosis not present

## 2023-11-18 DIAGNOSIS — Z79899 Other long term (current) drug therapy: Secondary | ICD-10-CM | POA: Diagnosis not present

## 2023-11-18 DIAGNOSIS — G3184 Mild cognitive impairment, so stated: Secondary | ICD-10-CM | POA: Diagnosis not present

## 2023-11-24 DIAGNOSIS — E1165 Type 2 diabetes mellitus with hyperglycemia: Secondary | ICD-10-CM | POA: Diagnosis not present

## 2023-11-26 DIAGNOSIS — K219 Gastro-esophageal reflux disease without esophagitis: Secondary | ICD-10-CM | POA: Diagnosis not present

## 2023-11-26 DIAGNOSIS — E119 Type 2 diabetes mellitus without complications: Secondary | ICD-10-CM | POA: Diagnosis not present

## 2023-11-26 DIAGNOSIS — I10 Essential (primary) hypertension: Secondary | ICD-10-CM | POA: Diagnosis not present

## 2023-12-01 DIAGNOSIS — N390 Urinary tract infection, site not specified: Secondary | ICD-10-CM | POA: Diagnosis not present

## 2023-12-20 DIAGNOSIS — G3184 Mild cognitive impairment, so stated: Secondary | ICD-10-CM | POA: Diagnosis not present

## 2023-12-24 DIAGNOSIS — E119 Type 2 diabetes mellitus without complications: Secondary | ICD-10-CM | POA: Diagnosis not present

## 2023-12-24 DIAGNOSIS — E785 Hyperlipidemia, unspecified: Secondary | ICD-10-CM | POA: Diagnosis not present

## 2023-12-24 DIAGNOSIS — K219 Gastro-esophageal reflux disease without esophagitis: Secondary | ICD-10-CM | POA: Diagnosis not present

## 2023-12-24 DIAGNOSIS — I10 Essential (primary) hypertension: Secondary | ICD-10-CM | POA: Diagnosis not present

## 2023-12-25 DIAGNOSIS — D519 Vitamin B12 deficiency anemia, unspecified: Secondary | ICD-10-CM | POA: Diagnosis not present

## 2023-12-25 DIAGNOSIS — E559 Vitamin D deficiency, unspecified: Secondary | ICD-10-CM | POA: Diagnosis not present

## 2023-12-25 DIAGNOSIS — I70223 Atherosclerosis of native arteries of extremities with rest pain, bilateral legs: Secondary | ICD-10-CM | POA: Diagnosis not present

## 2023-12-25 DIAGNOSIS — E119 Type 2 diabetes mellitus without complications: Secondary | ICD-10-CM | POA: Diagnosis not present

## 2023-12-25 DIAGNOSIS — E782 Mixed hyperlipidemia: Secondary | ICD-10-CM | POA: Diagnosis not present

## 2023-12-25 DIAGNOSIS — E038 Other specified hypothyroidism: Secondary | ICD-10-CM | POA: Diagnosis not present

## 2023-12-26 DIAGNOSIS — R7309 Other abnormal glucose: Secondary | ICD-10-CM | POA: Diagnosis not present

## 2024-01-05 DIAGNOSIS — I70223 Atherosclerosis of native arteries of extremities with rest pain, bilateral legs: Secondary | ICD-10-CM | POA: Diagnosis not present

## 2024-01-05 DIAGNOSIS — D519 Vitamin B12 deficiency anemia, unspecified: Secondary | ICD-10-CM | POA: Diagnosis not present

## 2024-01-05 DIAGNOSIS — E119 Type 2 diabetes mellitus without complications: Secondary | ICD-10-CM | POA: Diagnosis not present

## 2024-01-05 DIAGNOSIS — E038 Other specified hypothyroidism: Secondary | ICD-10-CM | POA: Diagnosis not present

## 2024-01-05 DIAGNOSIS — E559 Vitamin D deficiency, unspecified: Secondary | ICD-10-CM | POA: Diagnosis not present

## 2024-01-05 DIAGNOSIS — E782 Mixed hyperlipidemia: Secondary | ICD-10-CM | POA: Diagnosis not present

## 2024-01-13 DIAGNOSIS — E782 Mixed hyperlipidemia: Secondary | ICD-10-CM | POA: Diagnosis not present

## 2024-01-13 DIAGNOSIS — D519 Vitamin B12 deficiency anemia, unspecified: Secondary | ICD-10-CM | POA: Diagnosis not present

## 2024-01-13 DIAGNOSIS — Z79899 Other long term (current) drug therapy: Secondary | ICD-10-CM | POA: Diagnosis not present

## 2024-01-13 DIAGNOSIS — E119 Type 2 diabetes mellitus without complications: Secondary | ICD-10-CM | POA: Diagnosis not present

## 2024-01-13 DIAGNOSIS — E038 Other specified hypothyroidism: Secondary | ICD-10-CM | POA: Diagnosis not present

## 2024-01-16 DIAGNOSIS — Z885 Allergy status to narcotic agent status: Secondary | ICD-10-CM | POA: Diagnosis not present

## 2024-01-16 DIAGNOSIS — R739 Hyperglycemia, unspecified: Secondary | ICD-10-CM | POA: Diagnosis not present

## 2024-01-16 DIAGNOSIS — Z5329 Procedure and treatment not carried out because of patient's decision for other reasons: Secondary | ICD-10-CM | POA: Diagnosis not present

## 2024-01-16 DIAGNOSIS — G3184 Mild cognitive impairment, so stated: Secondary | ICD-10-CM | POA: Diagnosis not present

## 2024-01-16 DIAGNOSIS — U071 COVID-19: Secondary | ICD-10-CM | POA: Diagnosis not present

## 2024-01-16 DIAGNOSIS — R55 Syncope and collapse: Secondary | ICD-10-CM | POA: Diagnosis not present

## 2024-01-16 DIAGNOSIS — Z7985 Long-term (current) use of injectable non-insulin antidiabetic drugs: Secondary | ICD-10-CM | POA: Diagnosis not present

## 2024-01-16 DIAGNOSIS — R296 Repeated falls: Secondary | ICD-10-CM | POA: Diagnosis not present

## 2024-01-16 DIAGNOSIS — E1165 Type 2 diabetes mellitus with hyperglycemia: Secondary | ICD-10-CM | POA: Diagnosis not present

## 2024-01-16 DIAGNOSIS — I952 Hypotension due to drugs: Secondary | ICD-10-CM | POA: Diagnosis not present

## 2024-01-16 DIAGNOSIS — S0003XA Contusion of scalp, initial encounter: Secondary | ICD-10-CM | POA: Diagnosis not present

## 2024-01-16 DIAGNOSIS — I959 Hypotension, unspecified: Secondary | ICD-10-CM | POA: Diagnosis not present

## 2024-01-16 DIAGNOSIS — E119 Type 2 diabetes mellitus without complications: Secondary | ICD-10-CM | POA: Diagnosis not present

## 2024-01-16 DIAGNOSIS — S0990XA Unspecified injury of head, initial encounter: Secondary | ICD-10-CM | POA: Diagnosis not present

## 2024-01-16 DIAGNOSIS — Z794 Long term (current) use of insulin: Secondary | ICD-10-CM | POA: Diagnosis not present

## 2024-01-16 DIAGNOSIS — J9811 Atelectasis: Secondary | ICD-10-CM | POA: Diagnosis not present

## 2024-01-16 DIAGNOSIS — Z888 Allergy status to other drugs, medicaments and biological substances status: Secondary | ICD-10-CM | POA: Diagnosis not present

## 2024-01-16 DIAGNOSIS — R112 Nausea with vomiting, unspecified: Secondary | ICD-10-CM | POA: Diagnosis not present

## 2024-01-16 DIAGNOSIS — I700 Atherosclerosis of aorta: Secondary | ICD-10-CM | POA: Diagnosis not present

## 2024-01-16 DIAGNOSIS — Z79899 Other long term (current) drug therapy: Secondary | ICD-10-CM | POA: Diagnosis not present

## 2024-01-16 DIAGNOSIS — W19XXXA Unspecified fall, initial encounter: Secondary | ICD-10-CM | POA: Diagnosis not present

## 2024-01-16 DIAGNOSIS — I10 Essential (primary) hypertension: Secondary | ICD-10-CM | POA: Diagnosis not present

## 2024-01-16 DIAGNOSIS — G9340 Encephalopathy, unspecified: Secondary | ICD-10-CM | POA: Diagnosis not present

## 2024-01-16 DIAGNOSIS — G9349 Other encephalopathy: Secondary | ICD-10-CM | POA: Diagnosis not present

## 2024-01-16 DIAGNOSIS — I2510 Atherosclerotic heart disease of native coronary artery without angina pectoris: Secondary | ICD-10-CM | POA: Diagnosis not present

## 2024-01-16 DIAGNOSIS — Z789 Other specified health status: Secondary | ICD-10-CM | POA: Diagnosis not present

## 2024-01-16 DIAGNOSIS — D649 Anemia, unspecified: Secondary | ICD-10-CM | POA: Diagnosis not present

## 2024-01-16 DIAGNOSIS — T447X5A Adverse effect of beta-adrenoreceptor antagonists, initial encounter: Secondary | ICD-10-CM | POA: Diagnosis not present

## 2024-01-16 DIAGNOSIS — E785 Hyperlipidemia, unspecified: Secondary | ICD-10-CM | POA: Diagnosis not present

## 2024-01-16 DIAGNOSIS — Z7984 Long term (current) use of oral hypoglycemic drugs: Secondary | ICD-10-CM | POA: Diagnosis not present

## 2024-01-16 DIAGNOSIS — S0101XA Laceration without foreign body of scalp, initial encounter: Secondary | ICD-10-CM | POA: Diagnosis not present

## 2024-01-16 DIAGNOSIS — Z743 Need for continuous supervision: Secondary | ICD-10-CM | POA: Diagnosis not present

## 2024-01-16 DIAGNOSIS — R1310 Dysphagia, unspecified: Secondary | ICD-10-CM | POA: Diagnosis not present

## 2024-01-16 DIAGNOSIS — R531 Weakness: Secondary | ICD-10-CM | POA: Diagnosis not present

## 2024-01-16 DIAGNOSIS — I3139 Other pericardial effusion (noninflammatory): Secondary | ICD-10-CM | POA: Diagnosis not present

## 2024-01-16 DIAGNOSIS — N390 Urinary tract infection, site not specified: Secondary | ICD-10-CM | POA: Diagnosis not present

## 2024-01-17 DIAGNOSIS — U071 COVID-19: Secondary | ICD-10-CM | POA: Diagnosis not present

## 2024-01-17 DIAGNOSIS — R296 Repeated falls: Secondary | ICD-10-CM | POA: Diagnosis not present

## 2024-01-17 DIAGNOSIS — N390 Urinary tract infection, site not specified: Secondary | ICD-10-CM | POA: Diagnosis not present

## 2024-01-18 DIAGNOSIS — Z789 Other specified health status: Secondary | ICD-10-CM | POA: Diagnosis not present

## 2024-01-18 DIAGNOSIS — E785 Hyperlipidemia, unspecified: Secondary | ICD-10-CM | POA: Diagnosis not present

## 2024-01-18 DIAGNOSIS — R531 Weakness: Secondary | ICD-10-CM | POA: Diagnosis not present

## 2024-01-18 DIAGNOSIS — G3184 Mild cognitive impairment, so stated: Secondary | ICD-10-CM | POA: Diagnosis not present

## 2024-01-18 DIAGNOSIS — E119 Type 2 diabetes mellitus without complications: Secondary | ICD-10-CM | POA: Diagnosis not present

## 2024-01-18 DIAGNOSIS — I959 Hypotension, unspecified: Secondary | ICD-10-CM | POA: Diagnosis not present

## 2024-01-18 DIAGNOSIS — U071 COVID-19: Secondary | ICD-10-CM | POA: Diagnosis not present

## 2024-01-19 DIAGNOSIS — I959 Hypotension, unspecified: Secondary | ICD-10-CM | POA: Diagnosis not present

## 2024-01-20 DIAGNOSIS — I959 Hypotension, unspecified: Secondary | ICD-10-CM | POA: Diagnosis not present

## 2024-01-21 DIAGNOSIS — G9340 Encephalopathy, unspecified: Secondary | ICD-10-CM | POA: Diagnosis not present

## 2024-01-21 DIAGNOSIS — I959 Hypotension, unspecified: Secondary | ICD-10-CM | POA: Diagnosis not present

## 2024-01-22 DIAGNOSIS — I959 Hypotension, unspecified: Secondary | ICD-10-CM | POA: Diagnosis not present

## 2024-01-23 DIAGNOSIS — I959 Hypotension, unspecified: Secondary | ICD-10-CM | POA: Diagnosis not present

## 2024-01-24 DIAGNOSIS — I959 Hypotension, unspecified: Secondary | ICD-10-CM | POA: Diagnosis not present

## 2024-01-25 DIAGNOSIS — I959 Hypotension, unspecified: Secondary | ICD-10-CM | POA: Diagnosis not present

## 2024-01-26 DIAGNOSIS — I10 Essential (primary) hypertension: Secondary | ICD-10-CM | POA: Diagnosis not present

## 2024-01-26 DIAGNOSIS — R7309 Other abnormal glucose: Secondary | ICD-10-CM | POA: Diagnosis not present

## 2024-01-26 DIAGNOSIS — U071 COVID-19: Secondary | ICD-10-CM | POA: Diagnosis not present

## 2024-01-27 DIAGNOSIS — I10 Essential (primary) hypertension: Secondary | ICD-10-CM | POA: Diagnosis not present

## 2024-01-27 DIAGNOSIS — U071 COVID-19: Secondary | ICD-10-CM | POA: Diagnosis not present

## 2024-01-28 DIAGNOSIS — R051 Acute cough: Secondary | ICD-10-CM | POA: Diagnosis not present

## 2024-01-28 DIAGNOSIS — R531 Weakness: Secondary | ICD-10-CM | POA: Diagnosis not present

## 2024-01-31 DIAGNOSIS — E119 Type 2 diabetes mellitus without complications: Secondary | ICD-10-CM | POA: Diagnosis not present

## 2024-01-31 DIAGNOSIS — Z7984 Long term (current) use of oral hypoglycemic drugs: Secondary | ICD-10-CM | POA: Diagnosis not present

## 2024-01-31 DIAGNOSIS — I493 Ventricular premature depolarization: Secondary | ICD-10-CM | POA: Diagnosis not present

## 2024-01-31 DIAGNOSIS — R9431 Abnormal electrocardiogram [ECG] [EKG]: Secondary | ICD-10-CM | POA: Diagnosis not present

## 2024-01-31 DIAGNOSIS — G319 Degenerative disease of nervous system, unspecified: Secondary | ICD-10-CM | POA: Diagnosis not present

## 2024-01-31 DIAGNOSIS — Z9151 Personal history of suicidal behavior: Secondary | ICD-10-CM | POA: Diagnosis not present

## 2024-01-31 DIAGNOSIS — Z7985 Long-term (current) use of injectable non-insulin antidiabetic drugs: Secondary | ICD-10-CM | POA: Diagnosis not present

## 2024-01-31 DIAGNOSIS — Z79899 Other long term (current) drug therapy: Secondary | ICD-10-CM | POA: Diagnosis not present

## 2024-01-31 DIAGNOSIS — I6782 Cerebral ischemia: Secondary | ICD-10-CM | POA: Diagnosis not present

## 2024-01-31 DIAGNOSIS — Z556 Problems related to health literacy: Secondary | ICD-10-CM | POA: Diagnosis not present

## 2024-01-31 DIAGNOSIS — I6789 Other cerebrovascular disease: Secondary | ICD-10-CM | POA: Diagnosis not present

## 2024-01-31 DIAGNOSIS — Z8616 Personal history of COVID-19: Secondary | ICD-10-CM | POA: Diagnosis not present

## 2024-02-02 DIAGNOSIS — I10 Essential (primary) hypertension: Secondary | ICD-10-CM | POA: Diagnosis not present

## 2024-02-02 DIAGNOSIS — U071 COVID-19: Secondary | ICD-10-CM | POA: Diagnosis not present

## 2024-02-03 DIAGNOSIS — U071 COVID-19: Secondary | ICD-10-CM | POA: Diagnosis not present

## 2024-02-03 DIAGNOSIS — I10 Essential (primary) hypertension: Secondary | ICD-10-CM | POA: Diagnosis not present

## 2024-02-08 DIAGNOSIS — U071 COVID-19: Secondary | ICD-10-CM | POA: Diagnosis not present

## 2024-02-08 DIAGNOSIS — I10 Essential (primary) hypertension: Secondary | ICD-10-CM | POA: Diagnosis not present

## 2024-02-14 DIAGNOSIS — E785 Hyperlipidemia, unspecified: Secondary | ICD-10-CM | POA: Diagnosis not present

## 2024-02-14 DIAGNOSIS — E782 Mixed hyperlipidemia: Secondary | ICD-10-CM | POA: Diagnosis not present

## 2024-02-14 DIAGNOSIS — Z760 Encounter for issue of repeat prescription: Secondary | ICD-10-CM | POA: Diagnosis not present

## 2024-02-14 DIAGNOSIS — E038 Other specified hypothyroidism: Secondary | ICD-10-CM | POA: Diagnosis not present

## 2024-02-14 DIAGNOSIS — E119 Type 2 diabetes mellitus without complications: Secondary | ICD-10-CM | POA: Diagnosis not present

## 2024-02-14 DIAGNOSIS — D519 Vitamin B12 deficiency anemia, unspecified: Secondary | ICD-10-CM | POA: Diagnosis not present

## 2024-02-14 DIAGNOSIS — E559 Vitamin D deficiency, unspecified: Secondary | ICD-10-CM | POA: Diagnosis not present

## 2024-02-14 DIAGNOSIS — K219 Gastro-esophageal reflux disease without esophagitis: Secondary | ICD-10-CM | POA: Diagnosis not present

## 2024-02-14 DIAGNOSIS — I70223 Atherosclerosis of native arteries of extremities with rest pain, bilateral legs: Secondary | ICD-10-CM | POA: Diagnosis not present

## 2024-02-18 DIAGNOSIS — I10 Essential (primary) hypertension: Secondary | ICD-10-CM | POA: Diagnosis not present

## 2024-02-18 DIAGNOSIS — U071 COVID-19: Secondary | ICD-10-CM | POA: Diagnosis not present

## 2024-02-22 DIAGNOSIS — G3184 Mild cognitive impairment, so stated: Secondary | ICD-10-CM | POA: Diagnosis not present

## 2024-02-23 DIAGNOSIS — U071 COVID-19: Secondary | ICD-10-CM | POA: Diagnosis not present

## 2024-02-23 DIAGNOSIS — I10 Essential (primary) hypertension: Secondary | ICD-10-CM | POA: Diagnosis not present

## 2024-02-25 DIAGNOSIS — R7309 Other abnormal glucose: Secondary | ICD-10-CM | POA: Diagnosis not present

## 2024-03-01 DIAGNOSIS — E860 Dehydration: Secondary | ICD-10-CM | POA: Diagnosis not present

## 2024-03-01 DIAGNOSIS — R509 Fever, unspecified: Secondary | ICD-10-CM | POA: Diagnosis not present

## 2024-03-01 DIAGNOSIS — K760 Fatty (change of) liver, not elsewhere classified: Secondary | ICD-10-CM | POA: Diagnosis not present

## 2024-03-01 DIAGNOSIS — J90 Pleural effusion, not elsewhere classified: Secondary | ICD-10-CM | POA: Diagnosis not present

## 2024-03-01 DIAGNOSIS — K449 Diaphragmatic hernia without obstruction or gangrene: Secondary | ICD-10-CM | POA: Diagnosis not present

## 2024-03-01 DIAGNOSIS — Z885 Allergy status to narcotic agent status: Secondary | ICD-10-CM | POA: Diagnosis not present

## 2024-03-01 DIAGNOSIS — R110 Nausea: Secondary | ICD-10-CM | POA: Diagnosis not present

## 2024-03-01 DIAGNOSIS — N3289 Other specified disorders of bladder: Secondary | ICD-10-CM | POA: Diagnosis not present

## 2024-03-01 DIAGNOSIS — I2510 Atherosclerotic heart disease of native coronary artery without angina pectoris: Secondary | ICD-10-CM | POA: Diagnosis not present

## 2024-03-01 DIAGNOSIS — W19XXXA Unspecified fall, initial encounter: Secondary | ICD-10-CM | POA: Diagnosis not present

## 2024-03-01 DIAGNOSIS — Z7984 Long term (current) use of oral hypoglycemic drugs: Secondary | ICD-10-CM | POA: Diagnosis not present

## 2024-03-01 DIAGNOSIS — Z888 Allergy status to other drugs, medicaments and biological substances status: Secondary | ICD-10-CM | POA: Diagnosis not present

## 2024-03-01 DIAGNOSIS — K828 Other specified diseases of gallbladder: Secondary | ICD-10-CM | POA: Diagnosis not present

## 2024-03-01 DIAGNOSIS — R059 Cough, unspecified: Secondary | ICD-10-CM | POA: Diagnosis not present

## 2024-03-01 DIAGNOSIS — Z7985 Long-term (current) use of injectable non-insulin antidiabetic drugs: Secondary | ICD-10-CM | POA: Diagnosis not present

## 2024-03-01 DIAGNOSIS — G319 Degenerative disease of nervous system, unspecified: Secondary | ICD-10-CM | POA: Diagnosis not present

## 2024-03-01 DIAGNOSIS — E119 Type 2 diabetes mellitus without complications: Secondary | ICD-10-CM | POA: Diagnosis not present

## 2024-03-01 DIAGNOSIS — J9811 Atelectasis: Secondary | ICD-10-CM | POA: Diagnosis not present

## 2024-03-01 DIAGNOSIS — I3139 Other pericardial effusion (noninflammatory): Secondary | ICD-10-CM | POA: Diagnosis not present

## 2024-03-01 DIAGNOSIS — R6889 Other general symptoms and signs: Secondary | ICD-10-CM | POA: Diagnosis not present

## 2024-03-01 DIAGNOSIS — Z79899 Other long term (current) drug therapy: Secondary | ICD-10-CM | POA: Diagnosis not present

## 2024-03-01 DIAGNOSIS — R9082 White matter disease, unspecified: Secondary | ICD-10-CM | POA: Diagnosis not present

## 2024-03-02 DIAGNOSIS — R509 Fever, unspecified: Secondary | ICD-10-CM | POA: Diagnosis not present

## 2024-03-03 DIAGNOSIS — I70223 Atherosclerosis of native arteries of extremities with rest pain, bilateral legs: Secondary | ICD-10-CM | POA: Diagnosis not present

## 2024-03-03 DIAGNOSIS — D519 Vitamin B12 deficiency anemia, unspecified: Secondary | ICD-10-CM | POA: Diagnosis not present

## 2024-03-03 DIAGNOSIS — E119 Type 2 diabetes mellitus without complications: Secondary | ICD-10-CM | POA: Diagnosis not present

## 2024-03-03 DIAGNOSIS — E860 Dehydration: Secondary | ICD-10-CM | POA: Diagnosis not present

## 2024-03-03 DIAGNOSIS — E038 Other specified hypothyroidism: Secondary | ICD-10-CM | POA: Diagnosis not present

## 2024-03-03 DIAGNOSIS — E559 Vitamin D deficiency, unspecified: Secondary | ICD-10-CM | POA: Diagnosis not present

## 2024-03-03 DIAGNOSIS — E782 Mixed hyperlipidemia: Secondary | ICD-10-CM | POA: Diagnosis not present

## 2024-03-06 DIAGNOSIS — U071 COVID-19: Secondary | ICD-10-CM | POA: Diagnosis not present

## 2024-03-06 DIAGNOSIS — I10 Essential (primary) hypertension: Secondary | ICD-10-CM | POA: Diagnosis not present

## 2024-03-13 DIAGNOSIS — K219 Gastro-esophageal reflux disease without esophagitis: Secondary | ICD-10-CM | POA: Diagnosis not present

## 2024-03-13 DIAGNOSIS — E119 Type 2 diabetes mellitus without complications: Secondary | ICD-10-CM | POA: Diagnosis not present

## 2024-03-13 DIAGNOSIS — E785 Hyperlipidemia, unspecified: Secondary | ICD-10-CM | POA: Diagnosis not present

## 2024-03-14 DIAGNOSIS — I10 Essential (primary) hypertension: Secondary | ICD-10-CM | POA: Diagnosis not present

## 2024-03-14 DIAGNOSIS — U071 COVID-19: Secondary | ICD-10-CM | POA: Diagnosis not present

## 2024-03-21 DIAGNOSIS — G3184 Mild cognitive impairment, so stated: Secondary | ICD-10-CM | POA: Diagnosis not present

## 2024-04-07 DIAGNOSIS — K219 Gastro-esophageal reflux disease without esophagitis: Secondary | ICD-10-CM | POA: Diagnosis not present

## 2024-04-07 DIAGNOSIS — E119 Type 2 diabetes mellitus without complications: Secondary | ICD-10-CM | POA: Diagnosis not present

## 2024-04-07 DIAGNOSIS — E785 Hyperlipidemia, unspecified: Secondary | ICD-10-CM | POA: Diagnosis not present

## 2024-04-11 DIAGNOSIS — R011 Cardiac murmur, unspecified: Secondary | ICD-10-CM | POA: Diagnosis not present

## 2024-04-18 DIAGNOSIS — G3184 Mild cognitive impairment, so stated: Secondary | ICD-10-CM | POA: Diagnosis not present

## 2024-04-20 DIAGNOSIS — Z79899 Other long term (current) drug therapy: Secondary | ICD-10-CM | POA: Diagnosis not present

## 2024-04-20 DIAGNOSIS — D519 Vitamin B12 deficiency anemia, unspecified: Secondary | ICD-10-CM | POA: Diagnosis not present

## 2024-04-20 DIAGNOSIS — E782 Mixed hyperlipidemia: Secondary | ICD-10-CM | POA: Diagnosis not present

## 2024-04-20 DIAGNOSIS — E038 Other specified hypothyroidism: Secondary | ICD-10-CM | POA: Diagnosis not present

## 2024-04-20 DIAGNOSIS — E119 Type 2 diabetes mellitus without complications: Secondary | ICD-10-CM | POA: Diagnosis not present

## 2024-04-26 DIAGNOSIS — E119 Type 2 diabetes mellitus without complications: Secondary | ICD-10-CM | POA: Diagnosis not present

## 2024-04-26 DIAGNOSIS — I70223 Atherosclerosis of native arteries of extremities with rest pain, bilateral legs: Secondary | ICD-10-CM | POA: Diagnosis not present

## 2024-04-26 DIAGNOSIS — E782 Mixed hyperlipidemia: Secondary | ICD-10-CM | POA: Diagnosis not present

## 2024-04-26 DIAGNOSIS — E559 Vitamin D deficiency, unspecified: Secondary | ICD-10-CM | POA: Diagnosis not present

## 2024-04-26 DIAGNOSIS — E038 Other specified hypothyroidism: Secondary | ICD-10-CM | POA: Diagnosis not present

## 2024-05-05 DIAGNOSIS — E785 Hyperlipidemia, unspecified: Secondary | ICD-10-CM | POA: Diagnosis not present

## 2024-05-05 DIAGNOSIS — E119 Type 2 diabetes mellitus without complications: Secondary | ICD-10-CM | POA: Diagnosis not present

## 2024-05-05 DIAGNOSIS — K219 Gastro-esophageal reflux disease without esophagitis: Secondary | ICD-10-CM | POA: Diagnosis not present

## 2024-05-12 DIAGNOSIS — E785 Hyperlipidemia, unspecified: Secondary | ICD-10-CM | POA: Diagnosis not present

## 2024-05-12 DIAGNOSIS — E1165 Type 2 diabetes mellitus with hyperglycemia: Secondary | ICD-10-CM | POA: Diagnosis not present

## 2024-05-12 DIAGNOSIS — E119 Type 2 diabetes mellitus without complications: Secondary | ICD-10-CM | POA: Diagnosis not present

## 2024-05-13 DIAGNOSIS — I70223 Atherosclerosis of native arteries of extremities with rest pain, bilateral legs: Secondary | ICD-10-CM | POA: Diagnosis not present

## 2024-05-13 DIAGNOSIS — E559 Vitamin D deficiency, unspecified: Secondary | ICD-10-CM | POA: Diagnosis not present

## 2024-05-13 DIAGNOSIS — E038 Other specified hypothyroidism: Secondary | ICD-10-CM | POA: Diagnosis not present

## 2024-05-13 DIAGNOSIS — D519 Vitamin B12 deficiency anemia, unspecified: Secondary | ICD-10-CM | POA: Diagnosis not present

## 2024-05-13 DIAGNOSIS — E119 Type 2 diabetes mellitus without complications: Secondary | ICD-10-CM | POA: Diagnosis not present

## 2024-05-13 DIAGNOSIS — E782 Mixed hyperlipidemia: Secondary | ICD-10-CM | POA: Diagnosis not present

## 2024-05-18 DIAGNOSIS — R6889 Other general symptoms and signs: Secondary | ICD-10-CM | POA: Diagnosis not present

## 2024-05-18 DIAGNOSIS — E1122 Type 2 diabetes mellitus with diabetic chronic kidney disease: Secondary | ICD-10-CM | POA: Diagnosis not present

## 2024-05-18 DIAGNOSIS — I674 Hypertensive encephalopathy: Secondary | ICD-10-CM | POA: Diagnosis not present

## 2024-05-18 DIAGNOSIS — Z7985 Long-term (current) use of injectable non-insulin antidiabetic drugs: Secondary | ICD-10-CM | POA: Diagnosis not present

## 2024-05-18 DIAGNOSIS — Z7409 Other reduced mobility: Secondary | ICD-10-CM | POA: Diagnosis not present

## 2024-05-18 DIAGNOSIS — Z885 Allergy status to narcotic agent status: Secondary | ICD-10-CM | POA: Diagnosis not present

## 2024-05-18 DIAGNOSIS — Z888 Allergy status to other drugs, medicaments and biological substances status: Secondary | ICD-10-CM | POA: Diagnosis not present

## 2024-05-18 DIAGNOSIS — Z789 Other specified health status: Secondary | ICD-10-CM | POA: Diagnosis not present

## 2024-05-18 DIAGNOSIS — E119 Type 2 diabetes mellitus without complications: Secondary | ICD-10-CM | POA: Diagnosis not present

## 2024-05-18 DIAGNOSIS — R262 Difficulty in walking, not elsewhere classified: Secondary | ICD-10-CM | POA: Diagnosis not present

## 2024-05-18 DIAGNOSIS — R531 Weakness: Secondary | ICD-10-CM | POA: Diagnosis not present

## 2024-05-18 DIAGNOSIS — Z881 Allergy status to other antibiotic agents status: Secondary | ICD-10-CM | POA: Diagnosis not present

## 2024-05-18 DIAGNOSIS — G8190 Hemiplegia, unspecified affecting unspecified side: Secondary | ICD-10-CM | POA: Diagnosis not present

## 2024-05-18 DIAGNOSIS — D649 Anemia, unspecified: Secondary | ICD-10-CM | POA: Diagnosis not present

## 2024-05-18 DIAGNOSIS — Z79899 Other long term (current) drug therapy: Secondary | ICD-10-CM | POA: Diagnosis not present

## 2024-05-18 DIAGNOSIS — Z7984 Long term (current) use of oral hypoglycemic drugs: Secondary | ICD-10-CM | POA: Diagnosis not present

## 2024-05-18 DIAGNOSIS — G9389 Other specified disorders of brain: Secondary | ICD-10-CM | POA: Diagnosis not present

## 2024-05-18 DIAGNOSIS — G9340 Encephalopathy, unspecified: Secondary | ICD-10-CM | POA: Diagnosis not present

## 2024-05-18 DIAGNOSIS — G459 Transient cerebral ischemic attack, unspecified: Secondary | ICD-10-CM | POA: Diagnosis not present

## 2024-05-18 DIAGNOSIS — R29818 Other symptoms and signs involving the nervous system: Secondary | ICD-10-CM | POA: Diagnosis not present

## 2024-05-18 DIAGNOSIS — I471 Supraventricular tachycardia, unspecified: Secondary | ICD-10-CM | POA: Diagnosis not present

## 2024-05-18 DIAGNOSIS — Z743 Need for continuous supervision: Secondary | ICD-10-CM | POA: Diagnosis not present

## 2024-05-18 DIAGNOSIS — I10 Essential (primary) hypertension: Secondary | ICD-10-CM | POA: Diagnosis not present

## 2024-05-19 DIAGNOSIS — I674 Hypertensive encephalopathy: Secondary | ICD-10-CM | POA: Diagnosis not present

## 2024-05-19 DIAGNOSIS — E1122 Type 2 diabetes mellitus with diabetic chronic kidney disease: Secondary | ICD-10-CM | POA: Diagnosis not present

## 2024-05-19 DIAGNOSIS — I10 Essential (primary) hypertension: Secondary | ICD-10-CM | POA: Diagnosis not present

## 2024-05-20 DIAGNOSIS — E1122 Type 2 diabetes mellitus with diabetic chronic kidney disease: Secondary | ICD-10-CM | POA: Diagnosis not present

## 2024-05-20 DIAGNOSIS — I10 Essential (primary) hypertension: Secondary | ICD-10-CM | POA: Diagnosis not present

## 2024-05-20 DIAGNOSIS — I674 Hypertensive encephalopathy: Secondary | ICD-10-CM | POA: Diagnosis not present

## 2024-05-21 DIAGNOSIS — I10 Essential (primary) hypertension: Secondary | ICD-10-CM | POA: Diagnosis not present

## 2024-05-23 DIAGNOSIS — G3184 Mild cognitive impairment, so stated: Secondary | ICD-10-CM | POA: Diagnosis not present

## 2024-05-24 DIAGNOSIS — Z7985 Long-term (current) use of injectable non-insulin antidiabetic drugs: Secondary | ICD-10-CM | POA: Diagnosis not present

## 2024-05-24 DIAGNOSIS — Z7984 Long term (current) use of oral hypoglycemic drugs: Secondary | ICD-10-CM | POA: Diagnosis not present

## 2024-05-24 DIAGNOSIS — E119 Type 2 diabetes mellitus without complications: Secondary | ICD-10-CM | POA: Diagnosis not present

## 2024-05-24 DIAGNOSIS — R410 Disorientation, unspecified: Secondary | ICD-10-CM | POA: Diagnosis not present

## 2024-05-24 DIAGNOSIS — R6889 Other general symptoms and signs: Secondary | ICD-10-CM | POA: Diagnosis not present

## 2024-05-29 DIAGNOSIS — E119 Type 2 diabetes mellitus without complications: Secondary | ICD-10-CM | POA: Diagnosis not present

## 2024-05-29 DIAGNOSIS — E785 Hyperlipidemia, unspecified: Secondary | ICD-10-CM | POA: Diagnosis not present

## 2024-05-29 DIAGNOSIS — Z79899 Other long term (current) drug therapy: Secondary | ICD-10-CM | POA: Diagnosis not present

## 2024-05-29 DIAGNOSIS — D649 Anemia, unspecified: Secondary | ICD-10-CM | POA: Diagnosis not present

## 2024-05-29 DIAGNOSIS — I10 Essential (primary) hypertension: Secondary | ICD-10-CM | POA: Diagnosis not present

## 2024-05-29 DIAGNOSIS — Z743 Need for continuous supervision: Secondary | ICD-10-CM | POA: Diagnosis not present

## 2024-05-31 DIAGNOSIS — I471 Supraventricular tachycardia, unspecified: Secondary | ICD-10-CM | POA: Diagnosis not present

## 2024-06-06 DIAGNOSIS — E1165 Type 2 diabetes mellitus with hyperglycemia: Secondary | ICD-10-CM | POA: Diagnosis not present

## 2024-06-06 DIAGNOSIS — I674 Hypertensive encephalopathy: Secondary | ICD-10-CM | POA: Diagnosis not present

## 2024-06-06 DIAGNOSIS — E119 Type 2 diabetes mellitus without complications: Secondary | ICD-10-CM | POA: Diagnosis not present

## 2024-06-06 DIAGNOSIS — E785 Hyperlipidemia, unspecified: Secondary | ICD-10-CM | POA: Diagnosis not present

## 2024-06-06 DIAGNOSIS — K219 Gastro-esophageal reflux disease without esophagitis: Secondary | ICD-10-CM | POA: Diagnosis not present

## 2024-06-06 DIAGNOSIS — I10 Essential (primary) hypertension: Secondary | ICD-10-CM | POA: Diagnosis not present

## 2024-06-07 DIAGNOSIS — Z7985 Long-term (current) use of injectable non-insulin antidiabetic drugs: Secondary | ICD-10-CM | POA: Diagnosis not present

## 2024-06-07 DIAGNOSIS — I10 Essential (primary) hypertension: Secondary | ICD-10-CM | POA: Diagnosis not present

## 2024-06-07 DIAGNOSIS — D649 Anemia, unspecified: Secondary | ICD-10-CM | POA: Diagnosis not present

## 2024-06-07 DIAGNOSIS — G9340 Encephalopathy, unspecified: Secondary | ICD-10-CM | POA: Diagnosis not present

## 2024-06-07 DIAGNOSIS — I674 Hypertensive encephalopathy: Secondary | ICD-10-CM | POA: Diagnosis not present

## 2024-06-07 DIAGNOSIS — E119 Type 2 diabetes mellitus without complications: Secondary | ICD-10-CM | POA: Diagnosis not present

## 2024-06-07 DIAGNOSIS — G8194 Hemiplegia, unspecified affecting left nondominant side: Secondary | ICD-10-CM | POA: Diagnosis not present

## 2024-06-07 DIAGNOSIS — Z9181 History of falling: Secondary | ICD-10-CM | POA: Diagnosis not present

## 2024-06-07 DIAGNOSIS — I471 Supraventricular tachycardia, unspecified: Secondary | ICD-10-CM | POA: Diagnosis not present

## 2024-06-07 DIAGNOSIS — Z7984 Long term (current) use of oral hypoglycemic drugs: Secondary | ICD-10-CM | POA: Diagnosis not present

## 2024-06-09 DIAGNOSIS — I10 Essential (primary) hypertension: Secondary | ICD-10-CM | POA: Diagnosis not present

## 2024-06-09 DIAGNOSIS — R296 Repeated falls: Secondary | ICD-10-CM | POA: Diagnosis not present

## 2024-06-13 DIAGNOSIS — Z79899 Other long term (current) drug therapy: Secondary | ICD-10-CM | POA: Diagnosis not present

## 2024-06-14 DIAGNOSIS — E038 Other specified hypothyroidism: Secondary | ICD-10-CM | POA: Diagnosis not present

## 2024-06-14 DIAGNOSIS — E782 Mixed hyperlipidemia: Secondary | ICD-10-CM | POA: Diagnosis not present

## 2024-06-14 DIAGNOSIS — D519 Vitamin B12 deficiency anemia, unspecified: Secondary | ICD-10-CM | POA: Diagnosis not present

## 2024-06-14 DIAGNOSIS — E559 Vitamin D deficiency, unspecified: Secondary | ICD-10-CM | POA: Diagnosis not present

## 2024-06-14 DIAGNOSIS — E119 Type 2 diabetes mellitus without complications: Secondary | ICD-10-CM | POA: Diagnosis not present

## 2024-06-14 DIAGNOSIS — I70223 Atherosclerosis of native arteries of extremities with rest pain, bilateral legs: Secondary | ICD-10-CM | POA: Diagnosis not present

## 2024-06-16 DIAGNOSIS — G8194 Hemiplegia, unspecified affecting left nondominant side: Secondary | ICD-10-CM | POA: Diagnosis not present

## 2024-06-16 DIAGNOSIS — Z7984 Long term (current) use of oral hypoglycemic drugs: Secondary | ICD-10-CM | POA: Diagnosis not present

## 2024-06-16 DIAGNOSIS — I674 Hypertensive encephalopathy: Secondary | ICD-10-CM | POA: Diagnosis not present

## 2024-06-16 DIAGNOSIS — D649 Anemia, unspecified: Secondary | ICD-10-CM | POA: Diagnosis not present

## 2024-06-16 DIAGNOSIS — E119 Type 2 diabetes mellitus without complications: Secondary | ICD-10-CM | POA: Diagnosis not present

## 2024-06-16 DIAGNOSIS — Z7985 Long-term (current) use of injectable non-insulin antidiabetic drugs: Secondary | ICD-10-CM | POA: Diagnosis not present

## 2024-06-16 DIAGNOSIS — Z9181 History of falling: Secondary | ICD-10-CM | POA: Diagnosis not present

## 2024-06-16 DIAGNOSIS — I471 Supraventricular tachycardia, unspecified: Secondary | ICD-10-CM | POA: Diagnosis not present

## 2024-06-22 DIAGNOSIS — D649 Anemia, unspecified: Secondary | ICD-10-CM | POA: Diagnosis not present

## 2024-06-22 DIAGNOSIS — I471 Supraventricular tachycardia, unspecified: Secondary | ICD-10-CM | POA: Diagnosis not present

## 2024-06-22 DIAGNOSIS — Z9181 History of falling: Secondary | ICD-10-CM | POA: Diagnosis not present

## 2024-06-22 DIAGNOSIS — Z7985 Long-term (current) use of injectable non-insulin antidiabetic drugs: Secondary | ICD-10-CM | POA: Diagnosis not present

## 2024-06-22 DIAGNOSIS — E119 Type 2 diabetes mellitus without complications: Secondary | ICD-10-CM | POA: Diagnosis not present

## 2024-06-22 DIAGNOSIS — G8194 Hemiplegia, unspecified affecting left nondominant side: Secondary | ICD-10-CM | POA: Diagnosis not present

## 2024-06-22 DIAGNOSIS — Z7984 Long term (current) use of oral hypoglycemic drugs: Secondary | ICD-10-CM | POA: Diagnosis not present

## 2024-06-22 DIAGNOSIS — I674 Hypertensive encephalopathy: Secondary | ICD-10-CM | POA: Diagnosis not present

## 2024-06-23 DIAGNOSIS — I10 Essential (primary) hypertension: Secondary | ICD-10-CM | POA: Diagnosis not present

## 2024-06-23 DIAGNOSIS — E785 Hyperlipidemia, unspecified: Secondary | ICD-10-CM | POA: Diagnosis not present

## 2024-06-23 DIAGNOSIS — E1165 Type 2 diabetes mellitus with hyperglycemia: Secondary | ICD-10-CM | POA: Diagnosis not present

## 2024-06-23 DIAGNOSIS — Z0000 Encounter for general adult medical examination without abnormal findings: Secondary | ICD-10-CM | POA: Diagnosis not present

## 2024-06-26 DIAGNOSIS — I10 Essential (primary) hypertension: Secondary | ICD-10-CM | POA: Diagnosis not present

## 2024-06-26 DIAGNOSIS — Z760 Encounter for issue of repeat prescription: Secondary | ICD-10-CM | POA: Diagnosis not present

## 2024-06-29 DIAGNOSIS — G8194 Hemiplegia, unspecified affecting left nondominant side: Secondary | ICD-10-CM | POA: Diagnosis not present

## 2024-06-29 DIAGNOSIS — Z7985 Long-term (current) use of injectable non-insulin antidiabetic drugs: Secondary | ICD-10-CM | POA: Diagnosis not present

## 2024-06-29 DIAGNOSIS — I674 Hypertensive encephalopathy: Secondary | ICD-10-CM | POA: Diagnosis not present

## 2024-06-29 DIAGNOSIS — Z7984 Long term (current) use of oral hypoglycemic drugs: Secondary | ICD-10-CM | POA: Diagnosis not present

## 2024-06-29 DIAGNOSIS — I471 Supraventricular tachycardia, unspecified: Secondary | ICD-10-CM | POA: Diagnosis not present

## 2024-06-29 DIAGNOSIS — E119 Type 2 diabetes mellitus without complications: Secondary | ICD-10-CM | POA: Diagnosis not present

## 2024-06-29 DIAGNOSIS — D649 Anemia, unspecified: Secondary | ICD-10-CM | POA: Diagnosis not present

## 2024-06-29 DIAGNOSIS — Z9181 History of falling: Secondary | ICD-10-CM | POA: Diagnosis not present

## 2024-07-04 DIAGNOSIS — G3184 Mild cognitive impairment, so stated: Secondary | ICD-10-CM | POA: Diagnosis not present

## 2024-07-05 DIAGNOSIS — Z9181 History of falling: Secondary | ICD-10-CM | POA: Diagnosis not present

## 2024-07-05 DIAGNOSIS — I471 Supraventricular tachycardia, unspecified: Secondary | ICD-10-CM | POA: Diagnosis not present

## 2024-07-05 DIAGNOSIS — Z7985 Long-term (current) use of injectable non-insulin antidiabetic drugs: Secondary | ICD-10-CM | POA: Diagnosis not present

## 2024-07-05 DIAGNOSIS — E119 Type 2 diabetes mellitus without complications: Secondary | ICD-10-CM | POA: Diagnosis not present

## 2024-07-05 DIAGNOSIS — G8194 Hemiplegia, unspecified affecting left nondominant side: Secondary | ICD-10-CM | POA: Diagnosis not present

## 2024-07-05 DIAGNOSIS — D649 Anemia, unspecified: Secondary | ICD-10-CM | POA: Diagnosis not present

## 2024-07-05 DIAGNOSIS — Z7984 Long term (current) use of oral hypoglycemic drugs: Secondary | ICD-10-CM | POA: Diagnosis not present

## 2024-07-05 DIAGNOSIS — I674 Hypertensive encephalopathy: Secondary | ICD-10-CM | POA: Diagnosis not present

## 2024-07-07 DIAGNOSIS — E782 Mixed hyperlipidemia: Secondary | ICD-10-CM | POA: Diagnosis not present

## 2024-07-07 DIAGNOSIS — Z79899 Other long term (current) drug therapy: Secondary | ICD-10-CM | POA: Diagnosis not present

## 2024-07-07 DIAGNOSIS — D519 Vitamin B12 deficiency anemia, unspecified: Secondary | ICD-10-CM | POA: Diagnosis not present

## 2024-07-07 DIAGNOSIS — E038 Other specified hypothyroidism: Secondary | ICD-10-CM | POA: Diagnosis not present

## 2024-07-07 DIAGNOSIS — E119 Type 2 diabetes mellitus without complications: Secondary | ICD-10-CM | POA: Diagnosis not present

## 2024-07-11 DIAGNOSIS — G3184 Mild cognitive impairment, so stated: Secondary | ICD-10-CM | POA: Diagnosis not present

## 2024-07-12 DIAGNOSIS — E119 Type 2 diabetes mellitus without complications: Secondary | ICD-10-CM | POA: Diagnosis not present

## 2024-07-12 DIAGNOSIS — E782 Mixed hyperlipidemia: Secondary | ICD-10-CM | POA: Diagnosis not present

## 2024-07-12 DIAGNOSIS — E038 Other specified hypothyroidism: Secondary | ICD-10-CM | POA: Diagnosis not present

## 2024-07-12 DIAGNOSIS — E559 Vitamin D deficiency, unspecified: Secondary | ICD-10-CM | POA: Diagnosis not present

## 2024-07-12 DIAGNOSIS — D519 Vitamin B12 deficiency anemia, unspecified: Secondary | ICD-10-CM | POA: Diagnosis not present

## 2024-07-14 DIAGNOSIS — E1165 Type 2 diabetes mellitus with hyperglycemia: Secondary | ICD-10-CM | POA: Diagnosis not present

## 2024-07-14 DIAGNOSIS — K219 Gastro-esophageal reflux disease without esophagitis: Secondary | ICD-10-CM | POA: Diagnosis not present

## 2024-07-14 DIAGNOSIS — I10 Essential (primary) hypertension: Secondary | ICD-10-CM | POA: Diagnosis not present

## 2024-07-14 DIAGNOSIS — E785 Hyperlipidemia, unspecified: Secondary | ICD-10-CM | POA: Diagnosis not present

## 2024-07-19 DIAGNOSIS — Z7985 Long-term (current) use of injectable non-insulin antidiabetic drugs: Secondary | ICD-10-CM | POA: Diagnosis not present

## 2024-07-19 DIAGNOSIS — I674 Hypertensive encephalopathy: Secondary | ICD-10-CM | POA: Diagnosis not present

## 2024-07-19 DIAGNOSIS — Z7984 Long term (current) use of oral hypoglycemic drugs: Secondary | ICD-10-CM | POA: Diagnosis not present

## 2024-07-19 DIAGNOSIS — E119 Type 2 diabetes mellitus without complications: Secondary | ICD-10-CM | POA: Diagnosis not present

## 2024-07-19 DIAGNOSIS — D649 Anemia, unspecified: Secondary | ICD-10-CM | POA: Diagnosis not present

## 2024-07-19 DIAGNOSIS — I471 Supraventricular tachycardia, unspecified: Secondary | ICD-10-CM | POA: Diagnosis not present

## 2024-07-19 DIAGNOSIS — Z9181 History of falling: Secondary | ICD-10-CM | POA: Diagnosis not present

## 2024-07-19 DIAGNOSIS — G8194 Hemiplegia, unspecified affecting left nondominant side: Secondary | ICD-10-CM | POA: Diagnosis not present

## 2024-08-01 DIAGNOSIS — I471 Supraventricular tachycardia, unspecified: Secondary | ICD-10-CM | POA: Diagnosis not present

## 2024-08-01 DIAGNOSIS — Z9181 History of falling: Secondary | ICD-10-CM | POA: Diagnosis not present

## 2024-08-01 DIAGNOSIS — Z7985 Long-term (current) use of injectable non-insulin antidiabetic drugs: Secondary | ICD-10-CM | POA: Diagnosis not present

## 2024-08-01 DIAGNOSIS — G8194 Hemiplegia, unspecified affecting left nondominant side: Secondary | ICD-10-CM | POA: Diagnosis not present

## 2024-08-01 DIAGNOSIS — Z7984 Long term (current) use of oral hypoglycemic drugs: Secondary | ICD-10-CM | POA: Diagnosis not present

## 2024-08-01 DIAGNOSIS — E119 Type 2 diabetes mellitus without complications: Secondary | ICD-10-CM | POA: Diagnosis not present

## 2024-08-01 DIAGNOSIS — D649 Anemia, unspecified: Secondary | ICD-10-CM | POA: Diagnosis not present

## 2024-08-01 DIAGNOSIS — I674 Hypertensive encephalopathy: Secondary | ICD-10-CM | POA: Diagnosis not present

## 2024-08-10 DIAGNOSIS — K219 Gastro-esophageal reflux disease without esophagitis: Secondary | ICD-10-CM | POA: Diagnosis not present

## 2024-08-10 DIAGNOSIS — E785 Hyperlipidemia, unspecified: Secondary | ICD-10-CM | POA: Diagnosis not present

## 2024-08-10 DIAGNOSIS — I10 Essential (primary) hypertension: Secondary | ICD-10-CM | POA: Diagnosis not present

## 2024-08-10 DIAGNOSIS — E119 Type 2 diabetes mellitus without complications: Secondary | ICD-10-CM | POA: Diagnosis not present

## 2024-08-11 DIAGNOSIS — E559 Vitamin D deficiency, unspecified: Secondary | ICD-10-CM | POA: Diagnosis not present

## 2024-08-11 DIAGNOSIS — L89302 Pressure ulcer of unspecified buttock, stage 2: Secondary | ICD-10-CM | POA: Diagnosis not present

## 2024-08-11 DIAGNOSIS — E119 Type 2 diabetes mellitus without complications: Secondary | ICD-10-CM | POA: Diagnosis not present

## 2024-08-11 DIAGNOSIS — E038 Other specified hypothyroidism: Secondary | ICD-10-CM | POA: Diagnosis not present

## 2024-08-11 DIAGNOSIS — E782 Mixed hyperlipidemia: Secondary | ICD-10-CM | POA: Diagnosis not present

## 2024-08-11 DIAGNOSIS — D519 Vitamin B12 deficiency anemia, unspecified: Secondary | ICD-10-CM | POA: Diagnosis not present

## 2024-08-14 DIAGNOSIS — E785 Hyperlipidemia, unspecified: Secondary | ICD-10-CM | POA: Diagnosis not present

## 2024-08-14 DIAGNOSIS — I10 Essential (primary) hypertension: Secondary | ICD-10-CM | POA: Diagnosis not present

## 2024-08-14 DIAGNOSIS — L89302 Pressure ulcer of unspecified buttock, stage 2: Secondary | ICD-10-CM | POA: Diagnosis not present

## 2024-08-14 DIAGNOSIS — K219 Gastro-esophageal reflux disease without esophagitis: Secondary | ICD-10-CM | POA: Diagnosis not present

## 2024-08-14 DIAGNOSIS — E119 Type 2 diabetes mellitus without complications: Secondary | ICD-10-CM | POA: Diagnosis not present

## 2024-08-15 DIAGNOSIS — I471 Supraventricular tachycardia, unspecified: Secondary | ICD-10-CM | POA: Diagnosis not present

## 2024-08-15 DIAGNOSIS — D649 Anemia, unspecified: Secondary | ICD-10-CM | POA: Diagnosis not present

## 2024-08-15 DIAGNOSIS — Z9181 History of falling: Secondary | ICD-10-CM | POA: Diagnosis not present

## 2024-08-15 DIAGNOSIS — Z7985 Long-term (current) use of injectable non-insulin antidiabetic drugs: Secondary | ICD-10-CM | POA: Diagnosis not present

## 2024-08-15 DIAGNOSIS — Z7984 Long term (current) use of oral hypoglycemic drugs: Secondary | ICD-10-CM | POA: Diagnosis not present

## 2024-08-15 DIAGNOSIS — E119 Type 2 diabetes mellitus without complications: Secondary | ICD-10-CM | POA: Diagnosis not present

## 2024-08-15 DIAGNOSIS — L89320 Pressure ulcer of left buttock, unstageable: Secondary | ICD-10-CM | POA: Diagnosis not present

## 2024-08-17 DIAGNOSIS — Z7984 Long term (current) use of oral hypoglycemic drugs: Secondary | ICD-10-CM | POA: Diagnosis not present

## 2024-08-17 DIAGNOSIS — D649 Anemia, unspecified: Secondary | ICD-10-CM | POA: Diagnosis not present

## 2024-08-17 DIAGNOSIS — E119 Type 2 diabetes mellitus without complications: Secondary | ICD-10-CM | POA: Diagnosis not present

## 2024-08-17 DIAGNOSIS — L89320 Pressure ulcer of left buttock, unstageable: Secondary | ICD-10-CM | POA: Diagnosis not present

## 2024-08-17 DIAGNOSIS — Z9181 History of falling: Secondary | ICD-10-CM | POA: Diagnosis not present

## 2024-08-17 DIAGNOSIS — Z7985 Long-term (current) use of injectable non-insulin antidiabetic drugs: Secondary | ICD-10-CM | POA: Diagnosis not present

## 2024-08-17 DIAGNOSIS — I471 Supraventricular tachycardia, unspecified: Secondary | ICD-10-CM | POA: Diagnosis not present

## 2024-08-21 DIAGNOSIS — I471 Supraventricular tachycardia, unspecified: Secondary | ICD-10-CM | POA: Diagnosis not present

## 2024-08-21 DIAGNOSIS — Z9181 History of falling: Secondary | ICD-10-CM | POA: Diagnosis not present

## 2024-08-21 DIAGNOSIS — L89320 Pressure ulcer of left buttock, unstageable: Secondary | ICD-10-CM | POA: Diagnosis not present

## 2024-08-21 DIAGNOSIS — Z7985 Long-term (current) use of injectable non-insulin antidiabetic drugs: Secondary | ICD-10-CM | POA: Diagnosis not present

## 2024-08-21 DIAGNOSIS — D649 Anemia, unspecified: Secondary | ICD-10-CM | POA: Diagnosis not present

## 2024-08-21 DIAGNOSIS — Z7984 Long term (current) use of oral hypoglycemic drugs: Secondary | ICD-10-CM | POA: Diagnosis not present

## 2024-08-21 DIAGNOSIS — E119 Type 2 diabetes mellitus without complications: Secondary | ICD-10-CM | POA: Diagnosis not present

## 2024-08-24 DIAGNOSIS — R197 Diarrhea, unspecified: Secondary | ICD-10-CM | POA: Diagnosis not present

## 2024-08-25 DIAGNOSIS — L89320 Pressure ulcer of left buttock, unstageable: Secondary | ICD-10-CM | POA: Diagnosis not present

## 2024-08-25 DIAGNOSIS — Z9181 History of falling: Secondary | ICD-10-CM | POA: Diagnosis not present

## 2024-08-25 DIAGNOSIS — Z7985 Long-term (current) use of injectable non-insulin antidiabetic drugs: Secondary | ICD-10-CM | POA: Diagnosis not present

## 2024-08-25 DIAGNOSIS — E119 Type 2 diabetes mellitus without complications: Secondary | ICD-10-CM | POA: Diagnosis not present

## 2024-08-25 DIAGNOSIS — Z7984 Long term (current) use of oral hypoglycemic drugs: Secondary | ICD-10-CM | POA: Diagnosis not present

## 2024-08-25 DIAGNOSIS — D649 Anemia, unspecified: Secondary | ICD-10-CM | POA: Diagnosis not present

## 2024-08-25 DIAGNOSIS — I471 Supraventricular tachycardia, unspecified: Secondary | ICD-10-CM | POA: Diagnosis not present
